# Patient Record
Sex: Female | Born: 2000 | Race: White | Hispanic: No | Marital: Single | Smoking: Never smoker
Health system: Southern US, Community
[De-identification: ages and names within clinical notes are randomized; demographics above are authoritative.]

## PROBLEM LIST (undated history)

## (undated) DIAGNOSIS — F909 Attention-deficit hyperactivity disorder, unspecified type: Secondary | ICD-10-CM

## (undated) DIAGNOSIS — F419 Anxiety disorder, unspecified: Secondary | ICD-10-CM

## (undated) DIAGNOSIS — G90A Postural orthostatic tachycardia syndrome (POTS): Secondary | ICD-10-CM

## (undated) DIAGNOSIS — K219 Gastro-esophageal reflux disease without esophagitis: Secondary | ICD-10-CM

## (undated) DIAGNOSIS — A048 Other specified bacterial intestinal infections: Secondary | ICD-10-CM

## (undated) DIAGNOSIS — Q796 Ehlers-Danlos syndrome, unspecified: Secondary | ICD-10-CM

## (undated) DIAGNOSIS — R011 Cardiac murmur, unspecified: Secondary | ICD-10-CM

## (undated) DIAGNOSIS — D649 Anemia, unspecified: Secondary | ICD-10-CM

## (undated) DIAGNOSIS — E039 Hypothyroidism, unspecified: Secondary | ICD-10-CM

## (undated) DIAGNOSIS — H539 Unspecified visual disturbance: Secondary | ICD-10-CM

## (undated) DIAGNOSIS — I2541 Coronary artery aneurysm: Secondary | ICD-10-CM

## (undated) DIAGNOSIS — J45909 Unspecified asthma, uncomplicated: Secondary | ICD-10-CM

## (undated) DIAGNOSIS — F32A Depression, unspecified: Secondary | ICD-10-CM

## (undated) DIAGNOSIS — E221 Hyperprolactinemia: Secondary | ICD-10-CM

## (undated) DIAGNOSIS — T7840XA Allergy, unspecified, initial encounter: Secondary | ICD-10-CM

## (undated) DIAGNOSIS — I253 Aneurysm of heart: Secondary | ICD-10-CM

## (undated) DIAGNOSIS — L509 Urticaria, unspecified: Secondary | ICD-10-CM

## (undated) DIAGNOSIS — R000 Tachycardia, unspecified: Secondary | ICD-10-CM

## (undated) DIAGNOSIS — I951 Orthostatic hypotension: Secondary | ICD-10-CM

## (undated) DIAGNOSIS — F329 Major depressive disorder, single episode, unspecified: Secondary | ICD-10-CM

## (undated) DIAGNOSIS — I498 Other specified cardiac arrhythmias: Secondary | ICD-10-CM

## (undated) HISTORY — DX: Urticaria, unspecified: L50.9

## (undated) HISTORY — PX: MOUTH SURGERY: SHX715

## (undated) HISTORY — PX: HYMENECTOMY: SHX987

---
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
## 2018-01-04 ENCOUNTER — Ambulatory Visit: Payer: 59 | Attending: Orthopedic Surgery | Admitting: Physical Therapy

## 2018-01-04 ENCOUNTER — Encounter: Payer: Self-pay | Admitting: Physical Therapy

## 2018-01-04 DIAGNOSIS — M62830 Muscle spasm of back: Secondary | ICD-10-CM | POA: Diagnosis not present

## 2018-01-04 DIAGNOSIS — M546 Pain in thoracic spine: Secondary | ICD-10-CM | POA: Diagnosis not present

## 2018-01-04 DIAGNOSIS — M542 Cervicalgia: Secondary | ICD-10-CM

## 2018-01-04 NOTE — Therapy (Signed)
[place], [place], [address] Phone: [phone]   Fax:  [phone]  Physical Therapy Treatment  Patient Details  Name: Christine [name] MRN: [id number] Date of Birth: February 01, 2001 Referring Provider: Lynann Bologna   Encounter Date: 01/04/2018  PT End of Session - 01/04/18 0841    Visit Number  3    Date for PT Re-Evaluation  02/07/18    PT Start Time  0800    PT Stop Time  0856    PT Time Calculation (min)  56 min    Activity Tolerance  Patient tolerated treatment well    Behavior During Therapy  [facility] for tasks assessed/performed       Past Medical History:  Diagnosis Date  . ADHD   . Allergy   . Anemia    reports history of slight anemia  . Anxiety   . Asthma   . Coronary artery fistula    by echo per note 12/23/16 at [facility]  . Depression   . Ehlers-Danlos syndrome    diagnosed x61monthago  . GERD (gastroesophageal reflux disease)   . H. pylori infection   . Heart murmur   . [place]   . Hypothyroidism   . POTS (postural orthostatic tachycardia syndrome)    mother reports that this was mainly when she was younger  . Vision abnormalities     Past Surgical History:  Procedure Laterality Date  . MOUTH SURGERY      There were no vitals filed for this visit.  Subjective Assessment - 01/04/18 0802    Subjective  "My shoulders hurt kinda"    Currently in Pain?  Yes    Pain Score  3     Pain Location  Shoulder and bottom of neck    Pain Orientation  Right                       [facility] Adult PT Treatment/Exercise - 01/04/18 0001      Neck Exercises: Machines for Strengthening   UBE (Upper Arm Bike)  L1 396f/3rev      Neck Exercises: Standing   Other Standing Exercises  shoulder ER yellow tband 2x10     Other Standing Exercises  shoulder flex 1lb 2x10      Neck Exercises: Seated   Shoulder Shrugs  20 reps 2sec hold     Other  Seated Exercise  ab sets with Pball 2x10 2sec hold     Other Seated Exercise  OHP 2lb 3x5       Modalities   Modalities  Electrical Stimulation;Moist Heat      Electrical Stimulation   Electrical Stimulation Location  upper thoracic area    Electrical Stimulation Action  IFC    Electrical Stimulation Parameters  supine    Electrical Stimulation Goals  Pain      Manual Therapy   Soft tissue mobilization  poisterior cervical para spinales    Manual Traction  cervical spine 3x10''               PT Short Term Goals - 12/08/17 1645      PT SHORT TERM GOAL #1   Title  independent with initial HEP    Time  2    Period  Weeks    Status  New        PT Long Term Goals - 01/04/18 0844      PT LONG  TERM GOAL #1   Title  decrease pain 50%    Status  Partially Met      PT LONG TERM GOAL #2   Title  understand proper posture and body mechanics    Status  Partially Met      PT LONG TERM GOAL #3   Title  demonstrate good sitting posture for 5 minutes    Status  On-going      PT LONG TERM GOAL #4   Title  increase cervical ROM to WNL's    Status  On-going            Plan - 01/04/18 0842    Clinical Impression Statement  Pt is very deconditioned and demos core weakness. Increase in lumbar lordosis with standing shoulder flexion, cues given to stand erect but unable to do. No increase in pain throughout only increase fatigue with light resistance. Pliable muscles noted  during MT.     Rehab Potential  Good    PT Frequency  2x / week    PT Duration  8 weeks    PT Treatment/Interventions  ADLs/Self Care Home Management;Electrical Stimulation;Moist Heat;Therapeutic exercise;Therapeutic activities;Patient/family education;Manual techniques    PT Next Visit Plan  slowly start stabilization exercises       Patient will benefit from skilled therapeutic intervention in order to improve the following deficits and impairments:  Decreased range of motion, Increased muscle  spasms, Pain, Impaired flexibility, Decreased strength, Postural dysfunction  Visit Diagnosis: Cervicalgia  Pain in thoracic spine  Muscle spasm of back     Problem List Patient Active Problem List   Diagnosis Date Noted  . Tension headache 06/05/2014  . Migraine without aura and without status migrainosus, not intractable 06/05/2014  . Generalized anxiety disorder 06/05/2014  . MDD (major depressive disorder), single episode, severe ([place]) 07/04/2013  . GAD (generalized anxiety disorder) 07/04/2013    Scot Jun, PTA 01/04/2018, 8:45 AM  [place], [place], [address] Phone: [phone]   Fax:  [phone]  Name: Romayne Ticas MRN: [id number] Date of Birth: [birth year]

## 2018-01-05 ENCOUNTER — Ambulatory Visit: Payer: 59 | Admitting: Physical Therapy

## 2018-01-05 DIAGNOSIS — K900 Celiac disease: Secondary | ICD-10-CM | POA: Diagnosis not present

## 2018-01-05 DIAGNOSIS — Z713 Dietary counseling and surveillance: Secondary | ICD-10-CM | POA: Diagnosis not present

## 2018-01-05 DIAGNOSIS — R768 Other specified abnormal immunological findings in serum: Secondary | ICD-10-CM | POA: Diagnosis not present

## 2018-01-11 ENCOUNTER — Ambulatory Visit: Payer: 59 | Admitting: Physical Therapy

## 2018-01-11 ENCOUNTER — Encounter: Payer: Self-pay | Admitting: Physical Therapy

## 2018-01-11 DIAGNOSIS — M542 Cervicalgia: Secondary | ICD-10-CM | POA: Diagnosis not present

## 2018-01-11 DIAGNOSIS — M546 Pain in thoracic spine: Secondary | ICD-10-CM

## 2018-01-11 NOTE — Therapy (Signed)
[place], [place], [address] Phone: [phone]   Fax:  [phone]  Physical Therapy Treatment  Patient Details  Name: Christine [name] MRN: [id number] Date of Birth: [birth year] Referring Provider: Lynann Bologna   Encounter Date: 01/11/2018  PT End of Session - 01/11/18 1645    Visit Number  4    Date for PT Re-Evaluation  02/07/18    PT Start Time  1600    PT Stop Time  1655    PT Time Calculation (min)  55 min    Activity Tolerance  Patient tolerated treatment well    Behavior During Therapy  [facility] for tasks assessed/performed       Past Medical History:  Diagnosis Date  . ADHD   . Allergy   . Anemia    reports history of slight anemia  . Anxiety   . Asthma   . Coronary artery fistula    by echo per note 12/23/16 at [facility]  . Depression   . Ehlers-Danlos syndrome    diagnosed x10monthago  . GERD (gastroesophageal reflux disease)   . H. pylori infection   . Heart murmur   . [place]   . Hypothyroidism   . POTS (postural orthostatic tachycardia syndrome)    mother reports that this was mainly when she was younger  . Vision abnormalities     Past Surgical History:  Procedure Laterality Date  . MOUTH SURGERY      There were no vitals filed for this visit.  Subjective Assessment - 01/11/18 1603    Subjective  "My shoulders are achy"    Currently in Pain?  Yes    Pain Score  4     Pain Location  Shoulder    Pain Orientation  Right                       [facility] Adult PT Treatment/Exercise - 01/11/18 0001      Neck Exercises: Machines for Strengthening   UBE (Upper Arm Bike)  L1 394f/3rev    Other Machines for Strengthening  Rows & lats 10lb 2x10       Neck Exercises: Standing   Other Standing Exercises  straight arm pull downs 5lb 2x10    Other Standing Exercises  Rows 10lb 2x10       Neck Exercises: Seated   Shoulder  Shrugs  20 reps 2 sec, 3lb      Modalities   Modalities  Electrical Stimulation;Moist Heat      Moist Heat Therapy   Number Minutes Moist Heat  15 Minutes    Moist Heat Location  Cervical      Electrical Stimulation   Electrical Stimulation Location  upper thoracic area    Electrical Stimulation Action  IFC    Electrical Stimulation Parameters  supine    Electrical Stimulation Goals  Pain               PT Short Term Goals - 12/08/17 1645      PT SHORT TERM GOAL #1   Title  independent with initial HEP    Time  2    Period  Weeks    Status  New        PT Long Term Goals - 01/04/18 0844      PT LONG TERM GOAL #1   Title  decrease pain 50%    Status  Partially Met  PT LONG TERM GOAL #2   Title  understand proper posture and body mechanics    Status  Partially Met      PT LONG TERM GOAL #3   Title  demonstrate good sitting posture for 5 minutes    Status  On-going      PT LONG TERM GOAL #4   Title  increase cervical ROM to WNL's    Status  On-going            Plan - 01/11/18 1646    Clinical Impression Statement  patient enters clinic reporting increase fatigue. She is very weak and deconditioned. Forward rounded shoulders. Constant postural verbal and tactile cues required throughout session. She was able to perform all of today's exercises with very little resistance. Demos core weakness with straight arm pull downs.     Rehab Potential  Good    PT Frequency  2x / week    PT Treatment/Interventions  ADLs/Self Care Home Management;Electrical Stimulation;Moist Heat;Therapeutic exercise;Therapeutic activities;Patient/family education;Manual techniques    PT Next Visit Plan   stabilization exercises       Patient will benefit from skilled therapeutic intervention in order to improve the following deficits and impairments:  Decreased range of motion, Increased muscle spasms, Pain, Impaired flexibility, Decreased strength, Postural  dysfunction  Visit Diagnosis: Cervicalgia  Pain in thoracic spine     Problem List Patient Active Problem List   Diagnosis Date Noted  . Tension headache 06/05/2014  . Migraine without aura and without status migrainosus, not intractable 06/05/2014  . Generalized anxiety disorder 06/05/2014  . MDD (major depressive disorder), single episode, severe ([place]) 07/04/2013  . GAD (generalized anxiety disorder) 07/04/2013    Scot Jun, PTA 01/11/2018, 4:49 PM  [place], [place], [address] Phone: [phone]   Fax:  [phone]  Name: Christine [name] MRN: [id number] Date of Birth: [birth year]

## 2018-01-12 DIAGNOSIS — J45909 Unspecified asthma, uncomplicated: Secondary | ICD-10-CM | POA: Diagnosis not present

## 2018-01-18 ENCOUNTER — Ambulatory Visit (INDEPENDENT_AMBULATORY_CARE_PROVIDER_SITE_OTHER): Payer: 59 | Admitting: Allergy and Immunology

## 2018-01-18 ENCOUNTER — Ambulatory Visit: Payer: 59 | Admitting: Physical Therapy

## 2018-01-18 ENCOUNTER — Encounter: Payer: Self-pay | Admitting: Allergy and Immunology

## 2018-01-18 VITALS — BP 90/60 | HR 100 | Temp 98.5°F | Resp 20 | Ht 65.1 in | Wt 106.2 lb

## 2018-01-18 DIAGNOSIS — J4541 Moderate persistent asthma with (acute) exacerbation: Secondary | ICD-10-CM

## 2018-01-18 DIAGNOSIS — T7800XD Anaphylactic reaction due to unspecified food, subsequent encounter: Secondary | ICD-10-CM

## 2018-01-18 DIAGNOSIS — J4550 Severe persistent asthma, uncomplicated: Secondary | ICD-10-CM | POA: Insufficient documentation

## 2018-01-18 DIAGNOSIS — T7800XA Anaphylactic reaction due to unspecified food, initial encounter: Secondary | ICD-10-CM | POA: Insufficient documentation

## 2018-01-18 DIAGNOSIS — H1013 Acute atopic conjunctivitis, bilateral: Secondary | ICD-10-CM | POA: Diagnosis not present

## 2018-01-18 DIAGNOSIS — H1010 Acute atopic conjunctivitis, unspecified eye: Secondary | ICD-10-CM | POA: Insufficient documentation

## 2018-01-18 DIAGNOSIS — J3089 Other allergic rhinitis: Secondary | ICD-10-CM | POA: Diagnosis not present

## 2018-01-18 MED ORDER — AZELASTINE HCL 0.15 % NA SOLN: 1.0000 | Freq: Two times a day (BID) | NASAL | 5 refills | Status: DC

## 2018-01-18 MED ORDER — EPINEPHRINE 0.3 MG/0.3ML IJ SOAJ: 0.3000 mg | Freq: Once | INTRAMUSCULAR | 2 refills | Status: AC

## 2018-01-18 MED ORDER — PREDNISOLONE SODIUM PHOSPHATE 25 MG/5ML PO SOLN: ORAL | 0 refills | Status: DC

## 2018-01-18 MED ORDER — BUDESONIDE-FORMOTEROL FUMARATE 160-4.5 MCG/ACT IN AERO: 2.0000 | INHALATION_SPRAY | Freq: Two times a day (BID) | RESPIRATORY_TRACT | 5 refills | Status: DC

## 2018-01-18 MED ORDER — OLOPATADINE HCL 0.2 % OP SOLN: 1.0000 [drp] | OPHTHALMIC | 5 refills | Status: DC

## 2018-01-18 MED ORDER — CARBINOXAMINE MALEATE 4 MG PO TABS: 4.0000 mg | ORAL_TABLET | Freq: Four times a day (QID) | ORAL | 0 refills | Status: DC

## 2018-01-18 NOTE — Patient Instructions (Addendum)
Perennial and seasonal allergic rhinitis  Aeroallergen avoidance measures have been discussed and provided in written form.  A prescription has been provided for carbinoxamine 4 mg every 6-8 hours if needed.  A prescription has been provided for azelastine nasal spray, 1-2 sprays per nostril 2 times daily as needed. Proper nasal spray technique has been discussed and demonstrated.   Nasal saline spray (i.e., Simply Saline) or nasal saline lavage (i.e., NeilMed) is recommended as needed and prior to medicated nasal sprays.  If allergen avoidance measures and medications fail to adequately relieve symptoms, aeroallergen immunotherapy will be considered.  Allergic conjunctivitis  Treatment plan as outlined above for allergic rhinitis.  A prescription has been provided for Pataday, one drop per eye daily as needed.  I have also recommended eye lubricant drops (i.e., Natural Tears) as needed.  Moderate persistent asthma A prescription has been provided for prednisolone 15 mg/5 mL; 5 mL 3 times a day 3 days, then 5 mL twice a day on day 4, then 5 mL on day 5, then stop.  A prescription has been provided for Symbicort (budesonide/formoterol) 160/4.5 g,  2 inhalations twice a day. To maximize pulmonary deposition, a spacer has been provided along with instructions for its proper administration with an HFA inhaler.  Continue albuterol HFA, 1 to 2 inhalations every 6 hours if needed.  The patient has been asked to contact me if her symptoms persist or progress. Otherwise, she may return for follow up in 3 months.  Food allergy The patient's history suggests food allergy and positive skin test results today confirm this diagnosis.  Meticulous avoidance of tree nuts and coconut as discussed.  A prescription has been provided for epinephrine auto-injector 2 pack along with instructions for proper administration.  A food allergy action plan has been provided and discussed.  Medic Alert  identification is recommended.   Return in about 8 weeks (around 03/15/2018), or if symptoms worsen or fail to improve.  Reducing Pollen Exposure  The American Academy of Allergy, Asthma and Immunology suggests the following steps to reduce your exposure to pollen during allergy seasons.    1. Do not hang sheets or clothing out to dry; pollen may collect on these items. 2. Do not mow lawns or spend time around freshly cut grass; mowing stirs up pollen. 3. Keep windows closed at night.  Keep car windows closed while driving. 4. Minimize morning activities outdoors, a time when pollen counts are usually at their highest. 5. Stay indoors as much as possible when pollen counts or humidity is high and on windy days when pollen tends to remain in the air longer. 6. Use air conditioning when possible.  Many air conditioners have filters that trap the pollen spores. 7. Use a HEPA room air filter to remove pollen form the indoor air you breathe.   Control of House Dust Mite Allergen  House dust mites play a major role in allergic asthma and rhinitis.  They occur in environments with high humidity wherever human skin, the food for dust mites is found. High levels have been detected in dust obtained from mattresses, pillows, carpets, upholstered furniture, bed covers, clothes and soft toys.  The principal allergen of the house dust mite is found in its feces.  A gram of dust may contain 1,000 mites and 250,000 fecal particles.  Mite antigen is easily measured in the air during house cleaning activities.    1. Encase mattresses, including the box spring, and pillow, in an air tight cover.  Seal the zipper end of the encased mattresses with wide adhesive tape. 2. Wash the bedding in water of 130 degrees Farenheit weekly.  Avoid cotton comforters/quilts and flannel bedding: the most ideal bed covering is the dacron comforter. 3. Remove all upholstered furniture from the bedroom. 4. Remove carpets, carpet  padding, rugs, and non-washable window drapes from the bedroom.  Wash drapes weekly or use plastic window coverings. 5. Remove all non-washable stuffed toys from the bedroom.  Wash stuffed toys weekly. 6. Have the room cleaned frequently with a vacuum cleaner and a damp dust-mop.  The patient should not be in a room which is being cleaned and should wait 1 hour after cleaning before going into the room. 7. Close and seal all heating outlets in the bedroom.  Otherwise, the room will become filled with dust-laden air.  An electric heater can be used to heat the room. Reduce indoor humidity to less than 50%.  Do not use a humidifier.  Control of Dog or Cat Allergen  Avoidance is the best way to manage a dog or cat allergy. If you have a dog or cat and are allergic to dog or cats, consider removing the dog or cat from the home. If you have a dog or cat but don't want to find it a new home, or if your family wants a pet even though someone in the household is allergic, here are some strategies that may help keep symptoms at bay:  1. Keep the pet out of your bedroom and restrict it to only a few rooms. Be advised that keeping the dog or cat in only one room will not limit the allergens to that room. 2. Don't pet, hug or kiss the dog or cat; if you do, wash your hands with soap and water. 3. High-efficiency particulate air (HEPA) cleaners run continuously in a bedroom or living room can reduce allergen levels over time. 4. Place electrostatic material sheet in the air inlet vent in the bedroom. 5. Regular use of a high-efficiency vacuum cleaner or a central vacuum can reduce allergen levels. 6. Giving your dog or cat a bath at least once a week can reduce airborne allergen.  Control of Mold Allergen  Mold and fungi can grow on a variety of surfaces provided certain temperature and moisture conditions exist.  Outdoor molds grow on plants, decaying vegetation and soil.  The major outdoor mold, Alternaria  and Cladosporium, are found in very high numbers during hot and dry conditions.  Generally, a late Summer - Fall peak is seen for common outdoor fungal spores.  Rain will temporarily lower outdoor mold spore count, but counts rise rapidly when the rainy period ends.  The most important indoor molds are Aspergillus and Penicillium.  Dark, humid and poorly ventilated basements are ideal sites for mold growth.  The next most common sites of mold growth are the bathroom and the kitchen.  Outdoor [employer] 1. Use air conditioning and keep windows closed 2. Avoid exposure to decaying vegetation. 3. Avoid leaf raking. 4. Avoid grain handling. 5. Consider wearing a face mask if working in moldy areas.  Indoor Mold Control 1. Maintain humidity below 50%. 2. Clean washable surfaces with 5% bleach solution. 3. Remove sources e.g. Contaminated carpets.  Control of Cockroach Allergen  Cockroach allergen has been identified as an important cause of acute attacks of asthma, especially in urban settings.  There are fifty-five species of cockroach that exist in the [place], however only three, the  American, [place] and [place] species produce allergen that can affect patients with Asthma.  Allergens can be obtained from fecal particles, egg casings and secretions from cockroaches.    1. Remove food sources. 2. Reduce access to water. 3. Seal access and entry points. 4. Spray runways with 0.5-1% Diazinon or Chlorpyrifos 5. Blow boric acid power under stoves and refrigerator. 6. Place bait stations (hydramethylnon) at feeding sites.

## 2018-01-18 NOTE — Assessment & Plan Note (Signed)
The patient's history suggests food allergy and positive skin test results today confirm this diagnosis.  Meticulous avoidance of tree nuts and coconut as discussed.  A prescription has been provided for epinephrine auto-injector 2 pack along with instructions for proper administration.  A food allergy action plan has been provided and discussed.  Medic Alert identification is recommended.

## 2018-01-18 NOTE — Progress Notes (Signed)
New Patient Note  RE: Christine [name] MRN: [id number] DOB: Apr 15, 2001 Date of Office Visit: 01/18/2018  Referring provider: Hall Busing, MD Primary care provider: Hall Busing, MD  Chief Complaint: Asthma; Allergic Rhinitis ; and Allergic Reaction   History of present illness: Christine [name] is a 17 y.o. female seen today in consultation requested by Gearldine Bienenstock, MD.  She is accompanied today by her mother who assists with the history.  Hester has had symptoms consistent with asthma since she was approximately 17 years of age, including coughing, labored breathing, and wheezing.  Her asthma has progressed over the past year.  Over the past several weeks she has been requiring albuterol rescue 3 or 4 times per night despite compliance with Qvar 40 g, 2 inhalations twice daily.  Besides increased frequency of asthma symptoms at nighttime, other potential triggers include exposure to cats, dogs, and pollen.  She required evaluation and treatment in the emergency department on 2 occasions this past fall for asthma exacerbations. Lorieann experiences nasal congestion, rhinorrhea, sneezing, postnasal drainage "constantly" with throat clearing, ocular pruritus, and sinus pressure.  These symptoms occur year-round but are more frequent and severe in the springtime and in the fall.  She takes loratadine and fluticasone nasal spray in an attempt to control the symptoms with approximately 50% improvement.  When the patient was in 6 grade she consumed tree nuts and rapidly developed generalized pruritus, urticaria on the upper and lower extremities, and oropharyngeal pruritus..  She has avoided tree nuts since that time but does not have an epinephrine autoinjector..  She is able to consume peanuts on a regular basis without adverse symptoms.  Assessment and plan: Perennial and seasonal allergic rhinitis  Aeroallergen avoidance measures have been discussed and provided in written  form.  A prescription has been provided for carbinoxamine 4 mg every 6-8 hours if needed.  A prescription has been provided for azelastine nasal spray, 1-2 sprays per nostril 2 times daily as needed. Proper nasal spray technique has been discussed and demonstrated.   Nasal saline spray (i.e., Simply Saline) or nasal saline lavage (i.e., NeilMed) is recommended as needed and prior to medicated nasal sprays.  If allergen avoidance measures and medications fail to adequately relieve symptoms, aeroallergen immunotherapy will be considered.  Allergic conjunctivitis  Treatment plan as outlined above for allergic rhinitis.  A prescription has been provided for Pataday, one drop per eye daily as needed.  I have also recommended eye lubricant drops (i.e., Natural Tears) as needed.  Moderate persistent asthma A prescription has been provided for prednisolone 15 mg/5 mL; 5 mL 3 times a day 3 days, then 5 mL twice a day on day 4, then 5 mL on day 5, then stop.  A prescription has been provided for Symbicort (budesonide/formoterol) 160/4.5 g,  2 inhalations twice a day. To maximize pulmonary deposition, a spacer has been provided along with instructions for its proper administration with an HFA inhaler.  Continue albuterol HFA, 1 to 2 inhalations every 6 hours if needed.  The patient has been asked to contact me if her symptoms persist or progress. Otherwise, she may return for follow up in 3 months.  Food allergy The patient's history suggests food allergy and positive skin test results today confirm this diagnosis.  Meticulous avoidance of tree nuts and coconut as discussed.  A prescription has been provided for epinephrine auto-injector 2 pack along with instructions for proper administration.  A food allergy action plan has been  provided and discussed.  Medic Alert identification is recommended.   Meds ordered this encounter  Medications  . Carbinoxamine Maleate 4 MG TABS    Sig:  Take 1 tablet (4 mg total) by mouth every 6 (six) hours.    Dispense:  30 each    Refill:  0  . Azelastine HCl 0.15 % SOLN    Sig: Place 1-2 sprays into both nostrils 2 (two) times daily.    Dispense:  30 mL    Refill:  5  . Olopatadine HCl (PATADAY) 0.2 % SOLN    Sig: Place 1 drop into both eyes 1 day or 1 dose.    Dispense:  1 Bottle    Refill:  5  . budesonide-formoterol (SYMBICORT) 160-4.5 MCG/ACT inhaler    Sig: Inhale 2 puffs into the lungs 2 (two) times daily.    Dispense:  1 Inhaler    Refill:  5  . EPINEPHrine (AUVI-Q) 0.3 mg/0.3 mL IJ SOAJ injection    Sig: Inject 0.3 mLs (0.3 mg total) into the muscle once for 1 dose. As directed for life-threatening allergic reactions    Dispense:  4 Device    Refill:  2  . prednisoLONE Sodium Phosphate 25 MG/5ML SOLN    Sig: 15 mg/5 ml; 5 ml 3 times a day for 3 days, 5 ml twice a day on day 4, then 5 ml on day 5, Then stop.    Dispense:  15 mL    Refill:  0    Diagnostics: Spirometry: Prematurity reveals an FVC of 3.21 L (83% predicted) and an FEV1 of 2.10 L (61% predicted) with significant (460 mL, 22%) postbronchodilator improvement. Please see scanned spirometry results for details. Environmental skin testing: Positive to weed pollen, mold, cat hair, cockroach antigen, and dust mite antigen. Food allergen skin testing: Positive to cashew, pistachio, and coconut.    Physical examination: Blood pressure 90/60, pulse 100, temperature 98.5 F (36.9 C), temperature source Oral, resp. rate 20, height 5' 5.1" (1.654 m), weight 106 lb 3.2 oz (48.2 kg).  General: Alert, interactive, in no acute distress. HEENT: TMs pearly gray, turbinates moderately edematous with crusty discharge, post-pharynx moderately erythematous. Neck: Supple without lymphadenopathy. Lungs: Mildly decreased breath sounds bilaterally without wheezing, rhonchi or rales. CV: Normal S1, S2 without murmurs. Abdomen: Nondistended, nontender. Skin: Warm and dry, without  lesions or rashes. Extremities:  No clubbing, cyanosis or edema. Neuro:   Grossly intact.  Review of systems:  Review of systems negative except as noted in HPI / PMHx or noted below: Review of Systems  Constitutional: Negative.   HENT: Negative.   Eyes: Negative.   Respiratory: Negative.   Cardiovascular: Negative.   Gastrointestinal: Negative.   Genitourinary: Negative.   Musculoskeletal: Negative.   Skin: Negative.   Neurological: Negative.   Endo/Heme/Allergies: Negative.   Psychiatric/Behavioral: Negative.     Past medical history:  Past Medical History:  Diagnosis Date  . ADHD   . Allergy   . Anemia    reports history of slight anemia  . Anxiety   . Asthma   . Coronary artery fistula    by echo per note 12/23/16 at [facility]  . Depression   . Ehlers-Danlos syndrome    diagnosed x59month ago  . GERD (gastroesophageal reflux disease)   . H. pylori infection   . Heart murmur   . Hyperprolactinemia ([place])   . Hypothyroidism   . POTS (postural orthostatic tachycardia syndrome)    mother reports that this  was mainly when she was younger  . Urticaria   . Vision abnormalities     Past surgical history:  Past Surgical History:  Procedure Laterality Date  . MOUTH SURGERY      Family history: Family History  Adopted: Yes  Problem Relation Age of Onset  . Bipolar disorder Maternal Uncle   . Allergic rhinitis Neg Hx   . Asthma Neg Hx   . Eczema Neg Hx   . Urticaria Neg Hx     Social history: Social History   Socioeconomic History  . Marital status: Single    Spouse name: Not on file  . Number of children: Not on file  . Years of education: Not on file  . Highest education level: Not on file  Occupational History  . Not on file  Social Needs  . Financial resource strain: Not on file  . Food insecurity:    Worry: Not on file    Inability: Not on file  . Transportation needs:    Medical: Not on file    Non-medical: Not on file  Tobacco  Use  . Smoking status: Never Smoker  . Smokeless tobacco: Never Used  Substance and Sexual Activity  . Alcohol use: No    Alcohol/week: 0.0 oz  . Drug use: No  . Sexual activity: Never    Birth control/protection: Abstinence    Comment: insurance questions declined  Lifestyle  . Physical activity:    Days per week: Not on file    Minutes per session: Not on file  . Stress: Not on file  Relationships  . Social connections:    Talks on phone: Not on file    Gets together: Not on file    Attends religious service: Not on file    Active member of club or organization: Not on file    Attends meetings of clubs or organizations: Not on file    Relationship status: Not on file  . Intimate partner violence:    Fear of current or ex partner: Not on file    Emotionally abused: Not on file    Physically abused: Not on file    Forced sexual activity: Not on file  Other Topics Concern  . Not on file  Social History Narrative  . Not on file   Environmental History: The patient lives in a townhouse built in 1994 with carpeting throughout and central air/heat.  There are 2 cats in the home which have access to her bedroom.  She is a non-smoker.  There is no known mold/water damage in the home.  Allergies as of 01/18/2018      Reactions   Lactose Intolerance (gi)    UNSPECIFIED SEVERITY/REACTION   Other Other (See Comments)   PET DANDER SEASONAL ALLERGIES ASTHMA "TREE NUTS" >> RASH   Pollen Extract    UNSPECIFIED REACTION       Medication List        Accurate as of 01/18/18  4:51 PM. Always use your most recent med list.          albuterol 108 (90 Base) MCG/ACT inhaler Commonly known as:  PROVENTIL HFA;VENTOLIN HFA Inhale 2 puffs into the lungs every 6 (six) hours as needed for wheezing or shortness of breath.   ALPRAZolam 0.25 MG tablet Commonly known as:  XANAX Take 0.125 mg by mouth 3 (three) times daily as needed (for anxiety/panic attacks.).   Azelastine HCl 0.15 %  Soln Place 1-2 sprays into both nostrils 2 (two)  times daily.   beclomethasone 40 MCG/ACT inhaler Commonly known as:  QVAR Inhale 1 puff into the lungs 2 (two) times daily.   budesonide-formoterol 160-4.5 MCG/ACT inhaler Commonly known as:  SYMBICORT Inhale 2 puffs into the lungs 2 (two) times daily.   calcium carbonate 500 MG chewable tablet Commonly known as:  TUMS - dosed in mg elemental calcium Chew 1-2 tablets by mouth 3 (three) times daily as needed for indigestion or heartburn.   Carbinoxamine Maleate 4 MG Tabs Take 1 tablet (4 mg total) by mouth every 6 (six) hours.   drospirenone-ethinyl estradiol 3-0.02 MG tablet Commonly known as:  YAZ,GIANVI,LORYNA Take 1 tablet by mouth daily.   EPINEPHrine 0.3 mg/0.3 mL Soaj injection Commonly known as:  AUVI-Q Inject 0.3 mLs (0.3 mg total) into the muscle once for 1 dose. As directed for life-threatening allergic reactions   fluticasone 50 MCG/ACT nasal spray Commonly known as:  FLONASE Place 1 spray into both nostrils daily.   ibuprofen 200 MG tablet Commonly known as:  ADVIL,MOTRIN Take 600 mg by mouth every 8 (eight) hours as needed (for pain.).   levothyroxine 50 MCG tablet Commonly known as:  SYNTHROID, LEVOTHROID Take 50 mcg by mouth daily before breakfast.   loratadine 10 MG tablet Commonly known as:  CLARITIN Take 10 mg by mouth daily.   nortriptyline 10 MG capsule Commonly known as:  PAMELOR Take 10 mg by mouth at bedtime.   Olopatadine HCl 0.2 % Soln Commonly known as:  PATADAY Place 1 drop into both eyes 1 day or 1 dose.   prednisoLONE Sodium Phosphate 25 MG/5ML Soln 15 mg/5 ml; 5 ml 3 times a day for 3 days, 5 ml twice a day on day 4, then 5 ml on day 5, Then stop. Start taking on:  01/19/2018   QUILLIVANT XR 25 MG/5ML Susr Generic drug:  Methylphenidate HCl ER Take 25 mg by mouth daily as needed (for concentration).       Known medication allergies: Allergies  Allergen Reactions  . Lactose  Intolerance (Gi)     UNSPECIFIED SEVERITY/REACTION  . Other Other (See Comments)    PET DANDER SEASONAL ALLERGIES ASTHMA "TREE NUTS" >> RASH  . Pollen Extract     UNSPECIFIED REACTION     I appreciate the opportunity to take part in Tyrihanna's care. Please do not hesitate to contact me with questions.  Sincerely,   R. Edgar Frisk, MD

## 2018-01-18 NOTE — Assessment & Plan Note (Signed)
   Aeroallergen avoidance measures have been discussed and provided in written form.  A prescription has been provided for carbinoxamine 4 mg every 6-8 hours if needed.  A prescription has been provided for azelastine nasal spray, 1-2 sprays per nostril 2 times daily as needed. Proper nasal spray technique has been discussed and demonstrated.   Nasal saline spray (i.e., Simply Saline) or nasal saline lavage (i.e., NeilMed) is recommended as needed and prior to medicated nasal sprays.  If allergen avoidance measures and medications fail to adequately relieve symptoms, aeroallergen immunotherapy will be considered.

## 2018-01-18 NOTE — Assessment & Plan Note (Signed)
   Treatment plan as outlined above for allergic rhinitis.  A prescription has been provided for Pataday, one drop per eye daily as needed.  I have also recommended eye lubricant drops (i.e., Natural Tears) as needed. 

## 2018-01-18 NOTE — Assessment & Plan Note (Addendum)
A prescription has been provided for prednisolone 15 mg/5 mL; 5 mL 3 times a day 3 days, then 5 mL twice a day on day 4, then 5 mL on day 5, then stop.  A prescription has been provided for Symbicort (budesonide/formoterol) 160/4.5 g, 2 inhalations twice a day. To maximize pulmonary deposition, a spacer has been provided along with instructions for its proper administration with an HFA inhaler.  Continue albuterol HFA, 1 to 2 inhalations every 6 hours if needed.  The patient has been asked to contact me if her symptoms persist or progress. Otherwise, she may return for follow up in 3 months.

## 2018-01-21 ENCOUNTER — Telehealth: Payer: Self-pay | Admitting: *Deleted

## 2018-01-21 NOTE — Telephone Encounter (Signed)
The mother called and stated that her Asthma has not improved since her visit with [place] this week. Per instruction from Dr. Nelva Bush she suggests that she continue use of her Albuterol Inhaler 2 puffs every 4 hours and if it is not improving at night then to call us back this weekend to speak to the provider on call and visit an Urgent Care to receive treatment.

## 2018-01-24 ENCOUNTER — Telehealth: Payer: Self-pay | Admitting: Allergy and Immunology

## 2018-01-24 MED ORDER — PREDNISOLONE 15 MG/5ML PO SYRP: ORAL_SOLUTION | ORAL | 0 refills | Status: DC

## 2018-01-24 NOTE — Telephone Encounter (Signed)
Dr. Verlin Fester can you please advise and thank you

## 2018-01-24 NOTE — Telephone Encounter (Signed)
Patient was seen last week - still not feeling well Used inhaler every 4 hours as told Has finished prednisone What can she do now??  Also patients nasal spray burns her nose  Can she continue her flonase??

## 2018-01-24 NOTE — Telephone Encounter (Signed)
Called patients mother with information.  Called in prednisolone to pharmacy.

## 2018-01-24 NOTE — Telephone Encounter (Signed)
Is she taking the Symbicort 160-4.5 g, 2 inhalations via spacer device twice daily?  If so, and her asthma symptoms are persisting, we will extend the prednisolone taper as follows: prednisolone 15 mg/5 mL; 5 mL twice a day 3 days, then 5 mL on day 4, then 2.5 mL on day 5, then stop. If her asthma symptoms persist or progress despite treatment plan as outlined above, have her schedule to be seen in 1 of our offices.  Thank you.

## 2018-02-01 ENCOUNTER — Ambulatory Visit (INDEPENDENT_AMBULATORY_CARE_PROVIDER_SITE_OTHER): Payer: 59 | Admitting: Allergy and Immunology

## 2018-02-01 ENCOUNTER — Other Ambulatory Visit: Payer: 59

## 2018-02-01 ENCOUNTER — Encounter: Payer: Self-pay | Admitting: Allergy and Immunology

## 2018-02-01 VITALS — BP 80/60 | HR 125 | Temp 97.7°F | Resp 18 | Ht 65.0 in | Wt 109.4 lb

## 2018-02-01 DIAGNOSIS — I959 Hypotension, unspecified: Secondary | ICD-10-CM | POA: Insufficient documentation

## 2018-02-01 DIAGNOSIS — T7800XD Anaphylactic reaction due to unspecified food, subsequent encounter: Secondary | ICD-10-CM

## 2018-02-01 DIAGNOSIS — R000 Tachycardia, unspecified: Secondary | ICD-10-CM | POA: Diagnosis not present

## 2018-02-01 DIAGNOSIS — J4551 Severe persistent asthma with (acute) exacerbation: Secondary | ICD-10-CM | POA: Diagnosis not present

## 2018-02-01 DIAGNOSIS — J3089 Other allergic rhinitis: Secondary | ICD-10-CM | POA: Diagnosis not present

## 2018-02-01 DIAGNOSIS — R0609 Other forms of dyspnea: Secondary | ICD-10-CM

## 2018-02-01 DIAGNOSIS — I9589 Other hypotension: Secondary | ICD-10-CM

## 2018-02-01 MED ORDER — TIOTROPIUM BROMIDE MONOHYDRATE 1.25 MCG/ACT IN AERS: 2.0000 | INHALATION_SPRAY | Freq: Every day | RESPIRATORY_TRACT | 3 refills | Status: DC

## 2018-02-01 MED ORDER — MONTELUKAST SODIUM 10 MG PO TABS: 10.0000 mg | ORAL_TABLET | Freq: Every day | ORAL | 3 refills | Status: DC

## 2018-02-01 MED ORDER — MOMETASONE FURO-FORMOTEROL FUM 200-5 MCG/ACT IN AERO: 2.0000 | INHALATION_SPRAY | Freq: Two times a day (BID) | RESPIRATORY_TRACT | 3 refills | Status: DC

## 2018-02-01 NOTE — Assessment & Plan Note (Signed)
   Christine [name] has been asked to see her primary care physician as soon as possible that this problem can be followed and further evaluated.

## 2018-02-01 NOTE — Patient Instructions (Addendum)
Severe persistent asthma  An order has been provided for a chest x-ray, PA and lateral.  Patient will be called when results of returned.  A lab order form has been provided for CBC with differential and total IgE level to assess candidacy for biologic agents.  A sample and prescription have been provided for Dulera 200-5 g, 2 inhalations via spacer device twice daily.  Discontinue Symbicort.  A prescription has been provided for Spiriva Respimat 1.25 g, 2 inhalations daily.  A prescription has been provided for montelukast 10 mg daily at bedtime.  If symptoms persist or progress despite treatment plan as outlined above she will be referred to pulmonology for further evaluation and treatment.  Hypotension and tachycardia  Christine [name] has been asked to see her primary care physician as soon as possible that this problem can be followed and further evaluated.  Perennial and seasonal allergic rhinitis  Continue appropriate allergen avoidance measures, azelastine nasal spray if needed, nasal saline irrigation as needed.  Food allergy  Continue meticulous avoidance of tree nuts and coconut and have access to epinephrine autoinjector 2 pack in case of accidental ingestion.  Food allergy action plan is in place.   Return in about 4 months (around 06/04/2018), or if symptoms worsen or fail to improve.

## 2018-02-01 NOTE — Assessment & Plan Note (Signed)
   Continue appropriate allergen avoidance measures, azelastine nasal spray if needed, nasal saline irrigation as needed.

## 2018-02-01 NOTE — Progress Notes (Signed)
Follow-up Note  RE: Christine [name] MRN: [id number] DOB: 10/03/2000 Date of Office Visit: 02/01/2018  Primary care provider: Hall Busing, MD Referring provider: Hall Busing, MD  History of present illness: Christine [name] is a 17 y.o. female with a complicated medical history including persistent asthma, allergic rhinitis, and food allergy presenting today for a sick visit.  She was previously seen in this clinic for her initial evaluation on January 18, 2018.  She is accompanied today by her mother who assists with the history.  She reports that after having taken the prednisone and starting the Symbicort 160-4.5 g, 2 inhalations via spacer twice twice daily, that her asthma symptoms have improved mildly.  She had been experiencing nocturnal awakenings due to lower respiratory symptoms 4 or more times per night, currently she is experiencing nocturnal awakenings due to asthma symptoms 2 times per night on average.  She is still experiencing shortness of breath several times per day despite compliance with the Symbicort.  She denies fevers, chills, and discolored mucus production.  She states that recently she has been fatigued throughout the days.  She has no specific nasal or sinus symptom complaints today.  Assessment and plan: Severe persistent asthma  An order has been provided for a chest x-ray, PA and lateral.  Patient will be called when results of returned.  A lab order form has been provided for CBC with differential and total IgE level to assess candidacy for biologic agents.  A sample and prescription have been provided for Dulera 200-5 g, 2 inhalations via spacer device twice daily.  Discontinue Symbicort.  A prescription has been provided for Spiriva Respimat 1.25 g, 2 inhalations daily.  A prescription has been provided for montelukast 10 mg daily at bedtime.  If symptoms persist or progress despite treatment plan as outlined above she will be  referred to pulmonology for further evaluation and treatment.  Hypotension and tachycardia  Ronesha has been asked to see her primary care physician as soon as possible that this problem can be followed and further evaluated.  Perennial and seasonal allergic rhinitis  Continue appropriate allergen avoidance measures, azelastine nasal spray if needed, nasal saline irrigation as needed.  Food allergy  Continue meticulous avoidance of tree nuts and coconut and have access to epinephrine autoinjector 2 pack in case of accidental ingestion.  Food allergy action plan is in place.   Meds ordered this encounter  Medications  . mometasone-formoterol (DULERA) 200-5 MCG/ACT AERO    Sig: Inhale 2 puffs into the lungs 2 (two) times daily.    Dispense:  1 Inhaler    Refill:  3  . Tiotropium Bromide Monohydrate (SPIRIVA RESPIMAT) 1.25 MCG/ACT AERS    Sig: Inhale 2 puffs into the lungs daily.    Dispense:  4 g    Refill:  3  . montelukast (SINGULAIR) 10 MG tablet    Sig: Take 1 tablet (10 mg total) by mouth at bedtime.    Dispense:  30 tablet    Refill:  3    Diagnostics: Spirometry reveals an FVC of 3.71 L and an FEV1 of 2.78 L (81% predicted) with 190 mL postbronchodilator improvement.  Please see scanned spirometry results for details.    Physical examination: Blood pressure (!) 80/60, pulse (!) 125, temperature 97.7 F (36.5 C), temperature source Oral, resp. rate 18, height 5\' 5"  (1.651 m), weight 109 lb 6.4 oz (49.6 kg), SpO2 98 %.  General: Alert, interactive, thin, pale, in no  acute distress. HEENT: TMs pearly gray, turbinates mildly edematous without discharge, post-pharynx unremarkable. Neck: Supple without lymphadenopathy. Lungs: Clear to auscultation without wheezing, rhonchi or rales. CV: Normal S1, S2 without murmurs. Skin: Warm and dry, without lesions or rashes.  The following portions of the patient's history were reviewed and updated as appropriate: allergies, current  medications, past family history, past medical history, past social history, past surgical history and problem list.  Allergies as of 02/01/2018      Reactions   Lactose Intolerance (gi)    UNSPECIFIED SEVERITY/REACTION   Other Other (See Comments)   PET DANDER SEASONAL ALLERGIES ASTHMA "TREE NUTS" >> RASH   Pollen Extract    UNSPECIFIED REACTION       Medication List        Accurate as of 02/01/18 11:34 AM. Always use your most recent med list.          albuterol 108 (90 Base) MCG/ACT inhaler Commonly known as:  PROVENTIL HFA;VENTOLIN HFA Inhale 2 puffs into the lungs every 6 (six) hours as needed for wheezing or shortness of breath.   ALPRAZolam 0.25 MG tablet Commonly known as:  XANAX Take 0.125 mg by mouth 3 (three) times daily as needed (for anxiety/panic attacks.).   beclomethasone 40 MCG/ACT inhaler Commonly known as:  QVAR Inhale 1 puff into the lungs 2 (two) times daily.   budesonide-formoterol 160-4.5 MCG/ACT inhaler Commonly known as:  SYMBICORT Inhale 2 puffs into the lungs 2 (two) times daily.   calcium carbonate 500 MG chewable tablet Commonly known as:  TUMS - dosed in mg elemental calcium Chew 1-2 tablets by mouth 3 (three) times daily as needed for indigestion or heartburn.   Carbinoxamine Maleate 4 MG Tabs Take 1 tablet (4 mg total) by mouth every 6 (six) hours.   drospirenone-ethinyl estradiol 3-0.02 MG tablet Commonly known as:  YAZ,GIANVI,LORYNA Take 1 tablet by mouth daily.   fluticasone 50 MCG/ACT nasal spray Commonly known as:  FLONASE Place 1 spray into both nostrils daily.   ibuprofen 200 MG tablet Commonly known as:  ADVIL,MOTRIN Take 600 mg by mouth every 8 (eight) hours as needed (for pain.).   levothyroxine 50 MCG tablet Commonly known as:  SYNTHROID, LEVOTHROID Take 50 mcg by mouth daily before breakfast.   loratadine 10 MG tablet Commonly known as:  CLARITIN Take 10 mg by mouth daily.   mometasone-formoterol 200-5 MCG/ACT  Aero Commonly known as:  DULERA Inhale 2 puffs into the lungs 2 (two) times daily.   montelukast 10 MG tablet Commonly known as:  SINGULAIR Take 1 tablet (10 mg total) by mouth at bedtime.   nortriptyline 10 MG capsule Commonly known as:  PAMELOR Take 10 mg by mouth at bedtime.   Olopatadine HCl 0.2 % Soln Commonly known as:  PATADAY Place 1 drop into both eyes 1 day or 1 dose.   QUILLIVANT XR 25 MG/5ML Susr Generic drug:  Methylphenidate HCl ER Take 25 mg by mouth daily as needed (for concentration).   Tiotropium Bromide Monohydrate 1.25 MCG/ACT Aers Commonly known as:  SPIRIVA RESPIMAT Inhale 2 puffs into the lungs daily.       Allergies  Allergen Reactions  . Lactose Intolerance (Gi)     UNSPECIFIED SEVERITY/REACTION  . Other Other (See Comments)    PET DANDER SEASONAL ALLERGIES ASTHMA "TREE NUTS" >> RASH  . Pollen Extract     UNSPECIFIED REACTION    Review of systems: Review of systems negative except as noted in HPI / PMHx  or noted below: Constitutional: Negative.  HENT: Negative.   Eyes: Negative.  Respiratory: Negative.   Cardiovascular: Negative.  Gastrointestinal: Negative.  Genitourinary: Negative.  Musculoskeletal: Negative.  Neurological: Negative.  Endo/Heme/Allergies: Negative.  Cutaneous: Negative.  Past Medical History:  Diagnosis Date  . ADHD   . Allergy   . Anemia    reports history of slight anemia  . Anxiety   . Asthma   . Coronary artery fistula    by echo per note 12/23/16 at [facility]  . Depression   . Ehlers-Danlos syndrome    diagnosed x29month ago  . GERD (gastroesophageal reflux disease)   . H. pylori infection   . Heart murmur   . Hyperprolactinemia ([place])   . Hypothyroidism   . POTS (postural orthostatic tachycardia syndrome)    mother reports that this was mainly when she was younger  . Urticaria   . Vision abnormalities     Family History  Adopted: Yes  Problem Relation Age of Onset  . Bipolar  disorder Maternal Uncle   . Allergic rhinitis Neg Hx   . Asthma Neg Hx   . Eczema Neg Hx   . Urticaria Neg Hx     Social History   Socioeconomic History  . Marital status: Single    Spouse name: Not on file  . Number of children: Not on file  . Years of education: Not on file  . Highest education level: Not on file  Occupational History  . Not on file  Social Needs  . Financial resource strain: Not on file  . Food insecurity:    Worry: Not on file    Inability: Not on file  . Transportation needs:    Medical: Not on file    Non-medical: Not on file  Tobacco Use  . Smoking status: Never Smoker  . Smokeless tobacco: Never Used  Substance and Sexual Activity  . Alcohol use: No    Alcohol/week: 0.0 oz  . Drug use: No  . Sexual activity: Never    Birth control/protection: Abstinence    Comment: insurance questions declined  Lifestyle  . Physical activity:    Days per week: Not on file    Minutes per session: Not on file  . Stress: Not on file  Relationships  . Social connections:    Talks on phone: Not on file    Gets together: Not on file    Attends religious service: Not on file    Active member of club or organization: Not on file    Attends meetings of clubs or organizations: Not on file    Relationship status: Not on file  . Intimate partner violence:    Fear of current or ex partner: Not on file    Emotionally abused: Not on file    Physically abused: Not on file    Forced sexual activity: Not on file  Other Topics Concern  . Not on file  Social History Narrative  . Not on file    I appreciate the opportunity to take part in Marit's care. Please do not hesitate to contact me with questions.  Sincerely,   R. Edgar Frisk, MD

## 2018-02-01 NOTE — Assessment & Plan Note (Signed)
   Continue meticulous avoidance of tree nuts and coconut and have access to epinephrine autoinjector 2 pack in case of accidental ingestion.  Food allergy action plan is in place. 

## 2018-02-01 NOTE — Assessment & Plan Note (Signed)
   An order has been provided for a chest x-ray, PA and lateral.  Patient will be called when results of returned.  A lab order form has been provided for CBC with differential and total IgE level to assess candidacy for biologic agents.  A sample and prescription have been provided for Dulera 200-5 g, 2 inhalations via spacer device twice daily.  Discontinue Symbicort.  A prescription has been provided for Spiriva Respimat 1.25 g, 2 inhalations daily.  A prescription has been provided for montelukast 10 mg daily at bedtime.  If symptoms persist or progress despite treatment plan as outlined above she will be referred to pulmonology for further evaluation and treatment.

## 2018-02-02 ENCOUNTER — Telehealth: Payer: Self-pay | Admitting: Allergy and Immunology

## 2018-02-02 DIAGNOSIS — B370 Candidal stomatitis: Secondary | ICD-10-CM | POA: Diagnosis not present

## 2018-02-02 DIAGNOSIS — R031 Nonspecific low blood-pressure reading: Secondary | ICD-10-CM | POA: Diagnosis not present

## 2018-02-02 MED ORDER — FLUTICASONE-SALMETEROL 230-21 MCG/ACT IN AERO: 2.0000 | INHALATION_SPRAY | Freq: Two times a day (BID) | RESPIRATORY_TRACT | 5 refills | Status: DC

## 2018-02-02 NOTE — Telephone Encounter (Signed)
Please advise 

## 2018-02-02 NOTE — Telephone Encounter (Signed)
Mom called back and I informed her of the medication change. She understood and is very Archivist.

## 2018-02-02 NOTE — Telephone Encounter (Signed)
Rx has been sent in. 

## 2018-02-02 NOTE — Telephone Encounter (Signed)
I called the pharmacy and they stated the only on they know of is Symbicort, he said they only other one he believes would be covered is Advair.

## 2018-02-02 NOTE — Telephone Encounter (Signed)
Called and left message for patient to call office in regards to this matter. 

## 2018-02-02 NOTE — Telephone Encounter (Signed)
Ok, let's try Advair 230-53mcg HFA, 2 inhalations via spacer device bid. Thanks.

## 2018-02-02 NOTE — Telephone Encounter (Signed)
Patient was seen yesterday, 02-01-18. A prescription was sent for [facility]. Mom went to pick it up and insurance will not pay for it. It was $300. She wants to know if there is an alternative. [facility].

## 2018-02-02 NOTE — Telephone Encounter (Signed)
What does her insurance cover regarding dual acting inhalers? Thanks.

## 2018-02-03 ENCOUNTER — Ambulatory Visit
Admission: RE | Admit: 2018-02-03 | Discharge: 2018-02-03 | Disposition: A | Discharge status: Home / Self Care | Payer: 59 | Source: Ambulatory Visit | Attending: Allergy and Immunology | Admitting: Allergy and Immunology

## 2018-02-03 ENCOUNTER — Encounter: Payer: Self-pay | Admitting: Radiology

## 2018-02-03 DIAGNOSIS — J4551 Severe persistent asthma with (acute) exacerbation: Secondary | ICD-10-CM

## 2018-02-03 DIAGNOSIS — M545 Low back pain: Secondary | ICD-10-CM | POA: Diagnosis not present

## 2018-02-03 DIAGNOSIS — M542 Cervicalgia: Secondary | ICD-10-CM | POA: Diagnosis not present

## 2018-02-03 DIAGNOSIS — R0609 Other forms of dyspnea: Secondary | ICD-10-CM

## 2018-02-03 DIAGNOSIS — R0602 Shortness of breath: Secondary | ICD-10-CM | POA: Diagnosis not present

## 2018-02-09 ENCOUNTER — Other Ambulatory Visit: Payer: Self-pay | Admitting: Allergy

## 2018-02-09 MED ORDER — CARBINOXAMINE MALEATE 4 MG PO TABS: 4.0000 mg | ORAL_TABLET | Freq: Four times a day (QID) | ORAL | 0 refills | Status: DC

## 2018-02-10 ENCOUNTER — Ambulatory Visit: Payer: 59 | Attending: Orthopedic Surgery | Admitting: Physical Therapy

## 2018-02-10 DIAGNOSIS — M546 Pain in thoracic spine: Secondary | ICD-10-CM | POA: Diagnosis not present

## 2018-02-10 DIAGNOSIS — M542 Cervicalgia: Secondary | ICD-10-CM | POA: Insufficient documentation

## 2018-02-10 DIAGNOSIS — M62830 Muscle spasm of back: Secondary | ICD-10-CM | POA: Insufficient documentation

## 2018-02-10 NOTE — Therapy (Signed)
[place], [place], [address] Phone: [phone]   Fax:  [phone]  Physical Therapy Treatment  Patient Details  Name: Christine [name] MRN: [id number] Date of Birth: 09-03-2000 Referring Provider: Lynann Bologna   Encounter Date: 02/10/2018  PT End of Session - 02/10/18 1512    Visit Number  5    Date for PT Re-Evaluation  02/07/18    PT Start Time  1007    PT Stop Time  1525    PT Time Calculation (min)  49 min       Past Medical History:  Diagnosis Date  . ADHD   . Allergy   . Anemia    reports history of slight anemia  . Anxiety   . Asthma   . Coronary artery fistula    by echo per note 12/23/16 at [facility]  . Depression   . Ehlers-Danlos syndrome    diagnosed x33monthago  . GERD (gastroesophageal reflux disease)   . H. pylori infection   . Heart murmur   . Hyperprolactinemia ([place]   . Hypothyroidism   . POTS (postural orthostatic tachycardia syndrome)    mother reports that this was mainly when she was younger  . Urticaria   . Vision abnormalities     Past Surgical History:  Procedure Laterality Date  . MOUTH SURGERY      There were no vitals filed for this visit.  Subjective Assessment - 02/10/18 1439    Subjective  Pt reports feeling tired and having pain in her shoulders.     Currently in Pain?  Yes    Pain Score  5     Pain Location  Back    Pain Onset  More than a month ago                       [facility]      Neck Exercises: Machines for Strengthening   UBE (Upper Arm Bike)  L1 374f/3rev    Other Machines for Strengthening  Rows & lats 10lb 2x10       Neck Exercises: Theraband   Other Theraband Exercises  Band pull aparts red theraband 2x10      Neck Exercises: Standing   Other Standing Exercises  straight arm pull downs 5lb 2x10      Neck Exercises: Seated   Shoulder Shrugs  20  reps 4 lb. DB 2sec hold    Other Seated Exercise  OHP 2lb 2x 10      Modalities   Modalities  Electrical Stimulation;Moist Heat      Moist Heat Therapy   Number Minutes Moist Heat  15 Minutes    Moist Heat Location  Cervical      Electrical Stimulation   Electrical Stimulation Location  upper thoracic area    Electrical Stimulation Action  IFC    Electrical Stimulation Parameters  supine    Electrical Stimulation Goals  Pain      Manual Therapy   Soft tissue mobilization  poisterior cervical para spinales and upper traps    Manual Traction  cervical spine 3x10''               PT Short Term Goals - 12/08/17 1645      PT SHORT TERM GOAL #1   Title  independent with initial HEP    Time  2    Period  Weeks    Status  New        PT Long Term Goals - 01/04/18 0844      PT LONG TERM GOAL #1   Title  decrease pain 50%    Status  Partially Met      PT LONG TERM GOAL #2   Title  understand proper posture and body mechanics    Status  Partially Met      PT LONG TERM GOAL #3   Title  demonstrate good sitting posture for 5 minutes    Status  On-going      PT LONG TERM GOAL #4   Title  increase cervical ROM to WNL's    Status  On-going            Plan - 02/10/18 1513    Clinical Impression Statement  Pt reported being very fatigued at onset of visit, but tolerated treatment well. Pt displays weakness across cervical and upper back postural muscles while performing exercises. Constant verbal and tactile cues required to complete exercises with proper alignment. Pt stated she spend a lot of time playing video games and on her phone with her head and shoulders slumped.     Rehab Potential  Good    PT Frequency  2x / week    PT Duration  8 weeks    PT Treatment/Interventions  ADLs/Self Care Home Management;Electrical Stimulation;Moist Heat;Therapeutic exercise;Therapeutic activities;Patient/family education;Manual techniques    PT Next Visit Plan   stabilization  exercises, posture       Patient will benefit from skilled therapeutic intervention in order to improve the following deficits and impairments:  Decreased range of motion, Increased muscle spasms, Pain, Impaired flexibility, Decreased strength, Postural dysfunction  Visit Diagnosis: Cervicalgia  Pain in thoracic spine  Muscle spasm of back     Problem List Patient Active Problem List   Diagnosis Date Noted  . Hypotension and tachycardia 02/01/2018  . Tachycardia 02/01/2018  . Perennial and seasonal allergic rhinitis 01/18/2018  . Allergic conjunctivitis 01/18/2018  . Severe persistent asthma 01/18/2018  . Food allergy 01/18/2018  . Tension headache 06/05/2014  . Migraine without aura and without status migrainosus, not intractable 06/05/2014  . Generalized anxiety disorder 06/05/2014  . MDD (major depressive disorder), single episode, severe ([place]) 07/04/2013  . GAD (generalized anxiety disorder) 07/04/2013    Maryelizabeth Kaufmann, SPTA 02/10/2018, 3:18 PM  [place], [place], [address] Phone: [phone]   Fax:  [phone]  Name: Christine [name] MRN: [id number] Date of Birth: 2001-03-19

## 2018-02-15 ENCOUNTER — Telehealth: Payer: Self-pay | Admitting: Allergy and Immunology

## 2018-02-15 NOTE — Telephone Encounter (Signed)
Please advise 

## 2018-02-15 NOTE — Telephone Encounter (Signed)
Please refer to the pulmonologist (physician NOT NP) at [facility] . Thanks.

## 2018-02-15 NOTE — Telephone Encounter (Signed)
Patient is still not feeling well  Patient still having to use emergency inhaler Mom states that Bobbitt wanted to refer out to a pulmonary physician If referral is necessary mom would want patient to go to [facility] on [address] in [place] There is only one physician there and two NP's

## 2018-02-16 NOTE — Telephone Encounter (Signed)
Please advise 

## 2018-02-17 ENCOUNTER — Ambulatory Visit: Payer: 59 | Admitting: Physical Therapy

## 2018-02-21 NOTE — Telephone Encounter (Signed)
Dyspnea not responding to asthma medications.

## 2018-02-21 NOTE — Telephone Encounter (Signed)
Patient is scheduled with Dr Camillo Flaming for August 20th at 8:40. Patient is to arrive 15 mins early. [address] [place] [address]. Mom has been notified and will call to see if she can be put on a CX list.

## 2018-02-21 NOTE — Telephone Encounter (Signed)
I have reviewed your notes Dr Verlin Fester and I am unsure what the patients diagnosis is for the referral. Please Advise .  Thanks

## 2018-02-21 NOTE — Telephone Encounter (Signed)
Noted  

## 2018-02-23 ENCOUNTER — Ambulatory Visit: Payer: 59 | Admitting: Physical Therapy

## 2018-02-23 DIAGNOSIS — M542 Cervicalgia: Secondary | ICD-10-CM

## 2018-02-23 DIAGNOSIS — M62830 Muscle spasm of back: Secondary | ICD-10-CM

## 2018-02-23 DIAGNOSIS — M546 Pain in thoracic spine: Secondary | ICD-10-CM

## 2018-02-23 NOTE — Therapy (Signed)
[place], [place], [address] Phone: [phone]   Fax:  [phone]  Physical Therapy Treatment  Patient Details  Name: Christine [name] MRN: [id number] Date of Birth: [birth year] Referring Provider: Lynann Bologna   Encounter Date: 02/23/2018  PT End of Session - 02/23/18 0932    Visit Number  6    Date for PT Re-Evaluation  02/07/18    PT Start Time  1602    PT Stop Time  1657    PT Time Calculation (min)  55 min    Activity Tolerance  Patient tolerated treatment well    Behavior During Therapy  [facility] for tasks assessed/performed       Past Medical History:  Diagnosis Date  . ADHD   . Allergy   . Anemia    reports history of slight anemia  . Anxiety   . Asthma   . Coronary artery fistula    by echo per note 12/23/16 at [facility]  . Depression   . Ehlers-Danlos syndrome    diagnosed x74monthago  . GERD (gastroesophageal reflux disease)   . H. pylori infection   . Heart murmur   . [place]   . Hypothyroidism   . POTS (postural orthostatic tachycardia syndrome)    mother reports that this was mainly when she was younger  . Urticaria   . Vision abnormalities     Past Surgical History:  Procedure Laterality Date  . MOUTH SURGERY      There were no vitals filed for this visit.  Subjective Assessment - 02/23/18 1606    Subjective  Pt reports being tired and having pain in the neck. Pt stated she has been playing video games today.     Currently in Pain?  Yes    Pain Score  4     Pain Location  Neck                       [facility] Adult PT Treatment/Exercise - 02/23/18 0001      Neck Exercises: Machines for Strengthening   UBE (Upper Arm Bike)  L1 381f/3rev    Other Machines for Strengthening  Rows 15lb. 2x10, Lats 10lb. 2x10      Neck Exercises: Theraband   Other Theraband Exercises  Band pull aparts red theraband 2x10      Neck  Exercises: Standing   Wall Wash  yellow theraband 2x10     Other Standing Exercises  straight arm pull downs 5lb 2x10    Other Standing Exercises  Rows 15lb 2x10       Neck Exercises: Seated   Shoulder Shrugs  20 reps    Other Seated Exercise  OHP seated two handed/one DB       Modalities   Modalities  Electrical Stimulation;Moist Heat      Moist Heat Therapy   Number Minutes Moist Heat  15 Minutes    Moist Heat Location  Cervical      Electrical Stimulation   Electrical Stimulation Location  upper thoracic area    Electrical Stimulation Action  IFC    Electrical Stimulation Parameters  supine    Electrical Stimulation Goals  Pain               PT Short Term Goals - 12/08/17 1645      PT SHORT TERM GOAL #1   Title  independent with initial HEP  Time  2    Period  Weeks    Status  New        PT Long Term Goals - 01/04/18 0844      PT LONG TERM GOAL #1   Title  decrease pain 50%    Status  Partially Met      PT LONG TERM GOAL #2   Title  understand proper posture and body mechanics    Status  Partially Met      PT LONG TERM GOAL #3   Title  demonstrate good sitting posture for 5 minutes    Status  On-going      PT LONG TERM GOAL #4   Title  increase cervical ROM to WNL's    Status  On-going            Plan - 02/23/18 1645    Clinical Impression Statement  Pt requires constant verbal and tactile cues for posture and alignment during exercise. Pt stated she had been playing video games and laying around all day. Pt tolerated a slight increase in weight used on resisted rows. SPTA discussed trying to remain active outside of therapy with pt and potentially getting a gym membership. Pt stated she would like to start exercise but she remain contemplative. Pt stated she feels better after IFC and moist heat.     PT Frequency  2x / week    PT Duration  8 weeks    PT Treatment/Interventions  ADLs/Self Care Home Management;Electrical Stimulation;Moist  Heat;Therapeutic exercise;Therapeutic activities;Patient/family education;Manual techniques    PT Next Visit Plan   stabilization exercises, posture       Patient will benefit from skilled therapeutic intervention in order to improve the following deficits and impairments:  Decreased range of motion, Increased muscle spasms, Pain, Impaired flexibility, Decreased strength, Postural dysfunction  Visit Diagnosis: Cervicalgia  Pain in thoracic spine  Muscle spasm of back     Problem List Patient Active Problem List   Diagnosis Date Noted  . Hypotension and tachycardia 02/01/2018  . Tachycardia 02/01/2018  . Perennial and seasonal allergic rhinitis 01/18/2018  . Allergic conjunctivitis 01/18/2018  . Severe persistent asthma 01/18/2018  . Food allergy 01/18/2018  . Tension headache 06/05/2014  . Migraine without aura and without status migrainosus, not intractable 06/05/2014  . Generalized anxiety disorder 06/05/2014  . MDD (major depressive disorder), single episode, severe ([place]) 07/04/2013  . GAD (generalized anxiety disorder) 07/04/2013    Maryelizabeth Kaufmann, SPTA 02/23/2018, 4:50 PM  [place], [place], [address] Phone: [phone]   Fax:  [phone]  Name: Christine [name] MRN: [id number] Date of Birth: [birth year]

## 2018-02-28 DIAGNOSIS — J4550 Severe persistent asthma, uncomplicated: Secondary | ICD-10-CM | POA: Diagnosis not present

## 2018-03-02 ENCOUNTER — Ambulatory Visit: Payer: 59 | Attending: Orthopedic Surgery | Admitting: Physical Therapy

## 2018-03-02 ENCOUNTER — Encounter: Payer: Self-pay | Admitting: Physical Therapy

## 2018-03-02 DIAGNOSIS — M546 Pain in thoracic spine: Secondary | ICD-10-CM | POA: Diagnosis not present

## 2018-03-02 DIAGNOSIS — M62830 Muscle spasm of back: Secondary | ICD-10-CM | POA: Diagnosis not present

## 2018-03-02 DIAGNOSIS — M542 Cervicalgia: Secondary | ICD-10-CM | POA: Insufficient documentation

## 2018-03-02 NOTE — Therapy (Signed)
[place], [place], [address] Phone: [phone]   Fax:  [phone]  Physical Therapy Treatment  Patient Details  Name: Christine [name] MRN: [id number] Date of Birth: 2001/07/09 Referring Provider: Lynann Bologna   Encounter Date: 03/02/2018  PT End of Session - 03/02/18 1640    Visit Number  7    PT Start Time  1600    PT Stop Time  9390    PT Time Calculation (min)  50 min    Activity Tolerance  Patient tolerated treatment well    Behavior During Therapy  [facility] for tasks assessed/performed       Past Medical History:  Diagnosis Date  . ADHD   . Allergy   . Anemia    reports history of slight anemia  . Anxiety   . Asthma   . Coronary artery fistula    by echo per note 12/23/16 at [facility]  . Depression   . Ehlers-Danlos syndrome    diagnosed x65monthago  . GERD (gastroesophageal reflux disease)   . H. pylori infection   . Heart murmur   . [place]prolactinemia ([place]   . Hypothyroidism   . POTS (postural orthostatic tachycardia syndrome)    mother reports that this was mainly when she was younger  . Urticaria   . Vision abnormalities     Past Surgical History:  Procedure Laterality Date  . MOUTH SURGERY      There were no vitals filed for this visit.  Subjective Assessment - 03/02/18 1606    Subjective  "Im just tired"     Currently in Pain?  No/denies    Pain Score  0-No pain         [facility] PT Assessment - 03/02/18 0001      AROM   Overall AROM Comments  cervical ROM WFL, some pain repoted with L rotation and R side bend                   [facility] Adult PT Treatment/Exercise - 03/02/18 0001      Neck Exercises: Machines for Strengthening   UBE (Upper Arm Bike)  L2 351f/3rev    Other Machines for Strengthening  Rows 15lb. 2x10, Lats 10lb. 2x10      Neck Exercises: Theraband   Other Theraband Exercises  Band pull aparts red theraband 2x10      Neck Exercises: Standing   Other Standing Exercises  straight arm pull downs 5lb 2x10    Other Standing Exercises  Rows 15lb 2x10       Modalities   Modalities  Electrical Stimulation;Moist Heat      Moist Heat Therapy   Number Minutes Moist Heat  15 Minutes    Moist Heat Location  Cervical      Electrical Stimulation   Electrical Stimulation Location  upper thoracic area    Electrical Stimulation Action  IFC    Electrical Stimulation Parameters  supine    Electrical Stimulation Goals  Pain               PT Short Term Goals - 12/08/17 1645      PT SHORT TERM GOAL #1   Title  independent with initial HEP    Time  2    Period  Weeks    Status  New        PT Long Term Goals - 03/02/18 1628      PT LONG  TERM GOAL #1   Title  decrease pain 50%    Status  Achieved      PT LONG TERM GOAL #2   Title  understand proper posture and body mechanics    Status  Achieved      PT LONG TERM GOAL #3   Title  demonstrate good sitting posture for 5 minutes    Status  Partially Met      PT LONG TERM GOAL #4   Title  increase cervical ROM to WNL's    Status  Achieved            Plan - 03/02/18 1641    Clinical Impression Statement  Pt has progressed and met some LTG's. She reports some improvement overall. Constant postural cues throughout treatment for posture and alignment. Again pt reports that she does her exercises but really isn't active outside PT. Encourage pt to be more active,    PT Frequency  2x / week    PT Duration  8 weeks    PT Treatment/Interventions  ADLs/Self Care Home Management;Electrical Stimulation;Moist Heat;Therapeutic exercise;Therapeutic activities;Patient/family education;Manual techniques    PT Next Visit Plan   stabilization exercises, posture    PT Home Exercise Plan  Tband Rows and Extensions.       Patient will benefit from skilled therapeutic intervention in order to improve the following deficits and impairments:  Decreased range of  motion, Increased muscle spasms, Pain, Impaired flexibility, Decreased strength, Postural dysfunction  Visit Diagnosis: Cervicalgia  Pain in thoracic spine  Muscle spasm of back     Problem List Patient Active Problem List   Diagnosis Date Noted  . Hypotension and tachycardia 02/01/2018  . Tachycardia 02/01/2018  . Perennial and seasonal allergic rhinitis 01/18/2018  . Allergic conjunctivitis 01/18/2018  . Severe persistent asthma 01/18/2018  . Food allergy 01/18/2018  . Tension headache 06/05/2014  . Migraine without aura and without status migrainosus, not intractable 06/05/2014  . Generalized anxiety disorder 06/05/2014  . MDD (major depressive disorder), single episode, severe ([place]) 07/04/2013  . GAD (generalized anxiety disorder) 07/04/2013    Scot Jun, PTA 03/02/2018, 4:45 PM  [place], [place], [address] Phone: [phone]   Fax:  [phone]  Name: Arcadia Gorgas MRN: [id number] Date of Birth: Nov 03, 2000

## 2018-03-08 ENCOUNTER — Ambulatory Visit: Payer: 59 | Admitting: Physical Therapy

## 2018-03-08 ENCOUNTER — Encounter: Payer: Self-pay | Admitting: Physical Therapy

## 2018-03-08 DIAGNOSIS — M546 Pain in thoracic spine: Secondary | ICD-10-CM

## 2018-03-08 DIAGNOSIS — M62830 Muscle spasm of back: Secondary | ICD-10-CM

## 2018-03-08 DIAGNOSIS — M542 Cervicalgia: Secondary | ICD-10-CM

## 2018-03-08 NOTE — Therapy (Addendum)
[place], [place], [address] Phone: [phone]   Fax:  [phone]  Physical Therapy Treatment  Patient Details  Name: Christine [name] MRN: [id number] Date of Birth: 13-May-2001 Referring Provider: Lynann Bologna   Encounter Date: 03/08/2018  PT End of Session - 03/08/18 1553    Visit Number  8    Date for PT Re-Evaluation  03/10/18    PT Start Time  1513    PT Stop Time  1607    PT Time Calculation (min)  54 min    Activity Tolerance  Patient tolerated treatment well    Behavior During Therapy  [facility] for tasks assessed/performed       Past Medical History:  Diagnosis Date  . ADHD   . Allergy   . Anemia    reports history of slight anemia  . Anxiety   . Asthma   . Coronary artery fistula    by echo per note 12/23/16 at [facility]  . Depression   . Ehlers-Danlos syndrome    diagnosed x51monthago  . GERD (gastroesophageal reflux disease)   . H. pylori infection   . Heart murmur   . [place]   . Hypothyroidism   . POTS (postural orthostatic tachycardia syndrome)    mother reports that this was mainly when she was younger  . Urticaria   . Vision abnormalities     Past Surgical History:  Procedure Laterality Date  . MOUTH SURGERY      There were no vitals filed for this visit.  Subjective Assessment - 03/08/18 1513    Subjective  "Im tired"    Currently in Pain?  Yes    Pain Score  4     Pain Location  Neck                       [facility] Adult PT Treatment/Exercise - 03/08/18 0001      Exercises   Exercises  Neck      Neck Exercises: Machines for Strengthening   UBE (Upper Arm Bike)  L3 337f/3rev    Other Machines for Strengthening  Rows 15lb. 2x15, Lats 10lb. 2x15      Neck Exercises: Theraband   Shoulder Extension  20 reps;Red    Rows  Red;20 reps      Neck Exercises: Seated   Other Seated Exercise  OHP 2lb 2x 10      Neck Exercises: Supine   Neck Retraction  10 reps;3 secs      Modalities   Modalities  Electrical Stimulation;Moist Heat      Moist Heat Therapy   Number Minutes Moist Heat  15 Minutes    Moist Heat Location  Cervical      Electrical Stimulation   Electrical Stimulation Location  upper thoracic area    Electrical Stimulation Action  IFC    Electrical Stimulation Parameters  supine    Electrical Stimulation Goals  Pain      Manual Therapy   Soft tissue mobilization  poisterior cervical para spinales and upper traps    Manual Traction  cervical spine 5x10''               PT Short Term Goals - 12/08/17 1645      PT SHORT TERM GOAL #1   Title  independent with initial HEP    Time  2    Period  Weeks  Status  New        PT Long Term Goals - 03/08/18 1553      PT LONG TERM GOAL #1   Title  decrease pain 50%    Status  Achieved      PT LONG TERM GOAL #2   Title  understand proper posture and body mechanics    Status  Achieved      PT LONG TERM GOAL #3   Title  demonstrate good sitting posture for 5 minutes    Status  Partially Met      PT LONG TERM GOAL #4   Title  increase cervical ROM to WNL's    Status  New            Plan - 03/08/18 1554    Clinical Impression Statement  Pt enters clinic reporting neck pain only. Did well with 3 way cervical retraction with [place]. No tightness noted in cervical para spinales. Pt still has poor posture but can correct with cues. Cues needed to contract core during stand rows and extensions. She only reports doing her updated HEP once.     Rehab Potential  Good    PT Frequency  2x / week    PT Treatment/Interventions  ADLs/Self Care Home Management;Electrical Stimulation;Moist Heat;Therapeutic exercise;Therapeutic activities;Patient/family education;Manual techniques    PT Next Visit Plan   stabilization exercises, posture       Patient will benefit from skilled therapeutic intervention in order to improve the  following deficits and impairments:  Decreased range of motion, Increased muscle spasms, Pain, Impaired flexibility, Decreased strength, Postural dysfunction  Visit Diagnosis: Cervicalgia  Pain in thoracic spine  Muscle spasm of back     Problem List Patient Active Problem List   Diagnosis Date Noted  . Hypotension and tachycardia 02/01/2018  . Tachycardia 02/01/2018  . Perennial and seasonal allergic rhinitis 01/18/2018  . Allergic conjunctivitis 01/18/2018  . Severe persistent asthma 01/18/2018  . Food allergy 01/18/2018  . Tension headache 06/05/2014  . Migraine without aura and without status migrainosus, not intractable 06/05/2014  . Generalized anxiety disorder 06/05/2014  . MDD (major depressive disorder), single episode, severe ([place]) 07/04/2013  . GAD (generalized anxiety disorder) 07/04/2013   PHYSICAL THERAPY DISCHARGE SUMMARY  Visits from Start of Care: 8  Plan: Patient agrees to discharge.  Patient goals were partially met. Patient is being discharged due to not returning since the last visit.  ?????     Scot Jun, PTA 03/08/2018, 3:57 PM  [place], [place], [address] Phone: [phone]   Fax:  [phone]  Name: Leeona Mccardle MRN: [id number] Date of Birth: Oct 04, 2000

## 2018-03-09 DIAGNOSIS — R1084 Generalized abdominal pain: Secondary | ICD-10-CM | POA: Diagnosis not present

## 2018-03-09 DIAGNOSIS — K219 Gastro-esophageal reflux disease without esophagitis: Secondary | ICD-10-CM | POA: Diagnosis not present

## 2018-03-09 DIAGNOSIS — R1310 Dysphagia, unspecified: Secondary | ICD-10-CM | POA: Diagnosis not present

## 2018-03-11 DIAGNOSIS — J4550 Severe persistent asthma, uncomplicated: Secondary | ICD-10-CM | POA: Diagnosis not present

## 2018-03-11 DIAGNOSIS — E063 Autoimmune thyroiditis: Secondary | ICD-10-CM | POA: Diagnosis not present

## 2018-03-11 DIAGNOSIS — E038 Other specified hypothyroidism: Secondary | ICD-10-CM | POA: Diagnosis not present

## 2018-03-15 DIAGNOSIS — K259 Gastric ulcer, unspecified as acute or chronic, without hemorrhage or perforation: Secondary | ICD-10-CM | POA: Diagnosis not present

## 2018-03-15 DIAGNOSIS — K3189 Other diseases of stomach and duodenum: Secondary | ICD-10-CM | POA: Diagnosis not present

## 2018-03-15 DIAGNOSIS — K209 Esophagitis, unspecified: Secondary | ICD-10-CM | POA: Diagnosis not present

## 2018-03-15 DIAGNOSIS — R1013 Epigastric pain: Secondary | ICD-10-CM | POA: Diagnosis not present

## 2018-03-15 DIAGNOSIS — K2950 Unspecified chronic gastritis without bleeding: Secondary | ICD-10-CM | POA: Diagnosis not present

## 2018-03-16 ENCOUNTER — Encounter: Payer: 59 | Admitting: Physical Therapy

## 2018-03-16 DIAGNOSIS — R768 Other specified abnormal immunological findings in serum: Secondary | ICD-10-CM | POA: Insufficient documentation

## 2018-03-21 ENCOUNTER — Other Ambulatory Visit: Payer: Self-pay | Admitting: Allergy and Immunology

## 2018-03-21 NOTE — Telephone Encounter (Signed)
Mom called requesting [employer]. She said this has never been prescribed, but she said Dr. Verlin Fester is aware that she might need one. [facility]. She is also having a pulmonary function test on Friday and wanted to know if after that, she needed to make an appointment for [facility].

## 2018-03-21 NOTE — Telephone Encounter (Signed)
Yes, may put in a fill for albuterol HFA. I believe she will follow up with the pulmonologist regarding breathing issues. She can follow up with [place] for the allergic rhinitis and food allergy but only needs to see [place] once a year for those issues. Thanks.

## 2018-03-21 NOTE — Telephone Encounter (Signed)
Dr Bobbitt please advise 

## 2018-03-22 MED ORDER — ALBUTEROL SULFATE HFA 108 (90 BASE) MCG/ACT IN AERS: 2.0000 | INHALATION_SPRAY | RESPIRATORY_TRACT | 0 refills | Status: DC | PRN

## 2018-03-22 NOTE — Telephone Encounter (Signed)
Spoke to patient advised as written per Dr Verlin Fester advised we would send in 1 time mother verbalized understanding.

## 2018-03-22 NOTE — Telephone Encounter (Signed)
Left message to return call 

## 2018-03-23 ENCOUNTER — Ambulatory Visit: Payer: 59 | Admitting: Physical Therapy

## 2018-03-25 DIAGNOSIS — J449 Chronic obstructive pulmonary disease, unspecified: Secondary | ICD-10-CM | POA: Diagnosis not present

## 2018-03-31 DIAGNOSIS — J4550 Severe persistent asthma, uncomplicated: Secondary | ICD-10-CM | POA: Diagnosis not present

## 2018-03-31 DIAGNOSIS — K219 Gastro-esophageal reflux disease without esophagitis: Secondary | ICD-10-CM | POA: Diagnosis not present

## 2018-04-04 ENCOUNTER — Other Ambulatory Visit: Payer: Self-pay | Admitting: Allergy and Immunology

## 2018-04-04 MED ORDER — BUDESONIDE-FORMOTEROL FUMARATE 160-4.5 MCG/ACT IN AERO: 2.0000 | INHALATION_SPRAY | Freq: Two times a day (BID) | RESPIRATORY_TRACT | 5 refills | Status: DC

## 2018-04-04 NOTE — Telephone Encounter (Signed)
Called and left message for patient to call office in regards to Symbicort refill. Rx has been sent in and will need to notify patient.

## 2018-04-04 NOTE — Telephone Encounter (Signed)
Dr. Verlin Fester it looks like we gave her a sample of Dulera in office. Please advise if okay to fill or no?

## 2018-04-04 NOTE — Telephone Encounter (Signed)
Patient was seeing pulmonologist Had given different medications/inhalers Patient feels that the medication that was prescribed by [facility] worked better for her  (( SYMBICORT )) Patient does not have to return to the pulmonologist, so that is why they are requesting the refill from [facility] Patient uses [facility]

## 2018-04-04 NOTE — Telephone Encounter (Signed)
You may refill the Symbicort 160, if that is the one she thought was helping the most.

## 2018-04-04 NOTE — Telephone Encounter (Signed)
Pt mom called back and I notified her symbicort was sent in to the pharmacy.

## 2018-04-07 DIAGNOSIS — R1031 Right lower quadrant pain: Secondary | ICD-10-CM | POA: Diagnosis not present

## 2018-04-07 DIAGNOSIS — N939 Abnormal uterine and vaginal bleeding, unspecified: Secondary | ICD-10-CM | POA: Diagnosis not present

## 2018-04-12 DIAGNOSIS — R197 Diarrhea, unspecified: Secondary | ICD-10-CM | POA: Insufficient documentation

## 2018-04-12 DIAGNOSIS — K921 Melena: Secondary | ICD-10-CM | POA: Insufficient documentation

## 2018-04-12 DIAGNOSIS — R1084 Generalized abdominal pain: Secondary | ICD-10-CM | POA: Diagnosis not present

## 2018-04-12 DIAGNOSIS — Z72820 Sleep deprivation: Secondary | ICD-10-CM | POA: Insufficient documentation

## 2018-04-12 DIAGNOSIS — R1031 Right lower quadrant pain: Secondary | ICD-10-CM | POA: Diagnosis not present

## 2018-04-12 DIAGNOSIS — R634 Abnormal weight loss: Secondary | ICD-10-CM | POA: Insufficient documentation

## 2018-04-12 DIAGNOSIS — K208 Other esophagitis: Secondary | ICD-10-CM | POA: Diagnosis not present

## 2018-04-12 DIAGNOSIS — B49 Unspecified mycosis: Secondary | ICD-10-CM | POA: Insufficient documentation

## 2018-04-12 DIAGNOSIS — K2980 Duodenitis without bleeding: Secondary | ICD-10-CM | POA: Diagnosis not present

## 2018-04-12 DIAGNOSIS — R152 Fecal urgency: Secondary | ICD-10-CM | POA: Insufficient documentation

## 2018-04-12 DIAGNOSIS — R899 Unspecified abnormal finding in specimens from other organs, systems and tissues: Secondary | ICD-10-CM | POA: Insufficient documentation

## 2018-04-12 DIAGNOSIS — G8929 Other chronic pain: Secondary | ICD-10-CM | POA: Diagnosis not present

## 2018-04-12 DIAGNOSIS — R110 Nausea: Secondary | ICD-10-CM | POA: Diagnosis not present

## 2018-04-12 DIAGNOSIS — R112 Nausea with vomiting, unspecified: Secondary | ICD-10-CM | POA: Diagnosis not present

## 2018-04-12 DIAGNOSIS — R198 Other specified symptoms and signs involving the digestive system and abdomen: Secondary | ICD-10-CM | POA: Insufficient documentation

## 2018-04-12 DIAGNOSIS — R6881 Early satiety: Secondary | ICD-10-CM | POA: Insufficient documentation

## 2018-04-13 DIAGNOSIS — G8929 Other chronic pain: Secondary | ICD-10-CM | POA: Diagnosis not present

## 2018-04-13 DIAGNOSIS — R1084 Generalized abdominal pain: Secondary | ICD-10-CM | POA: Diagnosis not present

## 2018-04-14 DIAGNOSIS — R109 Unspecified abdominal pain: Secondary | ICD-10-CM | POA: Diagnosis not present

## 2018-04-14 DIAGNOSIS — R197 Diarrhea, unspecified: Secondary | ICD-10-CM | POA: Diagnosis not present

## 2018-04-14 DIAGNOSIS — K2980 Duodenitis without bleeding: Secondary | ICD-10-CM | POA: Diagnosis not present

## 2018-04-14 DIAGNOSIS — G8929 Other chronic pain: Secondary | ICD-10-CM | POA: Diagnosis not present

## 2018-04-19 ENCOUNTER — Ambulatory Visit: Payer: 59 | Admitting: Allergy and Immunology

## 2018-04-19 DIAGNOSIS — R197 Diarrhea, unspecified: Secondary | ICD-10-CM | POA: Diagnosis not present

## 2018-04-19 DIAGNOSIS — R1084 Generalized abdominal pain: Secondary | ICD-10-CM | POA: Diagnosis not present

## 2018-04-19 DIAGNOSIS — R110 Nausea: Secondary | ICD-10-CM | POA: Diagnosis not present

## 2018-04-20 DIAGNOSIS — G8929 Other chronic pain: Secondary | ICD-10-CM | POA: Insufficient documentation

## 2018-04-30 DIAGNOSIS — J4550 Severe persistent asthma, uncomplicated: Secondary | ICD-10-CM | POA: Diagnosis not present

## 2018-05-04 ENCOUNTER — Other Ambulatory Visit: Payer: Self-pay | Admitting: Allergy

## 2018-05-04 MED ORDER — MONTELUKAST SODIUM 10 MG PO TABS: 10.0000 mg | ORAL_TABLET | Freq: Every day | ORAL | 1 refills | Status: DC

## 2018-05-05 DIAGNOSIS — M545 Low back pain: Secondary | ICD-10-CM | POA: Diagnosis not present

## 2018-05-05 DIAGNOSIS — M542 Cervicalgia: Secondary | ICD-10-CM | POA: Diagnosis not present

## 2018-05-09 DIAGNOSIS — K5900 Constipation, unspecified: Secondary | ICD-10-CM | POA: Diagnosis not present

## 2018-05-09 DIAGNOSIS — R197 Diarrhea, unspecified: Secondary | ICD-10-CM | POA: Diagnosis not present

## 2018-05-09 DIAGNOSIS — R1084 Generalized abdominal pain: Secondary | ICD-10-CM | POA: Diagnosis not present

## 2018-05-10 ENCOUNTER — Ambulatory Visit (INDEPENDENT_AMBULATORY_CARE_PROVIDER_SITE_OTHER): Payer: 59 | Admitting: Allergy and Immunology

## 2018-05-10 ENCOUNTER — Encounter: Payer: Self-pay | Admitting: Allergy and Immunology

## 2018-05-10 VITALS — BP 98/64 | HR 90 | Resp 16

## 2018-05-10 DIAGNOSIS — J4550 Severe persistent asthma, uncomplicated: Secondary | ICD-10-CM | POA: Diagnosis not present

## 2018-05-10 DIAGNOSIS — J3089 Other allergic rhinitis: Secondary | ICD-10-CM | POA: Diagnosis not present

## 2018-05-10 DIAGNOSIS — T7800XD Anaphylactic reaction due to unspecified food, subsequent encounter: Secondary | ICD-10-CM | POA: Diagnosis not present

## 2018-05-10 DIAGNOSIS — H1013 Acute atopic conjunctivitis, bilateral: Secondary | ICD-10-CM

## 2018-05-10 MED ORDER — CARBINOXAMINE MALEATE 4 MG PO TABS: 4.0000 mg | ORAL_TABLET | Freq: Four times a day (QID) | ORAL | 1 refills | Status: DC

## 2018-05-10 MED ORDER — MONTELUKAST SODIUM 10 MG PO TABS: 10.0000 mg | ORAL_TABLET | Freq: Every day | ORAL | 1 refills | Status: DC

## 2018-05-10 MED ORDER — ALBUTEROL SULFATE HFA 108 (90 BASE) MCG/ACT IN AERS: 2.0000 | INHALATION_SPRAY | RESPIRATORY_TRACT | 1 refills | Status: DC | PRN

## 2018-05-10 MED ORDER — FLUTICASONE PROPIONATE 50 MCG/ACT NA SUSP: 1.0000 | Freq: Every day | NASAL | 1 refills | Status: DC

## 2018-05-10 MED ORDER — BUDESONIDE-FORMOTEROL FUMARATE 160-4.5 MCG/ACT IN AERO: 2.0000 | INHALATION_SPRAY | Freq: Two times a day (BID) | RESPIRATORY_TRACT | 1 refills | Status: DC

## 2018-05-10 NOTE — Progress Notes (Signed)
Follow-up Note  RE: Christine [name] MRN: [id number] DOB: 07/14/2001 Date of Office Visit: 05/10/2018  Primary care provider: Hall Busing, MD Referring provider: Hall Busing, MD  History of present illness: Christine [name] is a 17 y.o. female with persistent asthma, allergic rhinitis, and food allergy presenting today for follow-up.  She was last seen in this clinic on February 01, 2018.  She is accompanied today by her mother who assists with the history.  She was recently seen by her pulmonologist, Dr. Camillo Flaming. Her mother believes that they were instructed that Christine [name]'s asthma may benefit from aeroallergen immunotherapy injections.  She was unwilling to undergo intradermal testing during her initial visit because of her fear of needles, however reports that she would be willing to do so with numbing spray.  Her asthma has been stable recently with Symbicort 160-4.5 g, 2 inhalations via spacer device twice daily, and montelukast 10 mg daily.  She has been experiencing frequent nasal congestion and rhinorrhea despite montelukast and azelastine nasal spray.  The patient and her mother her interested in the possibility of starting aeroallergen immunotherapy to reduce symptoms and decrease medication requirement.   Assessment and plan: Perennial and seasonal allergic rhinitis  Continue appropriate allergen avoidance measures.  Glucose 10 mg daily, carbinoxamine 4 mg every 6-8 hours if needed, and fluticasone nasal spray as needed.  Nasal saline spray (i.e., Simply Saline) or nasal saline lavage (i.e., NeilMed) is recommended as needed and prior to medicated nasal sprays.  She will return in the near future for intradermal testing in anticipation of initiating immunotherapy injections.  Severe persistent asthma  Continue Symbicort 160-4.5 g, 2 elations via spacer device twice daily, montelukast 10 mg daily bedtime, and albuterol HFA, 1 to 2 inhalations every 4-6 hours if  needed.  Subjective and objective measures of pulmonary function will be followed and the treatment plan will be adjusted accordingly.  Food allergy  Continue careful avoidance of tree nuts and coconut and have access to epinephrine autoinjector 2 pack in case of accidental ingestion.  Food allergy action plan is in place.   Meds ordered this encounter  Medications  . albuterol (PROVENTIL HFA;VENTOLIN HFA) 108 (90 Base) MCG/ACT inhaler    Sig: Inhale 2 puffs into the lungs every 4 (four) hours as needed for wheezing or shortness of breath.    Dispense:  3 Inhaler    Refill:  1  . budesonide-formoterol (SYMBICORT) 160-4.5 MCG/ACT inhaler    Sig: Inhale 2 puffs into the lungs 2 (two) times daily.    Dispense:  3 Inhaler    Refill:  1  . Carbinoxamine Maleate 4 MG TABS    Sig: Take 1 tablet (4 mg total) by mouth every 6 (six) hours.    Dispense:  90 each    Refill:  1  . fluticasone (FLONASE) 50 MCG/ACT nasal spray    Sig: Place 1 spray into both nostrils daily.    Dispense:  48 g    Refill:  1  . montelukast (SINGULAIR) 10 MG tablet    Sig: Take 1 tablet (10 mg total) by mouth at bedtime.    Dispense:  90 tablet    Refill:  1    Diagnostics: Spirometry reveals an FVC of 2.70 L and an FEV1 of 1.94 L (54% predicted) with an FEV1 ratio of 78%. Please see scanned spirometry for details.    Physical examination: Blood pressure 98/64, pulse 90, resp. rate 16, SpO2 97 %.  General:  Alert, interactive, in no acute distress. HEENT: TMs pearly gray, turbinates moderately edematous with clear discharge, post-pharynx moderately erythematous. Neck: Supple without lymphadenopathy. Lungs: Mildly decreased breath sounds bilaterally without wheezing, rhonchi or rales. CV: Normal S1, S2 without murmurs. Skin: Warm and dry, without lesions or rashes.  The following portions of the patient's history were reviewed and updated as appropriate: allergies, current medications, past family  history, past medical history, past social history, past surgical history and problem list.  Allergies as of 05/10/2018      Reactions   Lactose Intolerance (gi)    UNSPECIFIED SEVERITY/REACTION   Other Other (See Comments)   PET DANDER SEASONAL ALLERGIES ASTHMA "TREE NUTS" >> RASH   Pollen Extract    UNSPECIFIED REACTION       Medication List        Accurate as of 05/10/18 11:59 PM. Always use your most recent med list.          albuterol 108 (90 Base) MCG/ACT inhaler Commonly known as:  PROVENTIL HFA;VENTOLIN HFA Inhale 2 puffs into the lungs every 4 (four) hours as needed for wheezing or shortness of breath.   ALPRAZolam 0.25 MG tablet Commonly known as:  XANAX Take 0.125 mg by mouth 3 (three) times daily as needed (for anxiety/panic attacks.).   budesonide-formoterol 160-4.5 MCG/ACT inhaler Commonly known as:  SYMBICORT Inhale 2 puffs into the lungs 2 (two) times daily.   calcium carbonate 500 MG chewable tablet Commonly known as:  TUMS - dosed in mg elemental calcium Chew 1-2 tablets by mouth 3 (three) times daily as needed for indigestion or heartburn.   Carbinoxamine Maleate 4 MG Tabs Take 1 tablet (4 mg total) by mouth every 6 (six) hours.   drospirenone-ethinyl estradiol 3-0.02 MG tablet Commonly known as:  YAZ,GIANVI,LORYNA Take 1 tablet by mouth daily.   fluticasone 50 MCG/ACT nasal spray Commonly known as:  FLONASE Place 1 spray into both nostrils daily.   ibuprofen 200 MG tablet Commonly known as:  ADVIL,MOTRIN Take 600 mg by mouth every 8 (eight) hours as needed (for pain.).   levothyroxine 50 MCG tablet Commonly known as:  SYNTHROID, LEVOTHROID Take 50 mcg by mouth daily before breakfast.   montelukast 10 MG tablet Commonly known as:  SINGULAIR Take 1 tablet (10 mg total) by mouth at bedtime.   montelukast 10 MG tablet Commonly known as:  SINGULAIR Take 1 tablet (10 mg total) by mouth at bedtime.   QUILLIVANT XR 25 MG/5ML Susr Generic  drug:  Methylphenidate HCl ER Take 25 mg by mouth daily as needed (for concentration).       Allergies  Allergen Reactions  . Lactose Intolerance (Gi)     UNSPECIFIED SEVERITY/REACTION  . Other Other (See Comments)    PET DANDER SEASONAL ALLERGIES ASTHMA "TREE NUTS" >> RASH  . Pollen Extract     UNSPECIFIED REACTION    Review of systems: Review of systems negative except as noted in HPI / PMHx or noted below: Constitutional: Negative.  HENT: Negative.   Eyes: Negative.  Respiratory: Negative.   Cardiovascular: Negative.  Gastrointestinal: Negative.  Genitourinary: Negative.  Musculoskeletal: Negative.  Neurological: Negative.  Endo/Heme/Allergies: Negative.  Cutaneous: Negative.  Past Medical History:  Diagnosis Date  . ADHD   . Allergy   . Anemia    reports history of slight anemia  . Anxiety   . Asthma   . Coronary artery fistula    by echo per note 12/23/16 at [facility]  . Depression   .  Ehlers-Danlos syndrome    diagnosed x4month ago  . GERD (gastroesophageal reflux disease)   . H. pylori infection   . Heart murmur   . Hyperprolactinemia ([place])   . Hypothyroidism   . POTS (postural orthostatic tachycardia syndrome)    mother reports that this was mainly when she was younger  . Urticaria   . Vision abnormalities     Family History  Adopted: Yes  Problem Relation Age of Onset  . Bipolar disorder Maternal Uncle   . Allergic rhinitis Neg Hx   . Asthma Neg Hx   . Eczema Neg Hx   . Urticaria Neg Hx     Social History   Socioeconomic History  . Marital status: Single    Spouse name: Not on file  . Number of children: Not on file  . Years of education: Not on file  . Highest education level: Not on file  Occupational History  . Not on file  Social Needs  . Financial resource strain: Not on file  . Food insecurity:    Worry: Not on file    Inability: Not on file  . Transportation needs:    Medical: Not on file    Non-medical:  Not on file  Tobacco Use  . Smoking status: Never Smoker  . Smokeless tobacco: Never Used  Substance and Sexual Activity  . Alcohol use: No    Alcohol/week: 0.0 standard drinks  . Drug use: No  . Sexual activity: Never    Birth control/protection: Abstinence    Comment: insurance questions declined  Lifestyle  . Physical activity:    Days per week: Not on file    Minutes per session: Not on file  . Stress: Not on file  Relationships  . Social connections:    Talks on phone: Not on file    Gets together: Not on file    Attends religious service: Not on file    Active member of club or organization: Not on file    Attends meetings of clubs or organizations: Not on file    Relationship status: Not on file  . Intimate partner violence:    Fear of current or ex partner: Not on file    Emotionally abused: Not on file    Physically abused: Not on file    Forced sexual activity: Not on file  Other Topics Concern  . Not on file  Social History Narrative  . Not on file    I appreciate the opportunity to take part in Zohra's care. Please do not hesitate to contact me with questions.  Sincerely,   R. Edgar Frisk, MD

## 2018-05-12 ENCOUNTER — Encounter: Payer: Self-pay | Admitting: Allergy and Immunology

## 2018-05-12 NOTE — Assessment & Plan Note (Signed)
   Continue appropriate allergen avoidance measures.  Glucose 10 mg daily, carbinoxamine 4 mg every 6-8 hours if needed, and fluticasone nasal spray as needed.  Nasal saline spray (i.e., Simply Saline) or nasal saline lavage (i.e., NeilMed) is recommended as needed and prior to medicated nasal sprays.  She will return in the near future for intradermal testing in anticipation of initiating immunotherapy injections.

## 2018-05-12 NOTE — Assessment & Plan Note (Signed)
   Continue Symbicort 160-4.5 g, 2 elations via spacer device twice daily, montelukast 10 mg daily bedtime, and albuterol HFA, 1 to 2 inhalations every 4-6 hours if needed.  Subjective and objective measures of pulmonary function will be followed and the treatment plan will be adjusted accordingly.

## 2018-05-12 NOTE — Assessment & Plan Note (Signed)
   Continue careful avoidance of tree nuts and coconut and have access to epinephrine autoinjector 2 pack in case of accidental ingestion.  Food allergy action plan is in place.

## 2018-05-12 NOTE — Patient Instructions (Signed)
Perennial and seasonal allergic rhinitis  Continue appropriate allergen avoidance measures.  Glucose 10 mg daily, carbinoxamine 4 mg every 6-8 hours if needed, and fluticasone nasal spray as needed.  Nasal saline spray (i.e., Simply Saline) or nasal saline lavage (i.e., NeilMed) is recommended as needed and prior to medicated nasal sprays.  She will return in the near future for intradermal testing in anticipation of initiating immunotherapy injections.  Severe persistent asthma  Continue Symbicort 160-4.5 g, 2 elations via spacer device twice daily, montelukast 10 mg daily bedtime, and albuterol HFA, 1 to 2 inhalations every 4-6 hours if needed.  Subjective and objective measures of pulmonary function will be followed and the treatment plan will be adjusted accordingly.  Food allergy  Continue careful avoidance of tree nuts and coconut and have access to epinephrine autoinjector 2 pack in case of accidental ingestion.  Food allergy action plan is in place.   Return for intradermal testing.

## 2018-05-16 ENCOUNTER — Telehealth: Payer: Self-pay | Admitting: Allergy and Immunology

## 2018-05-16 NOTE — Telephone Encounter (Signed)
Christine [name] has an appointment tomorrow, 05-16-18, for an allergy test. They requested a numbing spray and mom wants to know if we were able to get it. If not, she does not see a need to come to the appointment.

## 2018-05-16 NOTE — Telephone Encounter (Signed)
Christine Malta T. Received a call from mother advised spray was ordered and we were waiting on it to come in.Christine [name] will call patient before lunch time to advise if spray arrives in our office mother verbalized understanding

## 2018-05-16 NOTE — Telephone Encounter (Signed)
Left message to return call in regards to this. Ashleigh, CMA states that numbing spray will be provided for appt

## 2018-05-17 ENCOUNTER — Encounter: Payer: Self-pay | Admitting: Allergy and Immunology

## 2018-05-17 ENCOUNTER — Ambulatory Visit (INDEPENDENT_AMBULATORY_CARE_PROVIDER_SITE_OTHER): Payer: 59 | Admitting: Allergy and Immunology

## 2018-05-17 VITALS — BP 100/62 | HR 100 | Temp 99.5°F | Resp 16

## 2018-05-17 DIAGNOSIS — J4550 Severe persistent asthma, uncomplicated: Secondary | ICD-10-CM

## 2018-05-17 DIAGNOSIS — J3089 Other allergic rhinitis: Secondary | ICD-10-CM | POA: Diagnosis not present

## 2018-05-17 DIAGNOSIS — H1013 Acute atopic conjunctivitis, bilateral: Secondary | ICD-10-CM | POA: Diagnosis not present

## 2018-05-17 DIAGNOSIS — T7800XD Anaphylactic reaction due to unspecified food, subsequent encounter: Secondary | ICD-10-CM | POA: Diagnosis not present

## 2018-05-17 NOTE — Assessment & Plan Note (Signed)
   Aeroallergen avoidance measures have been discussed and provided in written form.  Continue montelukast 10 mg daily, carbinoxamine 4 mg every 6-8 hours as needed, fluticasone nasal spray as needed, and nasal saline irrigation as needed.  The risks and benefits of aeroallergen immunotherapy have been discussed. The patient is motivated to initiate immunotherapy to reduce symptoms and decrease medication requirement. Informed consent has been signed and allergen vaccine orders have been submitted. Medications will be decreased or discontinued as symptom relief from immunotherapy becomes evident.

## 2018-05-17 NOTE — Assessment & Plan Note (Signed)
   Continue careful avoidance of tree nuts and coconut and have access to epinephrine autoinjector 2 pack in case of accidental ingestion.  Food allergy action plan is in place.

## 2018-05-17 NOTE — Progress Notes (Addendum)
Follow-up Note  RE: Christine [name] MRN: [id number] DOB: Nov 26, 2000 Date of Office Visit: 05/17/2018  Primary care provider: Hall Busing, MD Referring provider: Hall Busing, MD  History of present illness: Christine [name] is a 17 y.o. female with persistent asthma and allergic rhinitis presenting today for intradermal skin testing in anticipation of initiating immunotherapy injections.  He has been off of antihistamines for the past 3 days in anticipation of skin testing per   Assessment and plan: Perennial and seasonal allergic rhinitis  Aeroallergen avoidance measures have been discussed and provided in written form.  Continue montelukast 10 mg daily, carbinoxamine 4 mg every 6-8 hours as needed, fluticasone nasal spray as needed, and nasal saline irrigation as needed.  The risks and benefits of aeroallergen immunotherapy have been discussed. The patient is motivated to initiate immunotherapy to reduce symptoms and decrease medication requirement. Informed consent has been signed and allergen vaccine orders have been submitted. Medications will be decreased or discontinued as symptom relief from immunotherapy becomes evident.  Severe persistent asthma  Continue Symbicort 160-4.5 g, 2 inhalations via spacer device twice daily, montelukast 10 mg daily bedtime, and albuterol HFA, 1 to 2 inhalations every 4-6 hours if needed.  Subjective and objective measures of pulmonary function will be followed and the treatment plan will be adjusted accordingly.  Food allergy  Continue careful avoidance of tree nuts and coconut and have access to epinephrine autoinjector 2 pack in case of accidental ingestion.  Food allergy action plan is in place.   Diagnostics: Intradermal testing: Positive to dog epithelia and molds. Spirometry: FVC was 3.60 L and FEV1 was 2.58 L (75% predicted) with an FEV1 ratio of 83%.  Please see scanned spirometry results for details.     Physical examination: Blood pressure 100/62, pulse 100, temperature 99.5 F (37.5 C), temperature source Oral, resp. rate 16, SpO2 98 %.  General: Alert, interactive, in no acute distress. HEENT: TMs pearly gray, turbinates moderately edematous with clear discharge, post-pharynx moderately erythematous. Neck: Supple without lymphadenopathy. Lungs: Clear to auscultation without wheezing, rhonchi or rales. CV: Normal S1, S2 without murmurs. Skin: Warm and dry, without lesions or rashes.  The following portions of the patient's history were reviewed and updated as appropriate: allergies, current medications, past family history, past medical history, past social history, past surgical history and problem list.  Allergies as of 05/17/2018      Reactions   Lactose Nausea And Vomiting   Lactose intolerance   Lactose Intolerance (gi)    UNSPECIFIED SEVERITY/REACTION   Other Other (See Comments)   PET DANDER SEASONAL ALLERGIES ASTHMA "TREE NUTS" >> RASH   Pollen Extract    UNSPECIFIED REACTION    Coconut Oil Rash      Medication List        Accurate as of 05/17/18  5:59 PM. Always use your most recent med list.          albuterol 108 (90 Base) MCG/ACT inhaler Commonly known as:  PROVENTIL HFA;VENTOLIN HFA Inhale 2 puffs into the lungs every 4 (four) hours as needed for wheezing or shortness of breath.   ALPRAZolam 0.25 MG tablet Commonly known as:  XANAX Take 0.125 mg by mouth 3 (three) times daily as needed (for anxiety/panic attacks.).   budesonide-formoterol 160-4.5 MCG/ACT inhaler Commonly known as:  SYMBICORT Inhale 2 puffs into the lungs 2 (two) times daily.   calcium carbonate 500 MG chewable tablet Commonly known as:  TUMS - dosed in mg elemental  calcium Chew 1-2 tablets by mouth 3 (three) times daily as needed for indigestion or heartburn.   Carbinoxamine Maleate 4 MG Tabs Take 1 tablet (4 mg total) by mouth every 6 (six) hours.   [facility] BISMUTH 262 MG  chewable tablet Generic drug:  bismuth subsalicylate Chew by mouth.   drospirenone-ethinyl estradiol 3-0.02 MG tablet Commonly known as:  YAZ,GIANVI,LORYNA Take 1 tablet by mouth daily.   fluticasone 50 MCG/ACT nasal spray Commonly known as:  FLONASE Place 1 spray into both nostrils daily.   hydrOXYzine 25 MG tablet Commonly known as:  ATARAX/VISTARIL Take by mouth.   hyoscyamine 0.125 MG tablet Commonly known as:  LEVSIN, ANASPAZ Take by mouth.   ibuprofen 200 MG tablet Commonly known as:  ADVIL,MOTRIN Take 600 mg by mouth every 8 (eight) hours as needed (for pain.).   levothyroxine 50 MCG tablet Commonly known as:  SYNTHROID, LEVOTHROID Take 50 mcg by mouth daily before breakfast.   montelukast 10 MG tablet Commonly known as:  SINGULAIR Take 1 tablet (10 mg total) by mouth at bedtime.   ondansetron 4 MG tablet Commonly known as:  ZOFRAN Take by mouth.   pantoprazole 40 MG tablet Commonly known as:  PROTONIX Take by mouth.   QUILLIVANT XR 25 MG/5ML Susr Generic drug:  Methylphenidate HCl ER Take 25 mg by mouth daily as needed (for concentration).       Allergies  Allergen Reactions  . Lactose Nausea And Vomiting    Lactose intolerance  . Lactose Intolerance (Gi)     UNSPECIFIED SEVERITY/REACTION  . Other Other (See Comments)    PET DANDER SEASONAL ALLERGIES ASTHMA "TREE NUTS" >> RASH  . Pollen Extract     UNSPECIFIED REACTION   . Coconut Oil Rash    I appreciate the opportunity to take part in Janalyn's care. Please do not hesitate to contact me with questions.  Sincerely,   R. Edgar Frisk, MD

## 2018-05-17 NOTE — Assessment & Plan Note (Signed)
   Continue Symbicort 160-4.5 g, 2 inhalations via spacer device twice daily, montelukast 10 mg daily bedtime, and albuterol HFA, 1 to 2 inhalations every 4-6 hours if needed.  Subjective and objective measures of pulmonary function will be followed and the treatment plan will be adjusted accordingly.

## 2018-05-17 NOTE — Patient Instructions (Addendum)
Perennial and seasonal allergic rhinitis  Aeroallergen avoidance measures have been discussed and provided in written form.  Continue montelukast 10 mg daily, carbinoxamine 4 mg every 6-8 hours as needed, fluticasone nasal spray as needed, and nasal saline irrigation as needed.  The risks and benefits of aeroallergen immunotherapy have been discussed. The patient is motivated to initiate immunotherapy to reduce symptoms and decrease medication requirement. Informed consent has been signed and allergen vaccine orders have been submitted. Medications will be decreased or discontinued as symptom relief from immunotherapy becomes evident.  Severe persistent asthma  Continue Symbicort 160-4.5 g, 2 inhalations via spacer device twice daily, montelukast 10 mg daily bedtime, and albuterol HFA, 1 to 2 inhalations every 4-6 hours if needed.  Subjective and objective measures of pulmonary function will be followed and the treatment plan will be adjusted accordingly.  Food allergy  Continue careful avoidance of tree nuts and coconut and have access to epinephrine autoinjector 2 pack in case of accidental ingestion.  Food allergy action plan is in place.   Return in about 4 months (around 09/17/2018), or if symptoms worsen or fail to improve.  Control of Dust Mite Allergen  House dust mites play a major role in allergic asthma and rhinitis.  They occur in environments with high humidity wherever human skin, the food for dust mites is found. High levels have been detected in dust obtained from mattresses, pillows, carpets, upholstered furniture, bed covers, clothes and soft toys.  The principal allergen of the house dust mite is found in its feces.  A gram of dust may contain 1,000 mites and 250,000 fecal particles.  Mite antigen is easily measured in the air during house cleaning activities.    1. Encase mattresses, including the box spring, and pillow, in an air tight cover.  Seal the zipper end of  the encased mattresses with wide adhesive tape. 2. Wash the bedding in water of 130 degrees Farenheit weekly.  Avoid cotton comforters/quilts and flannel bedding: the most ideal bed covering is the dacron comforter. 3. Remove all upholstered furniture from the bedroom. 4. Remove carpets, carpet padding, rugs, and non-washable window drapes from the bedroom.  Wash drapes weekly or use plastic window coverings. 5. Remove all non-washable stuffed toys from the bedroom.  Wash stuffed toys weekly. 6. Have the room cleaned frequently with a vacuum cleaner and a damp dust-mop.  The patient should not be in a room which is being cleaned and should wait 1 hour after cleaning before going into the room. 7. Close and seal all heating outlets in the bedroom.  Otherwise, the room will become filled with dust-laden air.  An electric heater can be used to heat the room. Reduce indoor humidity to less than 50%.  Do not use a humidifier.   Reducing Pollen Exposure  The American Academy of Allergy, Asthma and Immunology suggests the following steps to reduce your exposure to pollen during allergy seasons.    1. Do not hang sheets or clothing out to dry; pollen may collect on these items. 2. Do not mow lawns or spend time around freshly cut grass; mowing stirs up pollen. 3. Keep windows closed at night.  Keep car windows closed while driving. 4. Minimize morning activities outdoors, a time when pollen counts are usually at their highest. 5. Stay indoors as much as possible when pollen counts or humidity is high and on windy days when pollen tends to remain in the air longer. 6. Use air conditioning when possible.  Many air conditioners have filters that trap the pollen spores. 7. Use a HEPA room air filter to remove pollen form the indoor air you breathe.   Control of Dog or Cat Allergen  Avoidance is the best way to manage a dog or cat allergy. If you have a dog or cat and are allergic to dog or cats,  consider removing the dog or cat from the home. If you have a dog or cat but don't want to find it a new home, or if your family wants a pet even though someone in the household is allergic, here are some strategies that may help keep symptoms at bay:  1. Keep the pet out of your bedroom and restrict it to only a few rooms. Be advised that keeping the dog or cat in only one room will not limit the allergens to that room. 2. Don't pet, hug or kiss the dog or cat; if you do, wash your hands with soap and water. 3. High-efficiency particulate air (HEPA) cleaners run continuously in a bedroom or living room can reduce allergen levels over time. 4. Place electrostatic material sheet in the air inlet vent in the bedroom. 5. Regular use of a high-efficiency vacuum cleaner or a central vacuum can reduce allergen levels. 6. Giving your dog or cat a bath at least once a week can reduce airborne allergen.   Control of Mold Allergen  Mold and fungi can grow on a variety of surfaces provided certain temperature and moisture conditions exist.  Outdoor molds grow on plants, decaying vegetation and soil.  The major outdoor mold, Alternaria and Cladosporium, are found in very high numbers during hot and dry conditions.  Generally, a late Summer - Fall peak is seen for common outdoor fungal spores.  Rain will temporarily lower outdoor mold spore count, but counts rise rapidly when the rainy period ends.  The most important indoor molds are Aspergillus and Penicillium.  Dark, humid and poorly ventilated basements are ideal sites for mold growth.  The next most common sites of mold growth are the bathroom and the kitchen.  Outdoor [employer] 1. Use air conditioning and keep windows closed 2. Avoid exposure to decaying vegetation. 3. Avoid leaf raking. 4. Avoid grain handling. 5. Consider wearing a face mask if working in moldy areas.  Indoor Mold Control 1. Maintain humidity below 50%. 2. Clean washable  surfaces with 5% bleach solution. 3. Remove sources e.g. Contaminated carpets.   Control of Cockroach Allergen  Cockroach allergen has been identified as an important cause of acute attacks of asthma, especially in urban settings.  There are fifty-five species of cockroach that exist in the [place], however only three, the [place], [place] species produce allergen that can affect patients with Asthma.  Allergens can be obtained from fecal particles, egg casings and secretions from cockroaches.    1. Remove food sources. 2. Reduce access to water. 3. Seal access and entry points. 4. Spray runways with 0.5-1% Diazinon or Chlorpyrifos 5. Blow boric acid power under stoves and refrigerator. 6. Place bait stations (hydramethylnon) at feeding sites.

## 2018-05-18 NOTE — Progress Notes (Signed)
VIALS EXP 05-20-19

## 2018-05-19 DIAGNOSIS — J301 Allergic rhinitis due to pollen: Secondary | ICD-10-CM | POA: Diagnosis not present

## 2018-05-20 DIAGNOSIS — J3089 Other allergic rhinitis: Secondary | ICD-10-CM

## 2018-05-31 ENCOUNTER — Ambulatory Visit: Payer: 59

## 2018-05-31 DIAGNOSIS — E559 Vitamin D deficiency, unspecified: Secondary | ICD-10-CM | POA: Diagnosis not present

## 2018-05-31 DIAGNOSIS — E063 Autoimmune thyroiditis: Secondary | ICD-10-CM | POA: Diagnosis not present

## 2018-05-31 DIAGNOSIS — J4550 Severe persistent asthma, uncomplicated: Secondary | ICD-10-CM | POA: Diagnosis not present

## 2018-06-07 ENCOUNTER — Ambulatory Visit: Payer: 59 | Admitting: Allergy and Immunology

## 2018-06-07 ENCOUNTER — Ambulatory Visit: Payer: 59

## 2018-06-14 ENCOUNTER — Ambulatory Visit: Payer: 59 | Admitting: Allergy and Immunology

## 2018-06-14 ENCOUNTER — Ambulatory Visit (INDEPENDENT_AMBULATORY_CARE_PROVIDER_SITE_OTHER): Payer: 59

## 2018-06-14 DIAGNOSIS — J309 Allergic rhinitis, unspecified: Secondary | ICD-10-CM | POA: Diagnosis not present

## 2018-06-21 ENCOUNTER — Ambulatory Visit (INDEPENDENT_AMBULATORY_CARE_PROVIDER_SITE_OTHER): Payer: 59

## 2018-06-21 DIAGNOSIS — J309 Allergic rhinitis, unspecified: Secondary | ICD-10-CM | POA: Diagnosis not present

## 2018-06-28 ENCOUNTER — Ambulatory Visit (INDEPENDENT_AMBULATORY_CARE_PROVIDER_SITE_OTHER): Payer: 59

## 2018-06-28 DIAGNOSIS — J309 Allergic rhinitis, unspecified: Secondary | ICD-10-CM | POA: Diagnosis not present

## 2018-06-30 DIAGNOSIS — J4550 Severe persistent asthma, uncomplicated: Secondary | ICD-10-CM | POA: Diagnosis not present

## 2018-07-04 DIAGNOSIS — R1084 Generalized abdominal pain: Secondary | ICD-10-CM | POA: Diagnosis not present

## 2018-07-04 DIAGNOSIS — R197 Diarrhea, unspecified: Secondary | ICD-10-CM | POA: Diagnosis not present

## 2018-07-04 DIAGNOSIS — R899 Unspecified abnormal finding in specimens from other organs, systems and tissues: Secondary | ICD-10-CM | POA: Diagnosis not present

## 2018-07-05 ENCOUNTER — Ambulatory Visit (INDEPENDENT_AMBULATORY_CARE_PROVIDER_SITE_OTHER): Payer: 59

## 2018-07-05 DIAGNOSIS — J309 Allergic rhinitis, unspecified: Secondary | ICD-10-CM | POA: Diagnosis not present

## 2018-07-12 ENCOUNTER — Ambulatory Visit (INDEPENDENT_AMBULATORY_CARE_PROVIDER_SITE_OTHER): Payer: 59

## 2018-07-12 DIAGNOSIS — J309 Allergic rhinitis, unspecified: Secondary | ICD-10-CM

## 2018-07-13 DIAGNOSIS — Q796 Ehlers-Danlos syndrome, unspecified: Secondary | ICD-10-CM | POA: Insufficient documentation

## 2018-07-14 DIAGNOSIS — N760 Acute vaginitis: Secondary | ICD-10-CM | POA: Diagnosis not present

## 2018-07-14 DIAGNOSIS — N390 Urinary tract infection, site not specified: Secondary | ICD-10-CM | POA: Diagnosis not present

## 2018-07-19 ENCOUNTER — Ambulatory Visit (INDEPENDENT_AMBULATORY_CARE_PROVIDER_SITE_OTHER): Payer: 59

## 2018-07-19 DIAGNOSIS — J309 Allergic rhinitis, unspecified: Secondary | ICD-10-CM

## 2018-07-26 ENCOUNTER — Ambulatory Visit (INDEPENDENT_AMBULATORY_CARE_PROVIDER_SITE_OTHER): Payer: 59

## 2018-07-26 DIAGNOSIS — J309 Allergic rhinitis, unspecified: Secondary | ICD-10-CM

## 2018-07-28 NOTE — Progress Notes (Addendum)
VIALS NOT NEEDED. 

## 2018-07-31 DIAGNOSIS — J4550 Severe persistent asthma, uncomplicated: Secondary | ICD-10-CM | POA: Diagnosis not present

## 2018-08-02 ENCOUNTER — Ambulatory Visit (INDEPENDENT_AMBULATORY_CARE_PROVIDER_SITE_OTHER): Payer: 59

## 2018-08-02 DIAGNOSIS — J309 Allergic rhinitis, unspecified: Secondary | ICD-10-CM

## 2018-08-09 ENCOUNTER — Ambulatory Visit (INDEPENDENT_AMBULATORY_CARE_PROVIDER_SITE_OTHER): Payer: 59

## 2018-08-09 DIAGNOSIS — J309 Allergic rhinitis, unspecified: Secondary | ICD-10-CM | POA: Diagnosis not present

## 2018-08-16 ENCOUNTER — Ambulatory Visit (INDEPENDENT_AMBULATORY_CARE_PROVIDER_SITE_OTHER): Payer: 59

## 2018-08-16 DIAGNOSIS — J309 Allergic rhinitis, unspecified: Secondary | ICD-10-CM

## 2018-08-18 DIAGNOSIS — N643 Galactorrhea not associated with childbirth: Secondary | ICD-10-CM | POA: Insufficient documentation

## 2018-08-18 DIAGNOSIS — F3281 Premenstrual dysphoric disorder: Secondary | ICD-10-CM | POA: Insufficient documentation

## 2018-08-18 DIAGNOSIS — Q523 Imperforate hymen: Secondary | ICD-10-CM | POA: Insufficient documentation

## 2018-08-23 ENCOUNTER — Ambulatory Visit (INDEPENDENT_AMBULATORY_CARE_PROVIDER_SITE_OTHER): Payer: 59

## 2018-08-23 DIAGNOSIS — J309 Allergic rhinitis, unspecified: Secondary | ICD-10-CM | POA: Diagnosis not present

## 2018-08-25 DIAGNOSIS — R1013 Epigastric pain: Secondary | ICD-10-CM | POA: Diagnosis not present

## 2018-08-25 DIAGNOSIS — R1310 Dysphagia, unspecified: Secondary | ICD-10-CM | POA: Diagnosis not present

## 2018-08-25 DIAGNOSIS — R1031 Right lower quadrant pain: Secondary | ICD-10-CM | POA: Diagnosis not present

## 2018-08-25 DIAGNOSIS — R1110 Vomiting, unspecified: Secondary | ICD-10-CM | POA: Diagnosis not present

## 2018-08-25 DIAGNOSIS — G8929 Other chronic pain: Secondary | ICD-10-CM | POA: Diagnosis not present

## 2018-08-25 DIAGNOSIS — K529 Noninfective gastroenteritis and colitis, unspecified: Secondary | ICD-10-CM | POA: Diagnosis not present

## 2018-08-25 DIAGNOSIS — R933 Abnormal findings on diagnostic imaging of other parts of digestive tract: Secondary | ICD-10-CM | POA: Diagnosis not present

## 2018-08-30 ENCOUNTER — Ambulatory Visit (INDEPENDENT_AMBULATORY_CARE_PROVIDER_SITE_OTHER): Payer: 59

## 2018-08-30 DIAGNOSIS — J309 Allergic rhinitis, unspecified: Secondary | ICD-10-CM

## 2018-08-31 DIAGNOSIS — J4550 Severe persistent asthma, uncomplicated: Secondary | ICD-10-CM | POA: Diagnosis not present

## 2018-09-06 ENCOUNTER — Ambulatory Visit (INDEPENDENT_AMBULATORY_CARE_PROVIDER_SITE_OTHER): Payer: 59

## 2018-09-06 DIAGNOSIS — J309 Allergic rhinitis, unspecified: Secondary | ICD-10-CM

## 2018-09-13 ENCOUNTER — Ambulatory Visit (INDEPENDENT_AMBULATORY_CARE_PROVIDER_SITE_OTHER): Payer: 59

## 2018-09-13 DIAGNOSIS — J309 Allergic rhinitis, unspecified: Secondary | ICD-10-CM

## 2018-09-14 ENCOUNTER — Telehealth: Payer: Self-pay

## 2018-09-14 NOTE — Telephone Encounter (Signed)
Pt called wanting to know what she can take for a reaction from eating M&M's. I told pt to get benadryl and make sure her Epi-pen is close.

## 2018-09-20 ENCOUNTER — Ambulatory Visit (INDEPENDENT_AMBULATORY_CARE_PROVIDER_SITE_OTHER): Payer: 59 | Admitting: Allergy and Immunology

## 2018-09-20 ENCOUNTER — Telehealth: Payer: Self-pay

## 2018-09-20 ENCOUNTER — Encounter: Payer: Self-pay | Admitting: Allergy and Immunology

## 2018-09-20 VITALS — BP 114/60 | HR 110 | Resp 18

## 2018-09-20 DIAGNOSIS — J3089 Other allergic rhinitis: Secondary | ICD-10-CM | POA: Diagnosis not present

## 2018-09-20 DIAGNOSIS — J4551 Severe persistent asthma with (acute) exacerbation: Secondary | ICD-10-CM | POA: Diagnosis not present

## 2018-09-20 DIAGNOSIS — T7800XD Anaphylactic reaction due to unspecified food, subsequent encounter: Secondary | ICD-10-CM

## 2018-09-20 DIAGNOSIS — J019 Acute sinusitis, unspecified: Secondary | ICD-10-CM | POA: Insufficient documentation

## 2018-09-20 DIAGNOSIS — J0100 Acute maxillary sinusitis, unspecified: Secondary | ICD-10-CM | POA: Diagnosis not present

## 2018-09-20 MED ORDER — FLUTICASONE PROPIONATE 93 MCG/ACT NA EXHU: 2.0000 | INHALANT_SUSPENSION | Freq: Two times a day (BID) | NASAL | 3 refills | Status: DC

## 2018-09-20 MED ORDER — CICLESONIDE 80 MCG/ACT IN AERS: 2.0000 | INHALATION_SPRAY | Freq: Two times a day (BID) | RESPIRATORY_TRACT | 0 refills | Status: DC

## 2018-09-20 MED ORDER — PREDNISOLONE 15 MG/5ML PO SOLN: ORAL | 0 refills | Status: DC

## 2018-09-20 NOTE — Assessment & Plan Note (Signed)
The patient's history suggests a recent cross-contamination of tree nut protein.  Continue meticulous avoidance of tree nuts and coconut and have access to epinephrine autoinjector 2 pack in case of accidental ingestion.  Food allergy action plan is in place.

## 2018-09-20 NOTE — Telephone Encounter (Signed)
Arnuity 100 mg, 1 inhalation daily.

## 2018-09-20 NOTE — Assessment & Plan Note (Addendum)
A prescription has been provided for prednisolone 15 mg/5 mL; 5 mL three times a day 3 days, then 5 mL twice a day x 2 days, then 5 mL x 1 day, then stop.  Continue Symbicort 160-4.5 g, 2 inhalations via spacer device twice daily, montelukast 10 mg daily bedtime, and albuterol HFA, 1 to 2 inhalations every 4-6 hours if needed.  For now, and during respiratory tract infections or asthma flares, add Alvesco 80 mcg, 2 inhalations via spacer device 2 times per day until symptoms have returned to baseline.  The patient has been asked to contact me if her symptoms persist or progress. Otherwise, she may return for follow up in 4 months.

## 2018-09-20 NOTE — Progress Notes (Signed)
Follow-up Note  RE: Christine [name] MRN: [id number] DOB: 15-Dec-2000 Date of Office Visit: 09/20/2018  Primary care provider: Hall Busing, MD Referring provider: Hall Busing, MD  History of present illness: Christine [name] is a 18 y.o. female with persistent asthma and allergic rhinitis on immunotherapy injections presenting today for a sick visit.  She was last seen in this clinic in October 2019.  She reports that over the past 4 days she has experienced frequent asthma and sinus symptoms.  She has been experiencing nasal congestion, sinus pressure, thick postnasal drainage, coughing, dyspnea, wheezing, and chest tightness.  She denies fevers, chills, and discolored mucus production.  She believes that her upper and lower respiratory symptoms were triggered by perfume/strong aroma exposure several days ago.  She is currently taking Symbicort 160-4.5 g, 2 inhalations via spacer device twice daily, montelukast 10 mg daily at bedtime, fluticasone nasal spray, and carbinoxamine as needed. She reports last week she consumed M&Ms and Cheerios and developed hives on her face as well as mild tongue swelling.  She took diphenhydramine and her symptoms resolved without further intervention.  She does her best to avoid tree nuts and coconut and has access to epinephrine autoinjectors.  Assessment and plan: Severe persistent asthma A prescription has been provided for prednisolone 15 mg/5 mL; 5 mL three times a day 3 days, then 5 mL twice a day x 2 days, then 5 mL x 1 day, then stop.  Continue Symbicort 160-4.5 g, 2 inhalations via spacer device twice daily, montelukast 10 mg daily bedtime, and albuterol HFA, 1 to 2 inhalations every 4-6 hours if needed.  For now, and during respiratory tract infections or asthma flares, add Alvesco 80 mcg, 2 inhalations via spacer device 2 times per day until symptoms have returned to baseline.  The patient has been asked to contact me if her  symptoms persist or progress. Otherwise, she may return for follow up in 4 months.  Acute sinusitis  Prednisone has been provided (as above).  A prescription has been provided for [facility], 2 actuations per nostril twice daily.  Nasal saline lavage (NeilMed) has been recommended as needed and prior to medicated nasal sprays along with instructions for proper administration.  For thick post nasal drainage, add guaifenesin 1200 mg (Mucinex)  twice daily as needed with adequate hydration as discussed.  The patient has been asked to contact our office if symptoms persist, progress, or if she becomes febrile.  Perennial and seasonal allergic rhinitis  Continue appropriate aeroallergen avoidance measures, montelukast 10 mg daily, and carbinoxamine 4 mg as needed.  Truett Perna has been prescribed (as above).  Resume aero allergen immunotherapy injections after the asthma exacerbation/sinusitis has resolved.  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  Continue meticulous avoidance of tree nuts and coconut and have access to epinephrine autoinjector 2 pack in case of accidental ingestion.  Food allergy action plan is in place.   Meds ordered this encounter  Medications  . ciclesonide (ALVESCO) 80 MCG/ACT inhaler    Sig: Inhale 2 puffs into the lungs 2 (two) times daily.    Dispense:  1 Inhaler    Refill:  0  . Fluticasone Propionate (XHANCE) 93 MCG/ACT EXHU    Sig: Place 2 sprays into the nose 2 (two) times daily.    Dispense:  16 mL    Refill:  3  . prednisoLONE (PRELONE) 15 MG/5ML SOLN    Sig: 5 mL three  times a day for three days, then 5 mL two times a day for 2 days, then 5 mL for one day.    Dispense:  450 mL    Refill:  0    Diagnostics: Spirometry: FVC was 3.05 L and FEV1 was 2.22 L (65% predicted) with significant (370 mL, 17%) postbronchodilator improvement.  Please see scanned spirometry results for details.    Physical  examination: Blood pressure 114/60, pulse (!) 110, resp. rate 18, SpO2 99 %.  General: Alert, interactive, in no acute distress. HEENT: TMs pearly gray, turbinates edematous with thick discharge, post-pharynx erythematous. Neck: Supple without lymphadenopathy. Lungs: Mildly decreased breath sounds bilaterally without wheezing, rhonchi or rales. CV: Normal S1, S2 without murmurs. Skin: Warm and dry, without lesions or rashes.  The following portions of the patient's history were reviewed and updated as appropriate: allergies, current medications, past family history, past medical history, past social history, past surgical history and problem list.  Allergies as of 09/20/2018      Reactions   Lactose Nausea And Vomiting   Lactose intolerance   Lactose Intolerance (gi)    UNSPECIFIED SEVERITY/REACTION   Other Other (See Comments)   PET DANDER SEASONAL ALLERGIES ASTHMA "TREE NUTS" >> RASH   Pollen Extract    UNSPECIFIED REACTION    Coconut Oil Rash      Medication List       Accurate as of September 20, 2018  1:06 PM. Always use your most recent med list.        albuterol 108 (90 Base) MCG/ACT inhaler Commonly known as:  PROVENTIL HFA;VENTOLIN HFA Inhale 2 puffs into the lungs every 4 (four) hours as needed for wheezing or shortness of breath.   ALPRAZolam 0.25 MG tablet Commonly known as:  XANAX Take 0.125 mg by mouth 3 (three) times daily as needed (for anxiety/panic attacks.).   budesonide-formoterol 160-4.5 MCG/ACT inhaler Commonly known as:  SYMBICORT Inhale 2 puffs into the lungs 2 (two) times daily.   calcium carbonate 500 MG chewable tablet Commonly known as:  TUMS - dosed in mg elemental calcium Chew 1-2 tablets by mouth 3 (three) times daily as needed for indigestion or heartburn.   Carbinoxamine Maleate 4 MG Tabs Take 1 tablet (4 mg total) by mouth every 6 (six) hours.   ciclesonide 80 MCG/ACT inhaler Commonly known as:  ALVESCO Inhale 2 puffs into the  lungs 2 (two) times daily.   [facility] BISMUTH 262 MG chewable tablet Generic drug:  bismuth subsalicylate Chew by mouth.   drospirenone-ethinyl estradiol 3-0.02 MG tablet Commonly known as:  YAZ,GIANVI,LORYNA Take 1 tablet by mouth daily.   Fluticasone Propionate 93 MCG/ACT Exhu Commonly known as:  XHANCE Place 2 sprays into the nose 2 (two) times daily.   hyoscyamine 0.125 MG tablet Commonly known as:  LEVSIN, ANASPAZ Take by mouth.   ibuprofen 200 MG tablet Commonly known as:  ADVIL,MOTRIN Take 600 mg by mouth every 8 (eight) hours as needed (for pain.).   levothyroxine 50 MCG tablet Commonly known as:  SYNTHROID, LEVOTHROID Take 50 mcg by mouth daily before breakfast.   montelukast 10 MG tablet Commonly known as:  SINGULAIR Take 1 tablet (10 mg total) by mouth at bedtime.   ondansetron 4 MG tablet Commonly known as:  ZOFRAN Take by mouth.   prednisoLONE 15 MG/5ML Soln Commonly known as:  PRELONE 5 mL three times a day for three days, then 5 mL two times a day for 2 days, then 5 mL for one  day.   QUILLIVANT XR 25 MG/5ML Susr Generic drug:  Methylphenidate HCl ER Take 25 mg by mouth daily as needed (for concentration).   UNABLE TO FIND CBD oil for pain and CBD with melatonin for sleep       Allergies  Allergen Reactions  . Lactose Nausea And Vomiting    Lactose intolerance  . Lactose Intolerance (Gi)     UNSPECIFIED SEVERITY/REACTION  . Other Other (See Comments)    PET DANDER SEASONAL ALLERGIES ASTHMA "TREE NUTS" >> RASH  . Pollen Extract     UNSPECIFIED REACTION   . Coconut Oil Rash   Review of systems: Review of systems negative except as noted in HPI / PMHx or noted below: Constitutional: Negative.  HENT: Negative.   Eyes: Negative.  Respiratory: Negative.   Cardiovascular: Negative.  Gastrointestinal: Negative.  Genitourinary: Negative.  Musculoskeletal: Negative.  Neurological: Negative.  Endo/Heme/Allergies: Negative.  Cutaneous:  Negative.  Past Medical History:  Diagnosis Date  . ADHD   . Allergy   . Anemia    reports history of slight anemia  . Anxiety   . Asthma   . 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 Depression   . Ehlers-Danlos syndrome    diagnosed x8month ago  . GERD (gastroesophageal reflux disease)   . H. pylori infection   . Heart murmur   . Hyperprolactinemia ([place])   . Hypothyroidism   . POTS (postural orthostatic tachycardia syndrome)    mother reports that this was mainly when she was younger  . Urticaria   . Vision abnormalities     Family History  Adopted: Yes  Problem Relation Age of Onset  . Bipolar disorder Maternal Uncle   . Allergic rhinitis Neg Hx   . Asthma Neg Hx   . Eczema Neg Hx   . Urticaria Neg Hx     Social History   Socioeconomic History  . Marital status: Single    Spouse name: Not on file  . Number of children: Not on file  . Years of education: Not on file  . Highest education level: Not on file  Occupational History  . Not on file  Social Needs  . Financial resource strain: Not on file  . Food insecurity:    Worry: Not on file    Inability: Not on file  . Transportation needs:    Medical: Not on file    Non-medical: Not on file  Tobacco Use  . Smoking status: Never Smoker  . Smokeless tobacco: Never Used  Substance and Sexual Activity  . Alcohol use: No    Alcohol/week: 0.0 standard drinks  . Drug use: No  . Sexual activity: Never    Birth control/protection: Abstinence    Comment: insurance questions declined  Lifestyle  . Physical activity:    Days per week: Not on file    Minutes per session: Not on file  . Stress: Not on file  Relationships  . Social connections:    Talks on phone: Not on file    Gets together: Not on file    Attends religious service: Not on file    Active member of club or organization: Not on file    Attends meetings of clubs or organizations: Not on file     Relationship status: Not on file  . Intimate partner violence:    Fear of current or ex partner: Not on file    Emotionally abused: Not  on file    Physically abused: Not on file    Forced sexual activity: Not on file  Other Topics Concern  . Not on file  Social History Narrative  . Not on file    I appreciate the opportunity to take part in Christine [name]'s care. Please do not hesitate to contact me with questions.  Sincerely,   R. Edgar Frisk, MD

## 2018-09-20 NOTE — Telephone Encounter (Signed)
Mom called stating that the patient's Alvesco 80 mcg was over $300 and was wanting to know if there was another inhaler that may be cheaper. She stated she has tried the [employer] and has had a bad side effect from it and didn't want to go that route. Please advise.

## 2018-09-20 NOTE — Patient Instructions (Addendum)
Severe persistent asthma A prescription has been provided for prednisolone 15 mg/5 mL; 5 mL three times a day 3 days, then 5 mL twice a day x 2 days, then 5 mL x 1 day, then stop.  Continue Symbicort 160-4.5 g, 2 inhalations via spacer device twice daily, montelukast 10 mg daily bedtime, and albuterol HFA, 1 to 2 inhalations every 4-6 hours if needed.  For now, and during respiratory tract infections or asthma flares, add Alvesco 80 mcg, 2 inhalations via spacer device 2 times per day until symptoms have returned to baseline.  The patient has been asked to contact me if her symptoms persist or progress. Otherwise, she may return for follow up in 4 months.  Acute sinusitis  Prednisone has been provided (as above).  A prescription has been provided for [facility], 2 actuations per nostril twice daily.  Nasal saline lavage (NeilMed) has been recommended as needed and prior to medicated nasal sprays along with instructions for proper administration.  For thick post nasal drainage, add guaifenesin 1200 mg (Mucinex)  twice daily as needed with adequate hydration as discussed.  The patient has been asked to contact our office if symptoms persist, progress, or if she becomes febrile.  Perennial and seasonal allergic rhinitis  Continue appropriate aeroallergen avoidance measures, montelukast 10 mg daily, and carbinoxamine 4 mg as needed.  Truett Perna has been prescribed (as above).  Resume aero allergen immunotherapy injections after the asthma exacerbation/sinusitis has resolved.  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  Continue meticulous avoidance of tree nuts and coconut and have access to epinephrine autoinjector 2 pack in case of accidental ingestion.  Food allergy action plan is in place.   Return in about 4 months (around 01/19/2019), or if symptoms worsen or fail to improve.

## 2018-09-20 NOTE — Assessment & Plan Note (Signed)
   Continue appropriate aeroallergen avoidance measures, montelukast 10 mg daily, and carbinoxamine 4 mg as needed.  Christine [name] has been prescribed (as above).  Resume aero allergen immunotherapy injections after the asthma exacerbation/sinusitis has resolved.

## 2018-09-20 NOTE — Assessment & Plan Note (Signed)
   Prednisone has been provided (as above).  A prescription has been provided for [facility], 2 actuations per nostril twice daily.  Nasal saline lavage (NeilMed) has been recommended as needed and prior to medicated nasal sprays along with instructions for proper administration.  For thick post nasal drainage, add guaifenesin 1200 mg (Mucinex)  twice daily as needed with adequate hydration as discussed.  The patient has been asked to contact our office if symptoms persist, progress, or if she becomes febrile.

## 2018-09-21 NOTE — Telephone Encounter (Signed)
Called and left a message for patient or mom to call office in regards to this matter.The Arnuity inhaler has a contraindications due to patient having a lactose intolerance. Per Dr. Verlin Fester if insurance would pay for Pulmicort neb solution we could send that in but the only other thing would be the Flovent. See what side effects the patient had on Flovent.

## 2018-09-23 DIAGNOSIS — D2272 Melanocytic nevi of left lower limb, including hip: Secondary | ICD-10-CM | POA: Diagnosis not present

## 2018-09-23 DIAGNOSIS — D2261 Melanocytic nevi of right upper limb, including shoulder: Secondary | ICD-10-CM | POA: Diagnosis not present

## 2018-09-23 DIAGNOSIS — D2262 Melanocytic nevi of left upper limb, including shoulder: Secondary | ICD-10-CM | POA: Diagnosis not present

## 2018-09-26 NOTE — Telephone Encounter (Signed)
Called and left a message for patient to call office in regards to this matter. If I don't hear from her today I will discuss with her and mom tomorrow in [place].

## 2018-09-28 NOTE — Telephone Encounter (Signed)
Called and left a message for patient to call office in regards to this matter. 

## 2018-09-29 DIAGNOSIS — J4550 Severe persistent asthma, uncomplicated: Secondary | ICD-10-CM | POA: Diagnosis not present

## 2018-10-01 IMAGING — CR DG CHEST 2V
2 series · 2 of 2 positions shown · non-contrast
Comparison: 05/17/2016

CLINICAL DATA: Shortness of breath.  Nausea.

EXAM:
CHEST  2 VIEW

[w chest pa]
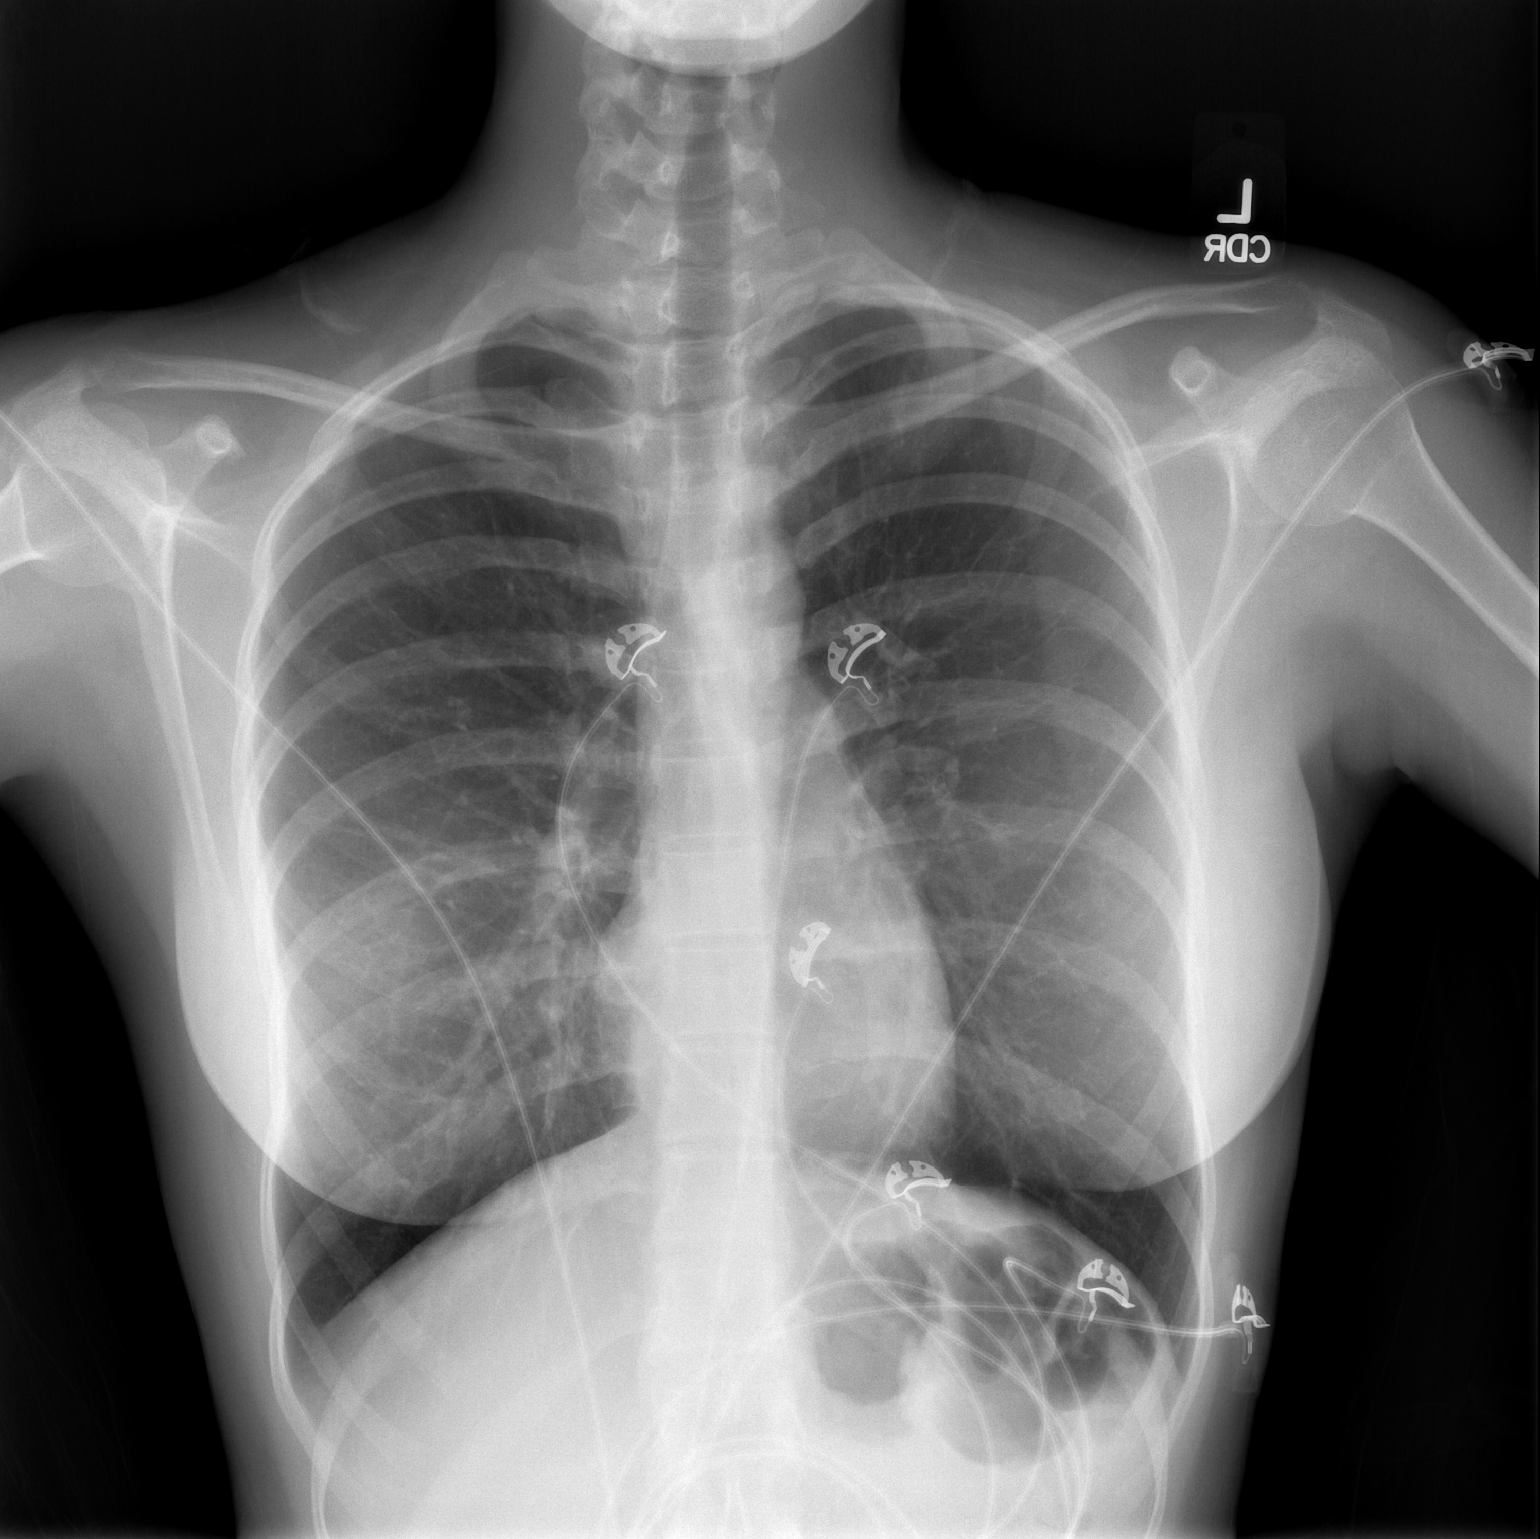

[w chest lat]
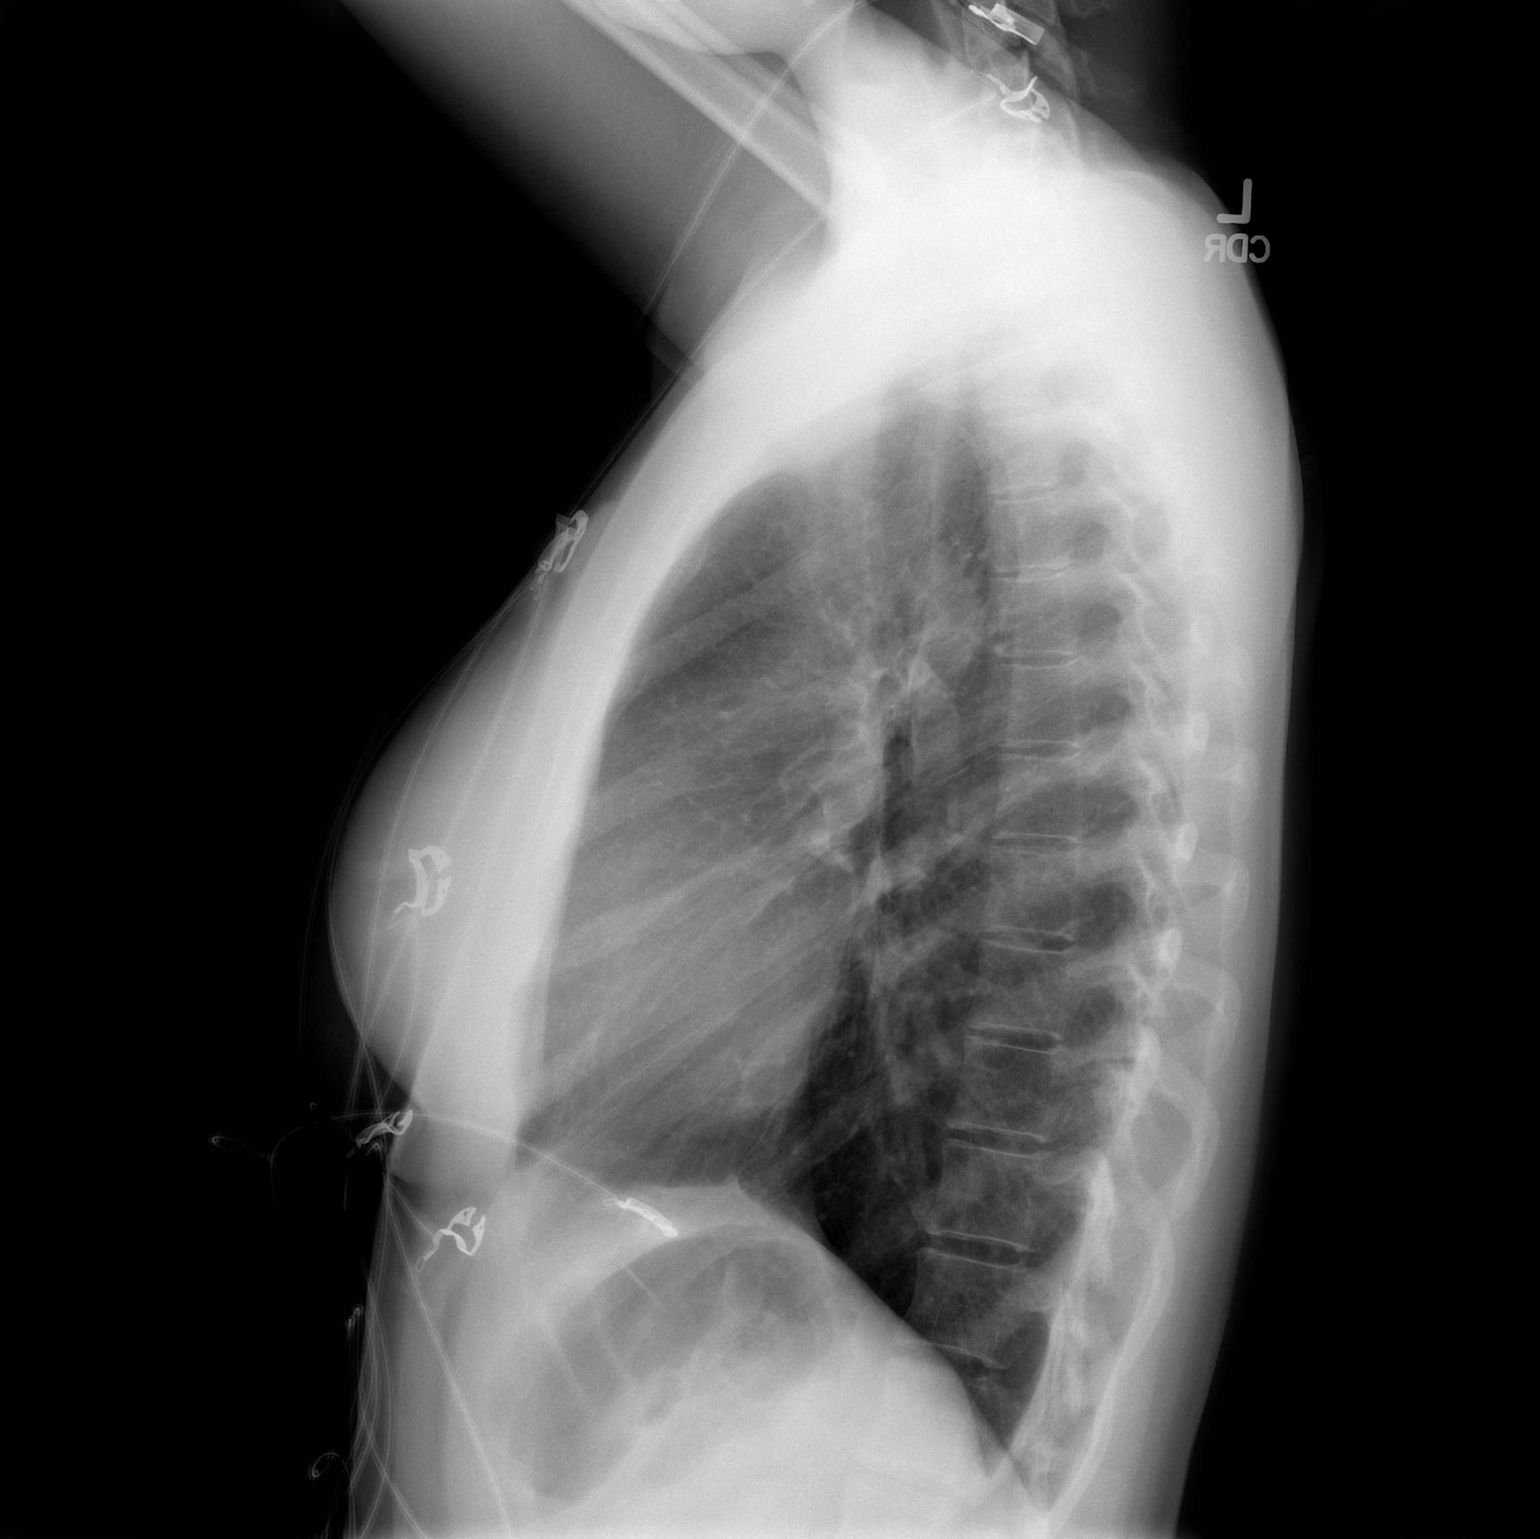

[2 of 2 positions shown; findings below may reference images not displayed]

FINDINGS: The cardiomediastinal contours are normal. The lungs are clear.
Pulmonary vasculature is normal. No consolidation, pleural effusion,
or pneumothorax. No acute osseous abnormalities are seen.
IMPRESSION: No acute abnormality.

## 2018-10-04 ENCOUNTER — Ambulatory Visit (INDEPENDENT_AMBULATORY_CARE_PROVIDER_SITE_OTHER): Payer: 59

## 2018-10-04 ENCOUNTER — Other Ambulatory Visit: Payer: Self-pay | Admitting: Allergy and Immunology

## 2018-10-04 DIAGNOSIS — J309 Allergic rhinitis, unspecified: Secondary | ICD-10-CM

## 2018-10-04 NOTE — Telephone Encounter (Signed)
Spoke with patient and mom and informed them of this matter. Mom stated that she has her nebulizer if she starts having problems they will just use that. She also stated that if her problems seem to get too bad that she will pick up the [place].

## 2018-10-05 DIAGNOSIS — J45909 Unspecified asthma, uncomplicated: Secondary | ICD-10-CM | POA: Diagnosis not present

## 2018-10-05 DIAGNOSIS — J309 Allergic rhinitis, unspecified: Secondary | ICD-10-CM | POA: Diagnosis not present

## 2018-10-05 DIAGNOSIS — H6121 Impacted cerumen, right ear: Secondary | ICD-10-CM | POA: Diagnosis not present

## 2018-10-12 ENCOUNTER — Other Ambulatory Visit: Payer: Self-pay | Admitting: Allergy and Immunology

## 2018-10-21 ENCOUNTER — Other Ambulatory Visit: Payer: Self-pay

## 2018-10-21 MED ORDER — MONTELUKAST SODIUM 10 MG PO TABS: 10.0000 mg | ORAL_TABLET | Freq: Every day | ORAL | 1 refills | Status: DC

## 2018-10-30 DIAGNOSIS — J4550 Severe persistent asthma, uncomplicated: Secondary | ICD-10-CM | POA: Diagnosis not present

## 2018-11-29 DIAGNOSIS — J4550 Severe persistent asthma, uncomplicated: Secondary | ICD-10-CM | POA: Diagnosis not present

## 2019-01-24 ENCOUNTER — Ambulatory Visit: Payer: 59 | Admitting: Allergy and Immunology

## 2019-02-20 ENCOUNTER — Other Ambulatory Visit: Payer: Self-pay | Admitting: Allergy and Immunology

## 2019-04-21 DIAGNOSIS — E039 Hypothyroidism, unspecified: Secondary | ICD-10-CM | POA: Insufficient documentation

## 2019-05-18 ENCOUNTER — Other Ambulatory Visit: Payer: Self-pay | Admitting: Allergy and Immunology

## 2019-06-06 ENCOUNTER — Other Ambulatory Visit: Payer: Self-pay

## 2019-06-12 ENCOUNTER — Other Ambulatory Visit: Payer: Self-pay | Admitting: Allergy and Immunology

## 2019-07-28 ENCOUNTER — Other Ambulatory Visit: Payer: Self-pay | Admitting: Allergy and Immunology

## 2019-07-31 ENCOUNTER — Other Ambulatory Visit: Payer: Self-pay | Admitting: Allergy and Immunology

## 2019-09-05 ENCOUNTER — Telehealth: Payer: Self-pay

## 2019-09-05 ENCOUNTER — Other Ambulatory Visit: Payer: Self-pay | Admitting: Allergy and Immunology

## 2019-09-05 NOTE — Telephone Encounter (Signed)
Unable to leave message on patients cell phone her vmbox not set up. I wanted to let her know we sent in a courtesy refill on her montelukast 10 mg. Pt needs to schedule an appointment and to be seen by her physician before we can send in any more additional refills.

## 2019-09-11 ENCOUNTER — Other Ambulatory Visit: Payer: Self-pay | Admitting: Allergy and Immunology

## 2019-09-12 ENCOUNTER — Telehealth: Payer: Self-pay

## 2019-09-12 NOTE — Telephone Encounter (Signed)
Patients mom Mickel Baas) called wanting a refill for Montelukast 90 day supply, I told her she needed a follow up televisit   because her last visit was 09/13/2018. I asked her to schedule an appointment but she stated she didn't have time to schedule one and that she will call back tomorrow to do so, and I let her know when she schedules an appointment we will go ahead and put in a courtesy refill in.

## 2019-09-13 MED ORDER — MONTELUKAST SODIUM 10 MG PO TABS: 10.0000 mg | ORAL_TABLET | Freq: Every day | ORAL | 0 refills | Status: DC

## 2019-09-13 NOTE — Telephone Encounter (Signed)
Call to patient mother, mom is requesting a refill of the montelukast to be sent in .  Refill sent to the pharmacy.

## 2019-09-13 NOTE — Telephone Encounter (Signed)
Patients mother called in to schedule her daughters yearly followup for April 5th.  Her daughter Christine [name] is in need of refills prior to this visit and would like a courtesy refill to carry her over to her office visit date.  Please advise.

## 2019-10-30 ENCOUNTER — Other Ambulatory Visit: Payer: Self-pay

## 2019-10-30 ENCOUNTER — Encounter: Payer: Self-pay | Admitting: Allergy and Immunology

## 2019-10-30 ENCOUNTER — Ambulatory Visit (INDEPENDENT_AMBULATORY_CARE_PROVIDER_SITE_OTHER): Payer: 59 | Admitting: Allergy and Immunology

## 2019-10-30 DIAGNOSIS — J3089 Other allergic rhinitis: Secondary | ICD-10-CM

## 2019-10-30 DIAGNOSIS — T7800XA Anaphylactic reaction due to unspecified food, initial encounter: Secondary | ICD-10-CM

## 2019-10-30 DIAGNOSIS — J4551 Severe persistent asthma with (acute) exacerbation: Secondary | ICD-10-CM

## 2019-10-30 DIAGNOSIS — H1013 Acute atopic conjunctivitis, bilateral: Secondary | ICD-10-CM

## 2019-10-30 MED ORDER — PREDNISOLONE 15 MG/5ML PO SOLN: ORAL | 0 refills | Status: DC

## 2019-10-30 MED ORDER — MONTELUKAST SODIUM 10 MG PO TABS: 10.0000 mg | ORAL_TABLET | Freq: Every day | ORAL | 1 refills | Status: AC

## 2019-10-30 MED ORDER — ALBUTEROL SULFATE (2.5 MG/3ML) 0.083% IN NEBU: 2.5000 mg | INHALATION_SOLUTION | RESPIRATORY_TRACT | 1 refills | Status: DC | PRN

## 2019-10-30 MED ORDER — ALBUTEROL SULFATE HFA 108 (90 BASE) MCG/ACT IN AERS: 2.0000 | INHALATION_SPRAY | RESPIRATORY_TRACT | 1 refills | Status: DC | PRN

## 2019-10-30 MED ORDER — BREZTRI AEROSPHERE 160-9-4.8 MCG/ACT IN AERO: 2.0000 | INHALATION_SPRAY | Freq: Two times a day (BID) | RESPIRATORY_TRACT | 5 refills | Status: DC

## 2019-10-30 MED ORDER — CARBINOXAMINE MALEATE 4 MG PO TABS: 1.0000 | ORAL_TABLET | Freq: Four times a day (QID) | ORAL | 5 refills | Status: DC

## 2019-10-30 NOTE — Assessment & Plan Note (Signed)
Poorly controlled.  A prescription has been provided for prednisolone 15 mg/5 mL; 5 mL 3 times per day 3 days, then 5 mL 2 times per day on day 4, then 5 mL on day 5, then stop.  A prescription has been provided for BrezTri 160 g, 2 inhalations via spacer device twice daily.  Continue montelukast 10 mg daily at bedtime and albuterol every 4-6 hours if needed.  A refill prescription has been provided for albuterol 0.083% via nebulizer every 4-6 hours if needed.  The patient has been asked to contact me if her symptoms persist or progress. Otherwise, she may return for follow up in 6 weeks.

## 2019-10-30 NOTE — Assessment & Plan Note (Signed)
   Continue appropriate aeroallergen avoidance measures, montelukast 10 mg daily, Xhance nasal spray as needed, and carbinoxamine 4 mg as needed.  Consider restarting aeroallergen immunotherapy injections.

## 2019-10-30 NOTE — Progress Notes (Deleted)
Follow-up Note  RE: Christine [name] MRN: [id number] DOB: 2001-05-16 Date of Office Visit: 10/30/2019  Primary care provider: Hall Busing, MD Referring provider: Hall Busing, MD  History of present illness: Christine [name] is a 19 y.o. female ***   ashtma had been well contreolled while on ait and then 4-6 months startded heading in the wrong direction. Asthma "pretty bad with out the allergy shot"  Asthma sx every day, alb every couple/few days  Worse with exertion and pollen exposoure  Noct sx 4 times per week, and lda  Out of alb neb,   symbi 2/2/ with spacer and monte   flovent caused diarhea -   --------------  breztri 160 2/2 spacer  Prednisone pack   Assessment and plan: Severe persistent asthma Poorly controlled.  A prescription has been provided for prednisolone 15 mg/5 mL; 5 mL 3 times per day 3 days, then 5 mL 2 times per day on day 4, then 5 mL on day 5, then stop.  A prescription has been provided for BrezTri 160 g, 2 inhalations via spacer device twice daily.  Continue montelukast 10 mg daily at bedtime and albuterol every 4-6 hours if needed.  A refill prescription has been provided for albuterol 0.083% via nebulizer every 4-6 hours if needed.  The patient has been asked to contact me if her symptoms persist or progress. Otherwise, she may return for follow up in 6 weeks.  Perennial and seasonal allergic rhinitis  Continue appropriate aeroallergen avoidance measures, montelukast 10 mg daily, Xhance nasal spray as needed, and carbinoxamine 4 mg as needed.  Consider restarting aeroallergen immunotherapy injections.  Food allergy  Continue meticulous avoidance of tree nuts and coconut and have access to epinephrine autoinjector 2 pack in case of accidental ingestion.  Food allergy action plan is in place.   Meds ordered this encounter  Medications  . Carbinoxamine Maleate 4 MG TABS    Sig: Take 1 tablet (4 mg  total) by mouth every 6 (six) hours. Use as needed.    Dispense:  180 tablet    Refill:  5  . montelukast (SINGULAIR) 10 MG tablet    Sig: Take 1 tablet (10 mg total) by mouth at bedtime.    Dispense:  90 tablet    Refill:  1  . albuterol (VENTOLIN HFA) 108 (90 Base) MCG/ACT inhaler    Sig: Inhale 2 puffs into the lungs every 4 (four) hours as needed for wheezing or shortness of breath.    Dispense:  18 g    Refill:  1  . Budeson-Glycopyrrol-Formoterol (BREZTRI AEROSPHERE) 160-9-4.8 MCG/ACT AERO    Sig: Inhale 2 puffs into the lungs in the morning and at bedtime.    Dispense:  10.7 g    Refill:  5  . prednisoLONE (PRELONE) 15 MG/5ML SOLN    Sig: A prescription has been provided for prednisolone 15 mg/5 mL; 5 mL 3 times per day 3 days, then 5 mL 2 times per day on day 4, then 5 mL on day 5, then stop.    Dispense:  75 mL    Refill:  0  . albuterol (PROVENTIL) (2.5 MG/3ML) 0.083% nebulizer solution    Sig: Take 3 mLs (2.5 mg total) by nebulization every 4 (four) hours as needed for wheezing or shortness of breath.    Dispense:  75 mL    Refill:  1    Diagnostics: ***    Physical examination: There were no vitals  taken for this visit.  General: Alert, interactive, in no acute distress. HEENT: TMs pearly gray, turbinates {Blank single:19197::"non-edematous","edematous","edematous and pale","markedly edematous","markedly edematous and pale","moderately edematous","mildly edematous","minimally edematous"} {Blank single:19197::"with crusty discharge","with thick discharge","with clear discharge","without discharge"}, post-pharynx {Blank single:19197::"non erythematous","erythematous","markedly erythematous","moderately erythematous","mildly erythematous","unremarkable"}. Neck: Supple without lymphadenopathy. Lungs: {Blank single:19197::"Upper airway sounds transmitted","Bronchial sounds auscultated bilaterally","Decreased breath sounds with expiratory wheezing bilaterally","Mildly  decreased breath sounds with expiratory wheezing bilaterally","Decreased breath sounds bilaterally without wheezing, rhonchi or rales","Mildly decreased breath sounds bilaterally without wheezing, rhonchi or rales","Clear to auscultation without wheezing, rhonchi or rales"}. CV: Normal S1, S2 without murmurs. Skin: {Blank single:19197::"Dry, erythematous, excoriated patches on the ***","Dry, hyperpigmented, thickened patches on the ***","Dry, mildly hyperpigmented, mildly thickened patches on the ***","Scattered erythematous urticarial type lesions primarily located *** , nonvesicular","Warm and dry, without lesions or rashes"}.  {Common ambulatory SmartLinks:19316::" "}  Current Outpatient Medications  Medication Sig Dispense Refill  . albuterol (VENTOLIN HFA) 108 (90 Base) MCG/ACT inhaler Inhale 2 puffs into the lungs every 4 (four) hours as needed for wheezing or shortness of breath. 18 g 1  . ALPRAZolam (XANAX) 0.25 MG tablet Take 0.125 mg by mouth 3 (three) times daily as needed (for anxiety/panic attacks.).     Marland Kitchen calcium carbonate (TUMS - DOSED IN MG ELEMENTAL CALCIUM) 500 MG chewable tablet Chew 1-2 tablets by mouth 3 (three) times daily as needed for indigestion or heartburn.    . Carbinoxamine Maleate 4 MG TABS Take 1 tablet (4 mg total) by mouth every 6 (six) hours. Use as needed. 180 tablet 5  . drospirenone-ethinyl estradiol (YAZ,GIANVI,LORYNA) 3-0.02 MG tablet Take 1 tablet by mouth daily.    . fluticasone (FLONASE) 50 MCG/ACT nasal spray USE 1 SPRAY IN EACH NOSTRIL DAILY 32 g 0  . hydrOXYzine (ATARAX/VISTARIL) 25 MG tablet Take by mouth.    . hyoscyamine (LEVSIN, ANASPAZ) 0.125 MG tablet Take by mouth.    . Hyoscyamine Sulfate SL 0.125 MG SUBL PLACE 1 TABLET UNDER TONGUE EVERY 6 HOURS AS NEEDED FOR CRAMPING    . ibuprofen (ADVIL,MOTRIN) 200 MG tablet Take 600 mg by mouth every 8 (eight) hours as needed (for pain.).    Marland Kitchen levothyroxine (SYNTHROID) 75 MCG tablet Take 75 mcg by mouth  daily.    . montelukast (SINGULAIR) 10 MG tablet Take 1 tablet (10 mg total) by mouth at bedtime. 90 tablet 1  . ondansetron (ZOFRAN) 4 MG tablet Take by mouth.    . pantoprazole (PROTONIX) 40 MG tablet TAKE 1 TABLET(40 MG) BY MOUTH TWICE DAILY BEFORE MEALS    . SYMBICORT 160-4.5 MCG/ACT inhaler USE 2 INHALATIONS BY MOUTH  TWICE DAILY 10.2 g 0  . UNABLE TO FIND CBD oil for pain and CBD with melatonin for sleep    . albuterol (PROVENTIL) (2.5 MG/3ML) 0.083% nebulizer solution Take 3 mLs (2.5 mg total) by nebulization every 4 (four) hours as needed for wheezing or shortness of breath. 75 mL 1  . bismuth subsalicylate ([employer] BISMUTH) 262 MG chewable tablet Chew by mouth.    . Budeson-Glycopyrrol-Formoterol (BREZTRI AEROSPHERE) 160-9-4.8 MCG/ACT AERO Inhale 2 puffs into the lungs in the morning and at bedtime. 10.7 g 5  . ciclesonide (ALVESCO) 80 MCG/ACT inhaler Inhale 2 puffs into the lungs 2 (two) times daily. (Patient not taking: Reported on 10/30/2019) 1 Inhaler 0  . Fluticasone Propionate (XHANCE) 93 MCG/ACT EXHU Place 2 sprays into the nose 2 (two) times daily. (Patient not taking: Reported on 10/30/2019) 16 mL 3  . levothyroxine (SYNTHROID, LEVOTHROID) 50 MCG tablet  Take 50 mcg by mouth daily before breakfast.    . Methylphenidate HCl ER (QUILLIVANT XR) 25 MG/5ML SUSR Take 25 mg by mouth daily as needed (for concentration).     . prednisoLONE (PRELONE) 15 MG/5ML SOLN 5 mL three times a day for three days, then 5 mL two times a day for 2 days, then 5 mL for one day. (Patient not taking: Reported on 10/30/2019) 450 mL 0  . prednisoLONE (PRELONE) 15 MG/5ML SOLN A prescription has been provided for prednisolone 15 mg/5 mL; 5 mL 3 times per day 3 days, then 5 mL 2 times per day on day 4, then 5 mL on day 5, then stop. 75 mL 0   No current facility-administered medications for this visit.    Allergies  Allergen Reactions  . Lactose Nausea And Vomiting    Lactose intolerance  . Lactose Intolerance (Gi)       UNSPECIFIED SEVERITY/REACTION  . Other Other (See Comments)    PET DANDER SEASONAL ALLERGIES ASTHMA "TREE NUTS" >> RASH  . Pollen Extract     UNSPECIFIED REACTION   . Coconut Oil Rash    I appreciate the opportunity to take part in Nohea's care. Please do not hesitate to contact me with questions.  Sincerely,   R. Edgar Frisk, MD

## 2019-10-30 NOTE — Progress Notes (Signed)
Follow-up Telemedicine Note  RE: Christine [name] MRN: [id number] DOB: 2000-09-01 Date of Telemedicine Visit: 10/30/2019  Primary care provider: Hall Busing, MD Referring provider: Hall Busing, MD  Telemedicine Follow Up Visit via Telephone: I connected with Harlon Ditty for a follow up on 10/30/19 by telephone and verified that I am speaking with the correct person using two identifiers.   The limitations, risks, security and privacy concerns of performing an evaluation and management service by telemedicine, the availability of in person appointments, and that there may be a patient responsible charge related to this service were discussed. The patient expressed understanding and agreed to proceed.  Patient is at home.  Provider is at the office.  Visit start time: 4:22 PM Visit end time: 6:28 PM Insurance consent/check in by: Mel Almond Medical consent and medical assistant/nurse: Lisabeth Pick  History of present illness: Christine [name] is a 19 y.o. female with persistent asthma, allergic rhinitis, and food allergies presenting today for sick visit.  She was last seen in this clinic in February 2020.  She reports that her asthma has been well controlled while on immunotherapy injections, which she discontinued in March 2020.  However, approximately 4 to 6 months after discontinuing the immunotherapy injections her asthma symptoms became more frequent and severe despite compliance with Symbicort 160, 2 inhalations via spacer device twice daily, and montelukast 10 mg daily.  Recently, she has been experiencing asthma symptoms every day, her normal daily activities have been limited by her asthma, and she is awakened from sleep due to lower respiratory symptoms 4 times per week on average.  Her asthma specifically triggered by physical exertion and pollen exposure, however she has still been experiencing symptoms in the absence of exertion and pollen exposure.  Her nasal allergy  symptoms have been controlled with carbinoxamine and montelukast.  She is interested in restarting immunotherapy injections, however is hesitant to do so due to concern of SARS-CoV-2 exposure.  Assessment and plan: Severe persistent asthma Poorly controlled.  A prescription has been provided for prednisolone 15 mg/5 mL; 5 mL 3 times per day 3 days, then 5 mL 2 times per day on day 4, then 5 mL on day 5, then stop.  A prescription has been provided for BrezTri 160 g, 2 inhalations via spacer device twice daily.  Continue montelukast 10 mg daily at bedtime and albuterol every 4-6 hours if needed.  A refill prescription has been provided for albuterol 0.083% via nebulizer every 4-6 hours if needed.  The patient has been asked to contact me if her symptoms persist or progress. Otherwise, she may return for follow up in 6 weeks.  Perennial and seasonal allergic rhinitis  Continue appropriate aeroallergen avoidance measures, montelukast 10 mg daily, Xhance nasal spray as needed, and carbinoxamine 4 mg as needed.  Consider restarting aeroallergen immunotherapy injections.  Food allergy  Continue meticulous avoidance of tree nuts and coconut and have access to epinephrine autoinjector 2 pack in case of accidental ingestion.  Food allergy action plan is in place.   Meds ordered this encounter  Medications  . Carbinoxamine Maleate 4 MG TABS    Sig: Take 1 tablet (4 mg total) by mouth every 6 (six) hours. Use as needed.    Dispense:  180 tablet    Refill:  5  . montelukast (SINGULAIR) 10 MG tablet    Sig: Take 1 tablet (10 mg total) by mouth at bedtime.    Dispense:  90 tablet  Refill:  1  . albuterol (VENTOLIN HFA) 108 (90 Base) MCG/ACT inhaler    Sig: Inhale 2 puffs into the lungs every 4 (four) hours as needed for wheezing or shortness of breath.    Dispense:  18 g    Refill:  1  . Budeson-Glycopyrrol-Formoterol (BREZTRI AEROSPHERE) 160-9-4.8 MCG/ACT AERO    Sig: Inhale 2  puffs into the lungs in the morning and at bedtime.    Dispense:  10.7 g    Refill:  5  . DISCONTD: prednisoLONE (PRELONE) 15 MG/5ML SOLN    Sig: A prescription has been provided for prednisolone 15 mg/5 mL; 5 mL 3 times per day 3 days, then 5 mL 2 times per day on day 4, then 5 mL on day 5, then stop.    Dispense:  75 mL    Refill:  0  . albuterol (PROVENTIL) (2.5 MG/3ML) 0.083% nebulizer solution    Sig: Take 3 mLs (2.5 mg total) by nebulization every 4 (four) hours as needed for wheezing or shortness of breath.    Dispense:  75 mL    Refill:  1  . DISCONTD: prednisoLONE (PRELONE) 15 MG/5ML SOLN    Sig: 5 mL 3 times per day 3 days, then 5 mL 2 times per day on day 4, then 5 mL on day 5, then stop.    Dispense:  75 mL    Refill:  0  . prednisoLONE (PRELONE) 15 MG/5ML SOLN    Sig: 5 mL 3 times per day 3 days, then 5 mL 2 times per day on day 4, then 5 mL on day 5, then stop.    Dispense:  75 mL    Refill:  0    Diagnostics: None.   Physical examination: Physical Exam Not obtained as encounter was done via telephone.   The following portions of the patient's history were reviewed and updated as appropriate: allergies, current medications, past family history, past medical history, past social history, past surgical history and problem list.  Allergies as of 10/30/2019      Reactions   Lactose Nausea And Vomiting   Lactose intolerance   Lactose Intolerance (gi)    UNSPECIFIED SEVERITY/REACTION   Other Other (See Comments)   PET DANDER SEASONAL ALLERGIES ASTHMA "TREE NUTS" >> RASH   Pollen Extract    UNSPECIFIED REACTION    Coconut Oil Rash      Medication List       Accurate as of October 30, 2019 11:05 PM. If you have any questions, ask your nurse or doctor.        albuterol 108 (90 Base) MCG/ACT inhaler Commonly known as: VENTOLIN HFA Inhale 2 puffs into the lungs every 4 (four) hours as needed for wheezing or shortness of breath. What changed: Another medication  with the same name was added. Make sure you understand how and when to take each. Changed by: Edmonia Lynch, MD   albuterol (2.5 MG/3ML) 0.083% nebulizer solution Commonly known as: PROVENTIL Take 3 mLs (2.5 mg total) by nebulization every 4 (four) hours as needed for wheezing or shortness of breath. What changed: You were already taking a medication with the same name, and this prescription was added. Make sure you understand how and when to take each. Changed by: Edmonia Lynch, MD   ALPRAZolam 0.25 MG tablet Commonly known as: XANAX Take 0.125 mg by mouth 3 (three) times daily as needed (for anxiety/panic attacks.).   Breztri Aerosphere 160-9-4.8 MCG/ACT Aero Generic  drug: Budeson-Glycopyrrol-Formoterol Inhale 2 puffs into the lungs in the morning and at bedtime. Started by: Edmonia Lynch, MD   calcium carbonate 500 MG chewable tablet Commonly known as: TUMS - dosed in mg elemental calcium Chew 1-2 tablets by mouth 3 (three) times daily as needed for indigestion or heartburn.   Carbinoxamine Maleate 4 MG Tabs Take 1 tablet (4 mg total) by mouth every 6 (six) hours. Use as needed. What changed: additional instructions Changed by: Edmonia Lynch, MD   ciclesonide 80 MCG/ACT inhaler Commonly known as: [place] Inhale 2 puffs into the lungs 2 (two) times daily.   [employer] Bismuth 262 MG chewable tablet Generic drug: bismuth subsalicylate Chew by mouth.   drospirenone-ethinyl estradiol 3-0.02 MG tablet Commonly known as: YAZ Take 1 tablet by mouth daily.   Fluticasone Propionate 93 MCG/ACT Exhu Commonly known as: Xhance Place 2 sprays into the nose 2 (two) times daily.   fluticasone 50 MCG/ACT nasal spray Commonly known as: FLONASE USE 1 SPRAY IN EACH NOSTRIL DAILY   hydrOXYzine 25 MG tablet Commonly known as: ATARAX/VISTARIL Take by mouth.   hyoscyamine 0.125 MG tablet Commonly known as: LEVSIN Take by mouth.   Hyoscyamine Sulfate SL 0.125 MG Subl PLACE 1  TABLET UNDER TONGUE EVERY 6 HOURS AS NEEDED FOR CRAMPING   ibuprofen 200 MG tablet Commonly known as: ADVIL Take 600 mg by mouth every 8 (eight) hours as needed (for pain.).   levothyroxine 50 MCG tablet Commonly known as: SYNTHROID Take 50 mcg by mouth daily before breakfast.   levothyroxine 75 MCG tablet Commonly known as: SYNTHROID Take 75 mcg by mouth daily.   montelukast 10 MG tablet Commonly known as: SINGULAIR Take 1 tablet (10 mg total) by mouth at bedtime.   ondansetron 4 MG tablet Commonly known as: ZOFRAN Take by mouth.   pantoprazole 40 MG tablet Commonly known as: PROTONIX TAKE 1 TABLET(40 MG) BY MOUTH TWICE DAILY BEFORE MEALS   prednisoLONE 15 MG/5ML Soln Commonly known as: PRELONE 5 mL 3 times per day 3 days, then 5 mL 2 times per day on day 4, then 5 mL on day 5, then stop. What changed: additional instructions Changed by: Edmonia Lynch, MD   Quillivant XR 25 MG/5ML Susr Generic drug: Methylphenidate HCl ER Take 25 mg by mouth daily as needed (for concentration).   Symbicort 160-4.5 MCG/ACT inhaler Generic drug: budesonide-formoterol USE 2 INHALATIONS BY MOUTH  TWICE DAILY   UNABLE TO FIND CBD oil for pain and CBD with melatonin for sleep       Allergies  Allergen Reactions  . Lactose Nausea And Vomiting    Lactose intolerance  . Lactose Intolerance (Gi)     UNSPECIFIED SEVERITY/REACTION  . Other Other (See Comments)    PET DANDER SEASONAL ALLERGIES ASTHMA "TREE NUTS" >> RASH  . Pollen Extract     UNSPECIFIED REACTION   . Coconut Oil Rash   Review of systems: Review of systems negative except as noted in HPI / PMHx.  Past Medical History:  Diagnosis Date  . ADHD   . Allergy   . Anemia    reports history of slight anemia  . Anxiety   . Asthma   . Coronary artery fistula    by echo per note 12/23/16 at [facility]  . Depression   . Ehlers-Danlos syndrome    diagnosed x84month ago  . GERD (gastroesophageal reflux  disease)   . H. pylori infection   . Heart murmur   .  Hyperprolactinemia ([place])   . Hypothyroidism   . POTS (postural orthostatic tachycardia syndrome)    mother reports that this was mainly when she was younger  . Urticaria   . Vision abnormalities     Family History  Adopted: Yes  Problem Relation Age of Onset  . Bipolar disorder Maternal Uncle   . Allergic rhinitis Neg Hx   . Asthma Neg Hx   . Eczema Neg Hx   . Urticaria Neg Hx     Social History   Socioeconomic History  . Marital status: Single    Spouse name: Not on file  . Number of children: Not on file  . Years of education: Not on file  . Highest education level: Not on file  Occupational History  . Not on file  Tobacco Use  . Smoking status: Never Smoker  . Smokeless tobacco: Never Used  Substance and Sexual Activity  . Alcohol use: No    Alcohol/week: 0.0 standard drinks  . Drug use: No  . Sexual activity: Never    Birth control/protection: Abstinence    Comment: insurance questions declined  Other Topics Concern  . Not on file  Social History Narrative  . Not on file   Social Determinants of Health   Financial Resource Strain:   . Difficulty of Paying Living Expenses:   Food Insecurity:   . Worried About Charity fundraiser in the Last Year:   . Arboriculturist in the Last Year:   Transportation Needs:   . Film/video editor (Medical):   Marland Kitchen Lack of Transportation (Non-Medical):   Physical Activity:   . Days of Exercise per Week:   . Minutes of Exercise per Session:   Stress:   . Feeling of Stress :   Social Connections:   . Frequency of Communication with Friends and Family:   . Frequency of Social Gatherings with Friends and Family:   . Attends Religious Services:   . Active Member of Clubs or Organizations:   . Attends Archivist Meetings:   Marland Kitchen Marital Status:   Intimate Partner Violence:   . Fear of Current or Ex-Partner:   . Emotionally Abused:   Marland Kitchen Physically Abused:     . Sexually Abused:     Previous notes and tests were reviewed.  I discussed the assessment and treatment plan with the patient. The patient was provided an opportunity to ask questions and all were answered. The patient agreed with the plan and demonstrated an understanding of the instructions.   The patient was advised to call back or seek an in-person evaluation if the symptoms worsen or if the condition fails to improve as anticipated.  I provided 30 minutes of non-face-to-face time during this encounter.  I appreciate the opportunity to take part in [place]'s care. Please do not hesitate to contact me with questions.  Sincerely,   R. Edgar Frisk, MD

## 2019-10-30 NOTE — Assessment & Plan Note (Signed)
   Continue meticulous avoidance of tree nuts and coconut and have access to epinephrine autoinjector 2 pack in case of accidental ingestion.  Food allergy action plan is in place.

## 2019-10-30 NOTE — Patient Instructions (Signed)
Severe persistent asthma Poorly controlled.  A prescription has been provided for prednisolone 15 mg/5 mL; 5 mL 3 times per day 3 days, then 5 mL 2 times per day on day 4, then 5 mL on day 5, then stop.  A prescription has been provided for BrezTri 160 g, 2 inhalations via spacer device twice daily.  Continue montelukast 10 mg daily at bedtime and albuterol every 4-6 hours if needed.  A refill prescription has been provided for albuterol 0.083% via nebulizer every 4-6 hours if needed.  The patient has been asked to contact me if her symptoms persist or progress. Otherwise, she may return for follow up in 6 weeks.  Perennial and seasonal allergic rhinitis  Continue appropriate aeroallergen avoidance measures, montelukast 10 mg daily, Xhance nasal spray as needed, and carbinoxamine 4 mg as needed.  Consider restarting aeroallergen immunotherapy injections.  Food allergy  Continue meticulous avoidance of tree nuts and coconut and have access to epinephrine autoinjector 2 pack in case of accidental ingestion.  Food allergy action plan is in place.   Return in about 4 months (around 02/29/2020), or if symptoms worsen or fail to improve.

## 2019-11-07 ENCOUNTER — Telehealth: Payer: Self-pay

## 2019-11-07 MED ORDER — MONTELUKAST SODIUM 10 MG PO TABS: 10.0000 mg | ORAL_TABLET | Freq: Every day | ORAL | 1 refills | Status: DC

## 2019-11-07 NOTE — Telephone Encounter (Signed)
Prescription has been sent to requested pharmacy. Called and advised to patient's mother. Patient's mother verbalized understanding.

## 2019-11-07 NOTE — Telephone Encounter (Signed)
Patients mom called requesting a change of pharmacy for the patients medication.  Mom would like the Singulair changed to [facility] for 90 days.  All the other prescriptions are fine.  Thanks

## 2019-12-12 ENCOUNTER — Other Ambulatory Visit: Payer: Self-pay | Admitting: Allergy and Immunology

## 2020-04-07 ENCOUNTER — Other Ambulatory Visit: Payer: Self-pay | Admitting: Allergy and Immunology

## 2020-05-03 ENCOUNTER — Other Ambulatory Visit: Payer: Self-pay | Admitting: Allergy and Immunology

## 2020-07-10 ENCOUNTER — Other Ambulatory Visit: Payer: Self-pay | Admitting: Allergy and Immunology

## 2020-07-11 ENCOUNTER — Telehealth: Payer: Self-pay | Admitting: Allergy and Immunology

## 2020-07-11 NOTE — Telephone Encounter (Signed)
Patient mom called and would like to buy a nebulizer, but does not want to get it the [facility]. Would like to know which one were the best. [phone].

## 2020-07-12 NOTE — Telephone Encounter (Signed)
Called and spoke with the patient's mother and advised that we provide a lot of nebulizers from [employer], and that they do have a website. Patient's mother verbalized understanding and she is going to look them up.

## 2020-09-12 ENCOUNTER — Other Ambulatory Visit: Payer: Self-pay | Admitting: Allergy and Immunology

## 2020-09-13 ENCOUNTER — Telehealth: Payer: Self-pay | Admitting: Allergy

## 2020-09-13 NOTE — Telephone Encounter (Signed)
Patient's mother called and states that Symbicort worked better than [facility] [employer] and would like it called into [facility] in [place]. Informed mother that patient was due back for an office visit in 02/2020. Mother states that patient is not ready to come into the office due to the pandemic. Informed mother that we may have to at least do a telephone visit.  Please advise.

## 2020-09-13 NOTE — Telephone Encounter (Signed)
Called and spoke to mom and scheduled her daughter with Dr.Kim in the [place] office for this coming Thursday. Mom expressed that  she would confirm with patient and that she would drive her daughter to the appointment.

## 2020-09-14 ENCOUNTER — Other Ambulatory Visit: Payer: Self-pay | Admitting: Allergy and Immunology

## 2020-09-19 ENCOUNTER — Other Ambulatory Visit: Payer: Self-pay

## 2020-09-19 ENCOUNTER — Ambulatory Visit: Payer: 59 | Admitting: Allergy

## 2020-09-23 ENCOUNTER — Telehealth: Payer: Self-pay

## 2020-09-23 NOTE — Telephone Encounter (Signed)
Spoke with mother (ok per DPR) letting her know that patient is due for an OV and Singulair refill has been denied x 2. She does have an appointment in April. Per mother if they need the medication before then they will call to see if we have anything sooner.

## 2020-10-09 DIAGNOSIS — D649 Anemia, unspecified: Secondary | ICD-10-CM | POA: Insufficient documentation

## 2020-10-15 DIAGNOSIS — H6121 Impacted cerumen, right ear: Secondary | ICD-10-CM | POA: Insufficient documentation

## 2020-11-19 ENCOUNTER — Other Ambulatory Visit: Payer: Self-pay

## 2020-11-19 ENCOUNTER — Ambulatory Visit (INDEPENDENT_AMBULATORY_CARE_PROVIDER_SITE_OTHER): Payer: 59 | Admitting: Allergy

## 2020-11-19 ENCOUNTER — Encounter: Payer: Self-pay | Admitting: Allergy

## 2020-11-19 VITALS — BP 102/68 | HR 98 | Temp 97.7°F | Resp 12 | Ht 64.5 in | Wt 117.0 lb

## 2020-11-19 DIAGNOSIS — J4550 Severe persistent asthma, uncomplicated: Secondary | ICD-10-CM | POA: Diagnosis not present

## 2020-11-19 DIAGNOSIS — T7800XA Anaphylactic reaction due to unspecified food, initial encounter: Secondary | ICD-10-CM

## 2020-11-19 DIAGNOSIS — L2089 Other atopic dermatitis: Secondary | ICD-10-CM | POA: Insufficient documentation

## 2020-11-19 DIAGNOSIS — J3089 Other allergic rhinitis: Secondary | ICD-10-CM

## 2020-11-19 HISTORY — DX: Other atopic dermatitis: L20.89

## 2020-11-19 MED ORDER — MONTELUKAST SODIUM 10 MG PO TABS: ORAL_TABLET | ORAL | 2 refills | Status: DC

## 2020-11-19 MED ORDER — ALBUTEROL SULFATE HFA 108 (90 BASE) MCG/ACT IN AERS: INHALATION_SPRAY | RESPIRATORY_TRACT | 1 refills | Status: DC

## 2020-11-19 MED ORDER — CARBINOXAMINE MALEATE 4 MG PO TABS: 1.0000 | ORAL_TABLET | Freq: Four times a day (QID) | ORAL | 2 refills | Status: AC | PRN

## 2020-11-19 MED ORDER — BUDESONIDE-FORMOTEROL FUMARATE 160-4.5 MCG/ACT IN AERO: INHALATION_SPRAY | RESPIRATORY_TRACT | 2 refills | Status: AC

## 2020-11-19 MED ORDER — AZELASTINE HCL 0.1 % NA SOLN: 1.0000 | Freq: Two times a day (BID) | NASAL | 3 refills | Status: DC | PRN

## 2020-11-19 MED ORDER — FLUTICASONE PROPIONATE 50 MCG/ACT NA SUSP: 1.0000 | Freq: Two times a day (BID) | NASAL | 2 refills | Status: AC | PRN

## 2020-11-19 NOTE — Assessment & Plan Note (Signed)
Dry skin on hands.  Continue proper skin care.

## 2020-11-19 NOTE — Assessment & Plan Note (Addendum)
Past history - used be on AIT (W/DM/C/D & M/CR) but stopped due to pandemic. Interim history - still having some rhinitis symptoms.  Continue appropriate aeroallergen avoidance measures  Continue montelukast 10 mg daily  May use Flonase (fluticasone) nasal spray 1 spray per nostril twice a day as needed for nasal congestion.   May use azelastine nasal spray 1-2 sprays per nostril twice a day as needed for runny nose/drainage.  Nasal saline spray (i.e., Simply Saline) or nasal saline lavage (i.e., NeilMed) is recommended as needed and prior to medicated nasal sprays.  Continue carbinoxamine 4mg  2-3 times per day.

## 2020-11-19 NOTE — Assessment & Plan Note (Signed)
Not well controlled asthma. Daily symptoms but not using albuterol. Breztri did not help. Likes Symbicort. No prior biologics. Last prednisone in April 2021.   Today's spirometry showed: severe obstructive disease with 35% improvement in FEV1 post bronchodilator treatment. Clinically feeling improved.  . Daily controller medication(s): START Spiriva 1.11mcg 2 puffs once a day - sample given. Demonstrated proper use. . Continue Symbicort 156mcg 2 puffs twice a day with spacer and rinse mouth afterwards. . Continue Singulair (montelukast) 10mg  daily at night. o Spacer given and demonstrated proper use with inhaler. Patient understood technique and all questions/concerned were addressed.  . May use albuterol rescue inhaler 2 puffs every 4 to 6 hours as needed for shortness of breath, chest tightness, coughing, and wheezing. May use albuterol rescue inhaler 2 puffs 5 to 15 minutes prior to strenuous physical activities. Monitor frequency of use.   Repeat spirometry at next visit.  If no improvement, consider adding on biologics next.

## 2020-11-19 NOTE — Assessment & Plan Note (Signed)
   Continue meticulous avoidance of tree nuts and coconut.  For mild symptoms you can take over the counter antihistamines such as Benadryl and monitor symptoms closely. If symptoms worsen or if you have severe symptoms including breathing issues, throat closure, significant swelling, whole body hives, severe diarrhea and vomiting, lightheadedness then inject epinephrine and seek immediate medical care afterwards.  Food allergy action plan is in place.

## 2020-11-19 NOTE — Progress Notes (Signed)
Follow Up Note  RE: Christine [name] MRN: [id number] DOB: 2000-11-27 Date of Office Visit: 11/19/2020  Referring provider: Hall Busing, MD Primary care provider: Hayden Rasmussen, MD  Chief Complaint: Asthma  History of Present Illness: I had the pleasure of seeing Christine [name] for a follow up visit at the [facility] on 11/19/2020. She is a 20 y.o. female, who is being followed for asthma, allergic rhinitis and food allergy. Her previous allergy office visit was on 10/30/2019 with Dr. Verlin Fester via telemedicine. Today is a regular follow up visit. She is accompanied today by her mother who provided/contributed to the history. Failed to follow up as recommended.   Severe persistent asthma Still having issues with coughing, shortness of breath, wheezing almost on a daily basis and takes about 20 minutes to resolve.  Currently on Symbicort 167mcg 2 puffs twice a day with spacer and rinsing mouth after each use.  Tried Breztri which did not help.  Using albuterol once every 2 weeks.  No recent ER/urgent care visits or prednisone use since the last visit.  No prior asthma biologics.   Perennial and seasonal allergic rhinitis Nasal congestion, PND since pollen is out. Currently taking carbinoxamine 2-3 times per day and singulair with some benefit. Takes Flonase 1 spray per nostril once a day with no nosebleeds.   Patient stopped allergy injections (W/DM/C/D & M/CR)  Patient taking hydroxyzine 37.5mg  BID for her abdominal issues and is seeing GI for this.   Food allergy Avoiding tree nuts and coconut for now.  No Epipen use.   Continue meticulous avoidance of tree nuts and coconut and have access to epinephrine autoinjector 2 pack in case of accidental ingestion.  Food allergy action plan is in place.   Assessment and Plan: Christine [name] is a 20 y.o. female with: Not well controlled severe persistent asthma Not well controlled asthma. Daily symptoms but  not using albuterol. Breztri did not help. Likes Symbicort. No prior biologics. Last prednisone in April 2021.   Today's spirometry showed: severe obstructive disease with 35% improvement in FEV1 post bronchodilator treatment. Clinically feeling improved.  . Daily controller medication(s): START Spiriva 1.73mcg 2 puffs once a day - sample given. Demonstrated proper use. . Continue Symbicort 156mcg 2 puffs twice a day with spacer and rinse mouth afterwards. . Continue Singulair (montelukast) 10mg  daily at night. o Spacer given and demonstrated proper use with inhaler. Patient understood technique and all questions/concerned were addressed.  . May use albuterol rescue inhaler 2 puffs every 4 to 6 hours as needed for shortness of breath, chest tightness, coughing, and wheezing. May use albuterol rescue inhaler 2 puffs 5 to 15 minutes prior to strenuous physical activities. Monitor frequency of use.   Repeat spirometry at next visit.  If no improvement, consider adding on biologics next.   Perennial and seasonal allergic rhinitis Past history - used be on AIT (W/DM/C/D & M/CR) but stopped due to pandemic. Interim history - still having some rhinitis symptoms.  Continue appropriate aeroallergen avoidance measures  Continue montelukast 10 mg daily  May use Flonase (fluticasone) nasal spray 1 spray per nostril twice a day as needed for nasal congestion.   May use azelastine nasal spray 1-2 sprays per nostril twice a day as needed for runny nose/drainage.  Nasal saline spray (i.e., Simply Saline) or nasal saline lavage (i.e., NeilMed) is recommended as needed and prior to medicated nasal sprays.  Continue carbinoxamine 4mg  2-3 times per day.   Food  allergy  Continue meticulous avoidance of tree nuts and coconut.  For mild symptoms you can take over the counter antihistamines such as Benadryl and monitor symptoms closely. If symptoms worsen or if you have severe symptoms including breathing  issues, throat closure, significant swelling, whole body hives, severe diarrhea and vomiting, lightheadedness then inject epinephrine and seek immediate medical care afterwards.  Food allergy action plan is in place.  Other atopic dermatitis Dry skin on hands.  Continue proper skin care.  Return in about 4 weeks (around 12/17/2020).  Meds ordered this encounter  Medications  . azelastine (ASTELIN) 0.1 % nasal spray    Sig: Place 1-2 sprays into both nostrils 2 (two) times daily as needed (nasal drainage). Use in each nostril as directed    Dispense:  30 mL    Refill:  3  . budesonide-formoterol (SYMBICORT) 160-4.5 MCG/ACT inhaler    Sig: USE 2 INHALATIONS BY MOUTH TWICE DAILY with spacer and rinse mouth afterwards.    Dispense:  30.6 g    Refill:  2  . montelukast (SINGULAIR) 10 MG tablet    Sig: TAKE 1 TABLET(10 MG) BY MOUTH AT BEDTIME    Dispense:  90 tablet    Refill:  2  . albuterol (VENTOLIN HFA) 108 (90 Base) MCG/ACT inhaler    Sig: INHALE 2 PUFFS INTO THE LUNGS EVERY 4 HOURS AS NEEDED FOR WHEEZING    Dispense:  18 g    Refill:  1  . fluticasone (FLONASE) 50 MCG/ACT nasal spray    Sig: Place 1 spray into both nostrils 2 (two) times daily as needed for allergies or rhinitis.    Dispense:  48 g    Refill:  2  . Carbinoxamine Maleate 4 MG TABS    Sig: Take 1 tablet (4 mg total) by mouth every 6 (six) hours as needed.    Dispense:  270 tablet    Refill:  2   Lab Orders  No laboratory test(s) ordered today    Diagnostics: Spirometry:  Tracings reviewed. Her effort: Good reproducible efforts. FVC: 3.24L FEV1: 1.94L, 57% predicted FEV1/FVC ratio: 60% Interpretation: Spirometry consistent with severe obstructive disease with 35% improvement in FEV1 post bronchodilator treatment. Clinically feeling improved.  .  Please see scanned spirometry results for details.  Medication List:  Current Outpatient Medications  Medication Sig Dispense Refill  . albuterol  (PROVENTIL) (2.5 MG/3ML) 0.083% nebulizer solution Take 3 mLs (2.5 mg total) by nebulization every 4 (four) hours as needed for wheezing or shortness of breath. 75 mL 1  . ALPRAZolam (XANAX) 0.25 MG tablet Take 0.125 mg by mouth 3 (three) times daily as needed (for anxiety/panic attacks.).     Marland Kitchen azelastine (ASTELIN) 0.1 % nasal spray Place 1-2 sprays into both nostrils 2 (two) times daily as needed (nasal drainage). Use in each nostril as directed 30 mL 3  . calcium carbonate (TUMS - DOSED IN MG ELEMENTAL CALCIUM) 500 MG chewable tablet Chew 1-2 tablets by mouth 3 (three) times daily as needed for indigestion or heartburn.    . drospirenone-ethinyl estradiol (YAZ,GIANVI,LORYNA) 3-0.02 MG tablet Take 1 tablet by mouth daily.    . hydrOXYzine (ATARAX/VISTARIL) 25 MG tablet Take by mouth.    . hyoscyamine (LEVSIN, ANASPAZ) 0.125 MG tablet Take by mouth.    . Hyoscyamine Sulfate SL 0.125 MG SUBL PLACE 1 TABLET UNDER TONGUE EVERY 6 HOURS AS NEEDED FOR CRAMPING    . ibuprofen (ADVIL,MOTRIN) 200 MG tablet Take 600 mg by mouth every 8 (  eight) hours as needed (for pain.).    Marland Kitchen levothyroxine (SYNTHROID) 75 MCG tablet Take 75 mcg by mouth daily.    . montelukast (SINGULAIR) 10 MG tablet Take 1 tablet (10 mg total) by mouth at bedtime. 90 tablet 1  . UNABLE TO FIND CBD oil for pain and CBD with melatonin for sleep    . albuterol (VENTOLIN HFA) 108 (90 Base) MCG/ACT inhaler INHALE 2 PUFFS INTO THE LUNGS EVERY 4 HOURS AS NEEDED FOR WHEEZING 18 g 1  . bismuth subsalicylate ([facility] BISMUTH) 262 MG chewable tablet Chew by mouth.    . budesonide-formoterol (SYMBICORT) 160-4.5 MCG/ACT inhaler USE 2 INHALATIONS BY MOUTH TWICE DAILY with spacer and rinse mouth afterwards. 30.6 g 2  . Carbinoxamine Maleate 4 MG TABS Take 1 tablet (4 mg total) by mouth every 6 (six) hours as needed. 270 tablet 2  . fluticasone (FLONASE) 50 MCG/ACT nasal spray Place 1 spray into both nostrils 2 (two) times daily as needed for allergies or  rhinitis. 48 g 2  . levothyroxine (SYNTHROID, LEVOTHROID) 50 MCG tablet Take 50 mcg by mouth daily before breakfast. (Patient not taking: Reported on 11/19/2020)    . montelukast (SINGULAIR) 10 MG tablet TAKE 1 TABLET(10 MG) BY MOUTH AT BEDTIME 90 tablet 2  . ondansetron (ZOFRAN) 4 MG tablet Take by mouth.    . pantoprazole (PROTONIX) 40 MG tablet TAKE 1 TABLET(40 MG) BY MOUTH TWICE DAILY BEFORE MEALS (Patient not taking: Reported on 11/19/2020)     No current facility-administered medications for this visit.   Allergies: Allergies  Allergen Reactions  . Lactose Nausea And Vomiting    Lactose intolerance  . Lactose Intolerance (Gi)     UNSPECIFIED SEVERITY/REACTION  . Other Other (See Comments)    PET DANDER SEASONAL ALLERGIES ASTHMA "TREE NUTS" >> RASH  . Pollen Extract     UNSPECIFIED REACTION   . Coconut Oil Rash   I reviewed her past medical history, social history, family history, and environmental history and no significant changes have been reported from her previous visit.  Review of Systems  Constitutional: Negative for appetite change, chills, fever and unexpected weight change.  HENT: Positive for congestion and postnasal drip. Negative for rhinorrhea.   Eyes: Negative for itching.  Respiratory: Positive for cough, chest tightness, shortness of breath and wheezing.   Gastrointestinal: Negative for abdominal pain.  Skin: Positive for rash.  Allergic/Immunologic: Positive for environmental allergies and food allergies.  Neurological: Negative for headaches.   Objective: BP 102/68   Pulse 98   Temp 97.7 F (36.5 C) (Temporal)   Resp 12   Ht 5' 4.5" (1.638 m)   Wt 117 lb (53.1 kg)   SpO2 99%   BMI 19.77 kg/m  Body mass index is 19.77 kg/m. Physical Exam Vitals and nursing note reviewed.  Constitutional:      Appearance: Normal appearance. She is well-developed.  HENT:     Head: Normocephalic and atraumatic.     Right Ear: Tympanic membrane and external ear  normal.     Left Ear: Tympanic membrane and external ear normal.     Nose: Nose normal.     Mouth/Throat:     Mouth: Mucous membranes are moist.     Pharynx: Oropharynx is clear.  Eyes:     Conjunctiva/sclera: Conjunctivae normal.  Cardiovascular:     Rate and Rhythm: Normal rate and regular rhythm.     Heart sounds: Normal heart sounds. No murmur heard.   Pulmonary:  Effort: Pulmonary effort is normal.     Breath sounds: Normal breath sounds. No wheezing, rhonchi or rales.  Musculoskeletal:     Cervical back: Neck supple.  Skin:    General: Skin is warm and dry.     Findings: Rash present.     Comments: Dry patches on the knuckles b/l  Neurological:     Mental Status: She is alert and oriented to person, place, and time.  Psychiatric:        Behavior: Behavior normal.    Previous notes and tests were reviewed. The plan was reviewed with the patient/family, and all questions/concerned were addressed.  It was my pleasure to see Shawnay today and participate in her care. Please feel free to contact me with any questions or concerns.  Sincerely,  Rexene Alberts, DO Allergy & Immunology  [facility] office: [place] office: [phone]

## 2020-11-19 NOTE — Patient Instructions (Addendum)
Severe persistent asthma . Daily controller medication(s): START Spiriva 1.31mcg 2 puffs once a day - sample given. Demonstrated proper use. . Continue Symbicort 175mcg 2 puffs twice a day with spacer and rinse mouth afterwards. . May use albuterol rescue inhaler 2 puffs every 4 to 6 hours as needed for shortness of breath, chest tightness, coughing, and wheezing. May use albuterol rescue inhaler 2 puffs 5 to 15 minutes prior to strenuous physical activities. Monitor frequency of use.  . Asthma control goals:  o Full participation in all desired activities (may need albuterol before activity) o Albuterol use two times or less a week on average (not counting use with activity) o Cough interfering with sleep two times or less a month o Oral steroids no more than once a year o No hospitalizations  Perennial and seasonal allergic rhinitis  Continue appropriate aeroallergen avoidance measures  Continue montelukast 10 mg daily  May use Flonase (fluticasone) nasal spray 1 spray per nostril twice a day as needed for nasal congestion.   May use azelastine nasal spray 1-2 sprays per nostril twice a day as needed for runny nose/drainage.  Nasal saline spray (i.e., Simply Saline) or nasal saline lavage (i.e., NeilMed) is recommended as needed and prior to medicated nasal sprays.  Continue carbinoxamine 2-3 times per day   Food allergy  Continue meticulous avoidance of tree nuts and coconut.  For mild symptoms you can take over the counter antihistamines such as Benadryl and monitor symptoms closely. If symptoms worsen or if you have severe symptoms including breathing issues, throat closure, significant swelling, whole body hives, severe diarrhea and vomiting, lightheadedness then inject epinephrine and seek immediate medical care afterwards.  Food allergy action plan is in place.  Follow up in 4 weeks in person in [place] to check on asthma status.

## 2020-12-17 ENCOUNTER — Ambulatory Visit: Payer: 59 | Admitting: Allergy

## 2021-01-11 ENCOUNTER — Other Ambulatory Visit: Payer: Self-pay | Admitting: Allergy

## 2021-01-13 NOTE — Telephone Encounter (Signed)
I called the patient to let them know we do not send in a yearly supply of albuterol inhalers. I was not able to leave a message due to the mailbox being full. I also called [facility] mail and pharmacy. She verbalized understanding that we do not send in yearly supplies if albuterol inhalers. I sent in a 30 day courtesy refill.

## 2021-06-25 ENCOUNTER — Other Ambulatory Visit: Payer: Self-pay | Admitting: Allergy

## 2021-08-06 ENCOUNTER — Other Ambulatory Visit: Payer: Self-pay | Admitting: Allergy

## 2021-09-18 ENCOUNTER — Other Ambulatory Visit: Payer: Self-pay | Admitting: Allergy

## 2021-12-04 ENCOUNTER — Other Ambulatory Visit: Payer: Self-pay | Admitting: Allergy

## 2022-04-30 ENCOUNTER — Telehealth: Payer: Self-pay

## 2022-04-30 ENCOUNTER — Other Ambulatory Visit: Payer: Self-pay

## 2022-04-30 DIAGNOSIS — K625 Hemorrhage of anus and rectum: Secondary | ICD-10-CM

## 2022-04-30 DIAGNOSIS — R109 Unspecified abdominal pain: Secondary | ICD-10-CM

## 2022-04-30 DIAGNOSIS — D509 Iron deficiency anemia, unspecified: Secondary | ICD-10-CM

## 2022-04-30 NOTE — Telephone Encounter (Signed)
Received secure email from Dr. Tarri Glenn in regards to scheduling a direct colon for this patient as requested by Dr. Benita Stabile, patient's current GI doctor. Colon is for history of abnormal CT with bowel inflammation, abdominal pain, IDA, and rectal bleeding with defecation. Unable to reach patient for scheduling, vm box full. Will try again at a later time. Patient will require a PV & will need records faxed to our office.

## 2022-04-30 NOTE — Telephone Encounter (Signed)
Patient has been scheduled for [place] 06/05/22 with pre-visit 05/18/22 also requested records for Dr. Tarri Glenn as requested.

## 2022-05-18 ENCOUNTER — Ambulatory Visit (AMBULATORY_SURGERY_CENTER): Payer: Self-pay

## 2022-05-18 VITALS — Ht 65.0 in | Wt 110.0 lb

## 2022-05-18 DIAGNOSIS — R109 Unspecified abdominal pain: Secondary | ICD-10-CM

## 2022-05-18 DIAGNOSIS — D509 Iron deficiency anemia, unspecified: Secondary | ICD-10-CM

## 2022-05-18 DIAGNOSIS — K625 Hemorrhage of anus and rectum: Secondary | ICD-10-CM

## 2022-05-18 MED ORDER — NA SULFATE-K SULFATE-MG SULF 17.5-3.13-1.6 GM/177ML PO SOLN: 1.0000 | ORAL | 0 refills | Status: DC

## 2022-05-18 NOTE — Progress Notes (Signed)
No egg or soy allergy known to patient  No issues known to pt with past sedation with any surgeries or procedures Patient denies ever being told they had issues or difficulty with intubation  No FH of Malignant Hyperthermia Pt is not on diet pills Pt is not on  home 02  Pt is not on blood thinners  Pt denies issues with constipation  No A fib or A flutter Have any cardiac testing pending--denied Pt instructed to use [employer] or GoodRx for a price reduction on prep   PV conducted over phone with pt and her mom. Suprep instructions reviewed and mailed to verified address with sample consent.  Patient's chart reviewed by Osvaldo Angst CNRA prior to previsit and patient appropriate for the [place].  Previsit completed and red dot placed by patient's name on their procedure day (on provider's schedule).

## 2022-05-28 ENCOUNTER — Encounter: Payer: Self-pay | Admitting: Gastroenterology

## 2022-06-03 ENCOUNTER — Telehealth: Payer: Self-pay | Admitting: Gastroenterology

## 2022-06-03 NOTE — Telephone Encounter (Signed)
Patient's mother called states patient has been taking advil  2-3 times a day and didn't pay attention the prep instructions. Wondering if patient could proceed with procedure on Friday. Requesting a call back. Thank you

## 2022-06-03 NOTE — Telephone Encounter (Signed)
Dr. Tarri Glenn,  Just making sure pt is ok to proceed.  I know we tell patients to stop NSAIDs 7 days prior to procedure and she has been taking Advil 2-3 times a day, not just occasionally.  Please advise.  Thanks, [employer]

## 2022-06-03 NOTE — Telephone Encounter (Signed)
Spoke with mother and told her Dr. Tarri Glenn recommendations.  Had several questions- all answered

## 2022-06-05 ENCOUNTER — Encounter: Payer: Self-pay | Admitting: Gastroenterology

## 2022-06-05 ENCOUNTER — Ambulatory Visit (AMBULATORY_SURGERY_CENTER): Payer: 59 | Admitting: Gastroenterology

## 2022-06-05 VITALS — BP 119/86 | HR 88 | Temp 98.6°F | Resp 13 | Ht 65.0 in | Wt 110.0 lb

## 2022-06-05 DIAGNOSIS — K6389 Other specified diseases of intestine: Secondary | ICD-10-CM

## 2022-06-05 DIAGNOSIS — K625 Hemorrhage of anus and rectum: Secondary | ICD-10-CM | POA: Diagnosis not present

## 2022-06-05 DIAGNOSIS — D509 Iron deficiency anemia, unspecified: Secondary | ICD-10-CM

## 2022-06-05 DIAGNOSIS — R109 Unspecified abdominal pain: Secondary | ICD-10-CM | POA: Diagnosis present

## 2022-06-05 MED ORDER — SODIUM CHLORIDE 0.9 % IV SOLN: 500.0000 mL | INTRAVENOUS | Status: DC

## 2022-06-05 MED ORDER — HYDROCORTISONE (PERIANAL) 2.5 % EX CREA: 1.0000 | TOPICAL_CREAM | Freq: Every day | CUTANEOUS | 1 refills | Status: DC | PRN

## 2022-06-05 NOTE — Progress Notes (Signed)
Referring Provider: Hayden Rasmussen, MD Primary Care Physician:  Hayden Rasmussen, MD   Indication for EGD:   Indication for Colonoscopy:  Colon cancer screening   IMPRESSION:   Need for colon cancer screening Appropriate candidate for monitored anesthesia care  PLAN: EGD Colonoscopy in the [facility] today   HPI: Christine [name] is a 21 y.o. female presents for diagnostic colonoscopy. Patient of Dr. Mathews [name] who has been evaluating the patient for an abnormal CT with bowel inflammation, abdominal pain, IDA, and rectal bleeding with defecation. He requested a direct access colonoscopy on her behalf.    No prior colonoscopy.  No known family history of colon cancer or polyps. No family history of uterine/endometrial cancer, pancreatic cancer or gastric/stomach cancer.   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 pylori infection    Heart murmur    Hyperprolactinemia (HCC)    Hypothyroidism    Other atopic dermatitis 11/19/2020   POTS (postural orthostatic tachycardia syndrome)    Urticaria    Vision abnormalities     Past Surgical History:  Procedure Laterality Date   HYMENECTOMY     MOUTH SURGERY      Current Outpatient Medications  Medication Sig Dispense Refill   albuterol (PROVENTIL) (2.5 MG/3ML) 0.083% nebulizer solution Take 3 mLs (2.5 mg total) by nebulization every 4 (four) hours as needed for wheezing or shortness of breath. 75 mL 1   albuterol (VENTOLIN HFA) 108 (90 Base) MCG/ACT inhaler USE 2 INHALATIONS BY MOUTH  INTO THE LUNGS EVERY 4  HOURS AS NEEDED FOR  WHEEZING 18 g 0   ALPRAZolam (XANAX) 0.25 MG tablet Take 0.125 mg by mouth 3 (three) times daily as needed (for  anxiety/panic attacks.).      budesonide-formoterol (SYMBICORT) 160-4.5 MCG/ACT inhaler USE 2 INHALATIONS BY MOUTH TWICE DAILY with spacer and rinse mouth afterwards. 30.6 g 2   Carbinoxamine Maleate 4 MG TABS Take 1 tablet (4 mg total) by mouth every 6 (six) hours as needed. 270 tablet 2   drospirenone-ethinyl estradiol (YAZ,GIANVI,LORYNA) 3-0.02 MG tablet Take 1 tablet by mouth daily.     DULoxetine (CYMBALTA) 30 MG capsule Take 30 mg by mouth 2 (two) times daily.     fluticasone (FLONASE) 50 MCG/ACT nasal spray Place 1 spray into both nostrils 2 (two) times daily as needed for allergies or rhinitis. 48 g 2   hydrOXYzine (ATARAX/VISTARIL) 25 MG tablet Take by mouth.     Hyoscyamine Sulfate SL 0.125 MG SUBL PLACE 1 TABLET UNDER TONGUE EVERY 6 HOURS AS NEEDED FOR CRAMPING     ibuprofen (ADVIL,MOTRIN) 200 MG tablet Take 600 mg by mouth every 8 (eight) hours as needed (for pain.).     levothyroxine (SYNTHROID) 75 MCG tablet Take 75 mcg by mouth daily.     montelukast (SINGULAIR) 10 MG tablet Take 1 tablet (10 mg total) by mouth at bedtime. 90 tablet 1   pantoprazole (PROTONIX) 40 MG tablet      UNABLE TO FIND CBD oil for pain and CBD with melatonin for sleep     ondansetron (ZOFRAN) 4 MG tablet Take  by mouth.     Current Facility-Administered Medications  Medication Dose Route Frequency Provider Last Rate Last Admin   0.9 %  sodium chloride infusion  500 mL Intravenous Continuous [place] Park, MD        Allergies as of 06/05/2022 - Review Complete 06/05/2022  Allergen Reaction Noted   Lactose Nausea And Vomiting 01/06/2018   Lactose intolerance (gi)  02/08/2017   Other Other (See Comments) 07/04/2013   Pollen extract  07/04/2013   Coconut (cocos nucifera) Rash 03/09/2018    Family History  Adopted: Yes  Problem Relation Age of Onset   Bipolar disorder Maternal Uncle    Allergic rhinitis Neg Hx    Asthma Neg Hx    Eczema Neg Hx    Urticaria Neg Hx      Physical  Exam: General:   Alert,  well-nourished, pleasant and cooperative in NAD Head:  Normocephalic and atraumatic. Eyes:  Sclera clear, no icterus.   Conjunctiva pink. Mouth:  No deformity or lesions.   Neck:  Supple; no masses or thyromegaly. Lungs:  Clear throughout to auscultation.   No wheezes. Heart:  Regular rate and rhythm; no murmurs. Abdomen:  Soft, non-tender, nondistended, normal bowel sounds, no rebound or guarding.  Msk:  Symmetrical. No boney deformities LAD: No inguinal or umbilical LAD Extremities:  No clubbing or edema. Neurologic:  Alert and  oriented x4;  grossly nonfocal Skin:  No obvious rash or bruise. Psych:  Alert and cooperative. Normal mood and affect.     Studies/Results: No results found.    [name] L. Tarri Glenn, MD, MPH 06/05/2022, 10:05 AM

## 2022-06-05 NOTE — Progress Notes (Signed)
Called to room to assist during endoscopic procedure.  Patient ID and intended procedure confirmed with present staff. Received instructions for my participation in the procedure from the performing physician.  

## 2022-06-05 NOTE — Progress Notes (Signed)
Vital signs checked by:AS  The patient states no changes in medical or surgical history since pre-visit screening on 05/18/22

## 2022-06-05 NOTE — Patient Instructions (Signed)
Thank you for letting us take care of your healthcare needs today. You may pickup a new RX for Anusol cream to your rectum as needed sparingly. Please see handout given to you on Hemorrhoids. Avoid NSAIDS, Ibuprofen Aspirin, Motrin and Aleve. Follow up with Dr. Matilde Bash as planned.     YOU HAD AN ENDOSCOPIC PROCEDURE TODAY AT [facility]:   Refer to the procedure report that was given to you for any specific questions about what was found during the examination.  If the procedure report does not answer your questions, please call your gastroenterologist to clarify.  If you requested that your care partner not be given the details of your procedure findings, then the procedure report has been included in a sealed envelope for you to review at your convenience later.  YOU SHOULD EXPECT: Some feelings of bloating in the abdomen. Passage of more gas than usual.  Walking can help get rid of the air that was put into your GI tract during the procedure and reduce the bloating. If you had a lower endoscopy (such as a colonoscopy or flexible sigmoidoscopy) you may notice spotting of blood in your stool or on the toilet paper. If you underwent a bowel prep for your procedure, you may not have a normal bowel movement for a few days.  Please Note:  You might notice some irritation and congestion in your nose or some drainage.  This is from the oxygen used during your procedure.  There is no need for concern and it should clear up in a day or so.  SYMPTOMS TO REPORT IMMEDIATELY:  Following lower endoscopy (colonoscopy or flexible sigmoidoscopy):  Excessive amounts of blood in the stool  Significant tenderness or worsening of abdominal pains  Swelling of the abdomen that is new, acute  Fever of 100F or higher   For urgent or emergent issues, a gastroenterologist can be reached at any hour by calling [phone]. Do not use MyChart messaging for urgent concerns.    DIET:  We do  recommend a small meal at first, but then you may proceed to your regular diet.  Drink plenty of fluids but you should avoid alcoholic beverages for 24 hours.  ACTIVITY:  You should plan to take it easy for the rest of today and you should NOT DRIVE or use heavy machinery until tomorrow (because of the sedation medicines used during the test).    FOLLOW UP: Our staff will call the number listed on your records the next business day following your procedure.  We will call around 7:15- 8:00 am to check on you and address any questions or concerns that you may have regarding the information given to you following your procedure. If we do not reach you, we will leave a message.     If any biopsies were taken you will be contacted by phone or by letter within the next 1-3 weeks.  Please call us at [phone] if you have not heard about the biopsies in 3 weeks.    SIGNATURES/CONFIDENTIALITY: You and/or your care partner have signed paperwork which will be entered into your electronic medical record.  These signatures attest to the fact that that the information above on your After Visit Summary has been reviewed and is understood.  Full responsibility of the confidentiality of this discharge information lies with you and/or your care-partner.

## 2022-06-05 NOTE — Progress Notes (Signed)
To pacu, VSS. Report to [email or web address] 

## 2022-06-05 NOTE — Op Note (Addendum)
[facility] Patient Name: Christine [name] Procedure Date: 06/05/2022 9:56 AM MRN: [id number] Endoscopist: Thornton Park MD, MD, [id number] Age: 21 Referring MD:  Date of Birth: 2001-03-29 Gender: Female Account #: [id number] Procedure:                Colonoscopy Indications:              Abdominal pain, Rectal bleeding, Unexplained iron                            deficiency anemia, Abnormal CT of the GI tract                           Patient of Dr. Mathews Robinsons who has been                            evaluating the patient for an abnormal CT with                            bowel inflammation, abdominal pain, IDA, and rectal                            bleeding with defecation. Medicines:                Monitored Anesthesia Care Procedure:                Pre-Anesthesia Assessment:                           - Prior to the procedure, a History and Physical                            was performed, and patient medications and                            allergies were reviewed. The patient's tolerance of                            previous anesthesia was also reviewed. The risks                            and benefits of the procedure and the sedation                            options and risks were discussed with the patient.                            All questions were answered, and informed consent                            was obtained. Prior Anticoagulants: The patient has                            taken no anticoagulant or antiplatelet agents. ASA  Grade Assessment: II - A patient with mild systemic                            disease. After reviewing the risks and benefits,                            the patient was deemed in satisfactory condition to                            undergo the procedure.                           After obtaining informed consent, the colonoscope                            was passed under direct vision.  Throughout the                            procedure, the patient's blood pressure, pulse, and                            oxygen saturations were monitored continuously. The                            Olympus PCF-H190DL (#[id number]) Colonoscope was                            introduced through the anus and advanced to the 8                            cm into the ileum. A second forward view of the                            right colon was performed. The colonoscopy was                            performed without difficulty. The patient tolerated                            the procedure well. The quality of the bowel                            preparation was good. The terminal ileum, ileocecal                            valve, appendiceal orifice, and rectum were                            photographed. Scope In: 10:15:54 AM Scope Out: 10:32:01 AM Scope Withdrawal Time: 0 hours 12 minutes 19 seconds  Total Procedure Duration: 0 hours 16 minutes 7 seconds  Findings:                 The perianal and digital rectal examinations were  normal except for a small external hemorrhoid.                           The colon (entire examined portion) appeared                            normal. Biopsies were taken with a cold forceps for                            histology. Estimated blood loss was minimal.                           The distal ileum contained a single non-bleeding                            aphtha. No stigmata of recent bleeding were seen.                            The mucosa was otherwise normal. This was biopsied                            with a cold forceps for histology. Estimated blood                            loss was minimal.                           The exam was otherwise without abnormality on                            direct and retroflexion views. Complications:            No immediate complications. Estimated Blood Loss:     Estimated blood  loss was minimal. Impression:               - Small external hemorrhoid.                           - The entire examined colon is normal. Biopsied.                           - Aphtha in the distal ileum. This may be due to                            NSAIDs. Biopsies obtained to exclude inflammatory                            bowel disease.                           - The examination was otherwise normal on direct                            and retroflexion views. Recommendation:           - Patient has a contact  number available for                            emergencies. The signs and symptoms of potential                            delayed complications were discussed with the                            patient. Return to normal activities tomorrow.                            Written discharge instructions were provided to the                            patient.                           - Resume previous diet.                           - Continue present medications.                           - Await pathology results.                           - No aspirin, ibuprofen, naproxen, or other                            non-steroidal anti-inflammatory drugs.                           - Start using Anusol HC 2.5% applied sparingly to                            your rectum twice daily.                           - Follow-up with Dr. Matilde Bash as previously planned. Thornton Park MD, MD 06/05/2022 10:43:53 AM This report has been signed electronically.

## 2022-06-08 ENCOUNTER — Telehealth: Payer: Self-pay

## 2022-06-08 NOTE — Telephone Encounter (Signed)
  Follow up Call-     06/05/2022    9:14 AM  Call back number  Post procedure Call Back phone  # [phone] to talk to mom Mickel Baas)  Permission to leave phone message Yes     Patient questions:  Do you have a fever, pain , or abdominal swelling? No. Pain Score  0 *  Have you tolerated food without any problems? Yes.    Have you been able to return to your normal activities? Yes.    Do you have any questions about your discharge instructions: Diet   No. Medications  No. Follow up visit  No.  Do you have questions or concerns about your Care? No.  Actions: * If pain score is 4 or above: No action needed, pain <4.

## 2022-06-09 ENCOUNTER — Encounter: Payer: Self-pay | Admitting: Gastroenterology

## 2022-06-09 NOTE — Progress Notes (Signed)
Please send a copy of the path and the procedure note to Dr. Mathews Robinsons.  Thanks.  KLB

## 2022-08-29 ENCOUNTER — Encounter (HOSPITAL_BASED_OUTPATIENT_CLINIC_OR_DEPARTMENT_OTHER): Payer: Self-pay | Admitting: Emergency Medicine

## 2022-08-29 ENCOUNTER — Other Ambulatory Visit: Payer: Self-pay

## 2022-08-29 ENCOUNTER — Emergency Department (HOSPITAL_BASED_OUTPATIENT_CLINIC_OR_DEPARTMENT_OTHER)
Admission: EM | Admit: 2022-08-29 | Discharge: 2022-08-29 | Disposition: A | Discharge status: Home / Self Care | Payer: 59 | Attending: Emergency Medicine | Admitting: Emergency Medicine

## 2022-08-29 DIAGNOSIS — R062 Wheezing: Secondary | ICD-10-CM | POA: Insufficient documentation

## 2022-08-29 DIAGNOSIS — R059 Cough, unspecified: Secondary | ICD-10-CM | POA: Diagnosis not present

## 2022-08-29 DIAGNOSIS — R0602 Shortness of breath: Secondary | ICD-10-CM | POA: Insufficient documentation

## 2022-08-29 DIAGNOSIS — J988 Other specified respiratory disorders: Secondary | ICD-10-CM

## 2022-08-29 MED ORDER — PREDNISONE 20 MG PO TABS: ORAL_TABLET | ORAL | 0 refills | Status: DC

## 2022-08-29 MED ORDER — PREDNISONE 20 MG PO TABS
40.0000 mg | ORAL_TABLET | Freq: Once | ORAL | Status: AC
  Administered 2022-08-29: 40 mg via ORAL
  Filled 2022-08-29: qty 2

## 2022-08-29 NOTE — ED Provider Notes (Signed)
[facility] Provider Note   CSN: [id number] Arrival date & time: 08/29/22  4315     History  Chief Complaint  Patient presents with   Shortness of Breath    Christine [name] is a 22 y.o. female.  22 yo F with a chief complaints of cough wheezing.  Going on for couple days.  She tells me that she ran out of her Singulair and has been unable to get it filled.  She then developed some cough and some wheezing.  Has been using breathing treatments at home with some improvement.  Had an episode this morning about 4 hours ago that required some nebulized treatments.  She tried to call her family doctor to get steroids prescribed but has been unable to get in touch with them.  Came here for evaluation.  No known sick contacts.   Shortness of Breath      Home Medications Prior to Admission medications   Medication Sig Start Date End Date Taking? Authorizing Provider  albuterol (PROVENTIL) (2.5 MG/3ML) 0.083% nebulizer solution Take 3 mLs (2.5 mg total) by nebulization every 4 (four) hours as needed for wheezing or shortness of breath. 10/30/19   Bobbitt, Sedalia Muta, MD  albuterol (VENTOLIN HFA) 108 (90 Base) MCG/ACT inhaler USE 2 INHALATIONS BY MOUTH  INTO THE LUNGS EVERY 4  HOURS AS NEEDED FOR  WHEEZING 01/13/21   Garnet Sierras, DO  ALPRAZolam Duanne Moron) 0.25 MG tablet Take 0.125 mg by mouth 3 (three) times daily as needed (for anxiety/panic attacks.).     [provider]  budesonide-formoterol (SYMBICORT) 160-4.5 MCG/ACT inhaler USE 2 INHALATIONS BY MOUTH TWICE DAILY with spacer and rinse mouth afterwards. 11/19/20   Garnet Sierras, DO  Carbinoxamine Maleate 4 MG TABS Take 1 tablet (4 mg total) by mouth every 6 (six) hours as needed. 11/19/20   Garnet Sierras, DO  drospirenone-ethinyl estradiol Sherrill Raring) 3-0.02 MG tablet Take 1 tablet by mouth daily.    [provider]  DULoxetine (CYMBALTA) 30 MG capsule Take 30 mg by mouth  2 (two) times daily. 04/23/22   [provider]  fluticasone (FLONASE) 50 MCG/ACT nasal spray Place 1 spray into both nostrils 2 (two) times daily as needed for allergies or rhinitis. 11/19/20   Garnet Sierras, DO  hydrocortisone (ANUSOL-HC) 2.5 % rectal cream Place 1 Application rectally daily as needed for hemorrhoids or anal itching. 06/05/22   Thornton Park, MD  hydrOXYzine (ATARAX/VISTARIL) 25 MG tablet Take by mouth. 09/20/19   [provider]  Hyoscyamine Sulfate SL 0.125 MG SUBL PLACE 1 TABLET UNDER TONGUE EVERY 6 HOURS AS NEEDED FOR CRAMPING 09/20/19   [provider]  ibuprofen (ADVIL,MOTRIN) 200 MG tablet Take 600 mg by mouth every 8 (eight) hours as needed (for pain.).    [provider]  levothyroxine (SYNTHROID) 75 MCG tablet Take 75 mcg by mouth daily. 08/21/19   [provider]  montelukast (SINGULAIR) 10 MG tablet Take 1 tablet (10 mg total) by mouth at bedtime. 10/30/19   Bobbitt, Sedalia Muta, MD  ondansetron (ZOFRAN) 4 MG tablet Take by mouth.    [provider]  pantoprazole (PROTONIX) 40 MG tablet  08/17/19   [provider]  predniSONE (DELTASONE) 20 MG tablet 2 tabs po daily x 4 days 08/29/22   Deno Etienne, DO  UNABLE TO FIND CBD oil for pain and CBD with melatonin for sleep    [provider]  Allergies    Lactose, Lactose intolerance (gi), Other, Pollen extract, and Coconut (cocos nucifera)    Review of Systems   Review of Systems  Respiratory:  Positive for shortness of breath.     Physical Exam Updated Vital Signs BP 124/78   Pulse (!) 101   Temp 97.9 F (36.6 C) (Oral)   Resp 17   SpO2 99%  Physical Exam Vitals and nursing note reviewed.  Constitutional:      General: She is not in acute distress.    Appearance: She is well-developed. She is not diaphoretic.  HENT:     Head: Normocephalic and atraumatic.     Comments: Swollen turbinates, posterior nasal drip, no noted sinus ttp, tm  normal bilaterally.   Eyes:     Pupils: Pupils are equal, round, and reactive to light.  Cardiovascular:     Rate and Rhythm: Normal rate and regular rhythm.     Heart sounds: No murmur heard.    No friction rub. No gallop.  Pulmonary:     Effort: Pulmonary effort is normal.     Breath sounds: No wheezing or rales.  Abdominal:     General: There is no distension.     Palpations: Abdomen is soft.     Tenderness: There is no abdominal tenderness.  Musculoskeletal:        General: No tenderness.     Cervical back: Normal range of motion and neck supple.  Skin:    General: Skin is warm and dry.  Neurological:     Mental Status: She is alert and oriented to person, place, and time.  Psychiatric:        Behavior: Behavior normal.     ED Results / Procedures / Treatments   Labs (all labs ordered are listed, but only abnormal results are displayed) Labs Reviewed - No data to display  EKG None  Radiology No results found.  Procedures Procedures    Medications Ordered in ED Medications  predniSONE (DELTASONE) tablet 40 mg (has no administration in time range)    ED Course/ Medical Decision Making/ A&P                             Medical Decision Making Risk Prescription drug management.   22 yo F with a chief complaints of cough congestion wheezing going on for couple days.  She is well-appearing and nontoxic.  Clear lung sounds for me.  No adventitious lung sounds.  Will start on burst of steroids.  PCP follow-up.  7:29 AM:  I have discussed the diagnosis/risks/treatment options with the patient and family.  Evaluation and diagnostic testing in the emergency department does not suggest an emergent condition requiring admission or immediate intervention beyond what has been performed at this time.  They will follow up with PCP. We also discussed returning to the ED immediately if new or worsening sx occur. We discussed the sx which are most concerning (e.g., sudden  worsening pain, fever, inability to tolerate by mouth) that necessitate immediate return. Medications administered to the patient during their visit and any new prescriptions provided to the patient are listed below.  Medications given during this visit Medications  predniSONE (DELTASONE) tablet 40 mg (has no administration in time range)     The patient appears reasonably screen and/or stabilized for discharge and I doubt any other medical condition or other [facility] requiring further screening, evaluation, or treatment in the ED at this  time prior to discharge.            Final Clinical Impression(s) / ED Diagnoses Final diagnoses:  Wheezing-associated respiratory infection (WARI)    Rx / DC Orders ED Discharge Orders          Ordered    predniSONE (DELTASONE) 20 MG tablet  Status:  Discontinued        08/29/22 0726    predniSONE (DELTASONE) 20 MG tablet        08/29/22 0729              Deno Etienne, DO 08/29/22 [phone]

## 2022-08-29 NOTE — ED Triage Notes (Signed)
Pt reports she is having asthma exacerbation for the past few days. Tried home inhaler but still feels bad.

## 2022-08-29 NOTE — Discharge Instructions (Signed)
Follow-up with your doctor in the office.  Below are the instructions for the max dosing of albuterol.  If you take this amount it will make you feel jittery.  If you need to use more than that the need to come back to the emergency department to be reevaluated.  Use your inhaler every 4 hours(6 puffs) while awake, return for sudden worsening shortness of breath, or if you need to use your inhaler more often.

## 2023-06-14 ENCOUNTER — Emergency Department (HOSPITAL_BASED_OUTPATIENT_CLINIC_OR_DEPARTMENT_OTHER): Payer: 59

## 2023-06-14 ENCOUNTER — Encounter (HOSPITAL_BASED_OUTPATIENT_CLINIC_OR_DEPARTMENT_OTHER): Payer: Self-pay | Admitting: Emergency Medicine

## 2023-06-14 ENCOUNTER — Inpatient Hospital Stay (HOSPITAL_BASED_OUTPATIENT_CLINIC_OR_DEPARTMENT_OTHER)
Admission: EM | Admit: 2023-06-14 | Discharge: 2023-06-16 | DRG: 062 | Disposition: A | Discharge status: Home / Self Care | Payer: 59 | Attending: Neurology | Admitting: Neurology

## 2023-06-14 ENCOUNTER — Other Ambulatory Visit: Payer: Self-pay

## 2023-06-14 DIAGNOSIS — F32A Depression, unspecified: Secondary | ICD-10-CM | POA: Diagnosis present

## 2023-06-14 DIAGNOSIS — Q2112 Patent foramen ovale: Secondary | ICD-10-CM

## 2023-06-14 DIAGNOSIS — Z91018 Allergy to other foods: Secondary | ICD-10-CM

## 2023-06-14 DIAGNOSIS — H53461 Homonymous bilateral field defects, right side: Principal | ICD-10-CM | POA: Diagnosis present

## 2023-06-14 DIAGNOSIS — G90A Postural orthostatic tachycardia syndrome (POTS): Secondary | ICD-10-CM | POA: Diagnosis present

## 2023-06-14 DIAGNOSIS — I63532 Cerebral infarction due to unspecified occlusion or stenosis of left posterior cerebral artery: Secondary | ICD-10-CM | POA: Diagnosis not present

## 2023-06-14 DIAGNOSIS — R29703 NIHSS score 3: Secondary | ICD-10-CM | POA: Diagnosis present

## 2023-06-14 DIAGNOSIS — E739 Lactose intolerance, unspecified: Secondary | ICD-10-CM | POA: Diagnosis present

## 2023-06-14 DIAGNOSIS — Q796 Ehlers-Danlos syndrome, unspecified: Secondary | ICD-10-CM

## 2023-06-14 DIAGNOSIS — E039 Hypothyroidism, unspecified: Secondary | ICD-10-CM | POA: Diagnosis present

## 2023-06-14 DIAGNOSIS — I10 Essential (primary) hypertension: Secondary | ICD-10-CM | POA: Diagnosis present

## 2023-06-14 DIAGNOSIS — E785 Hyperlipidemia, unspecified: Secondary | ICD-10-CM | POA: Diagnosis present

## 2023-06-14 DIAGNOSIS — I639 Cerebral infarction, unspecified: Secondary | ICD-10-CM | POA: Diagnosis present

## 2023-06-14 DIAGNOSIS — J45909 Unspecified asthma, uncomplicated: Secondary | ICD-10-CM | POA: Diagnosis present

## 2023-06-14 DIAGNOSIS — Z79899 Other long term (current) drug therapy: Secondary | ICD-10-CM

## 2023-06-14 DIAGNOSIS — F419 Anxiety disorder, unspecified: Secondary | ICD-10-CM | POA: Diagnosis present

## 2023-06-14 DIAGNOSIS — Z79890 Hormone replacement therapy (postmenopausal): Secondary | ICD-10-CM

## 2023-06-14 DIAGNOSIS — Z7952 Long term (current) use of systemic steroids: Secondary | ICD-10-CM

## 2023-06-14 DIAGNOSIS — K219 Gastro-esophageal reflux disease without esophagitis: Secondary | ICD-10-CM | POA: Diagnosis present

## 2023-06-14 DIAGNOSIS — Z7951 Long term (current) use of inhaled steroids: Secondary | ICD-10-CM

## 2023-06-14 LAB — DIFFERENTIAL
Abs Immature Granulocytes: 0.02 10*3/uL (ref 0.00–0.07)
Basophils Absolute: 0.1 10*3/uL (ref 0.0–0.1)
Basophils Relative: 1 %
Eosinophils Absolute: 0.1 10*3/uL (ref 0.0–0.5)
Eosinophils Relative: 1 %
Immature Granulocytes: 0 %
Lymphocytes Relative: 29 %
Lymphs Abs: 2.5 10*3/uL (ref 0.7–4.0)
Monocytes Absolute: 0.7 10*3/uL (ref 0.1–1.0)
Monocytes Relative: 8 %
Neutro Abs: 5 10*3/uL (ref 1.7–7.7)
Neutrophils Relative %: 61 %

## 2023-06-14 LAB — PROTIME-INR
INR: 1 (ref 0.8–1.2)
Prothrombin Time: 13.4 s (ref 11.4–15.2)

## 2023-06-14 LAB — COMPREHENSIVE METABOLIC PANEL
ALT: 10 U/L (ref 0–44)
AST: 19 U/L (ref 15–41)
Albumin: 3.4 g/dL — ABNORMAL LOW (ref 3.5–5.0)
Alkaline Phosphatase: 79 U/L (ref 38–126)
Anion gap: 7 (ref 5–15)
BUN: 15 mg/dL (ref 6–20)
CO2: 18 mmol/L — ABNORMAL LOW (ref 22–32)
Calcium: 8.8 mg/dL — ABNORMAL LOW (ref 8.9–10.3)
Chloride: 107 mmol/L (ref 98–111)
Creatinine, Ser: 0.78 mg/dL (ref 0.44–1.00)
GFR, Estimated: >60 mL/min (ref >60)
Glucose, Bld: 93 mg/dL (ref 70–99)
Potassium: 4.1 mmol/L (ref 3.5–5.1)
Sodium: 132 mmol/L — ABNORMAL LOW (ref 135–145)
Total Bilirubin: 0.4 mg/dL (ref <1.2)
Total Protein: 7.2 g/dL (ref 6.5–8.1)

## 2023-06-14 LAB — ETHANOL: Alcohol, Ethyl (B): <10 mg/dL (ref <10)

## 2023-06-14 LAB — CBC
HCT: 28.7 % — ABNORMAL LOW (ref 36.0–46.0)
Hemoglobin: 8.8 g/dL — ABNORMAL LOW (ref 12.0–15.0)
MCH: 22.6 pg — ABNORMAL LOW (ref 26.0–34.0)
MCHC: 30.7 g/dL (ref 30.0–36.0)
MCV: 73.8 fL — ABNORMAL LOW (ref 80.0–100.0)
Platelets: 398 10*3/uL (ref 150–400)
RBC: 3.89 MIL/uL (ref 3.87–5.11)
RDW: 17.4 % — ABNORMAL HIGH (ref 11.5–15.5)
WBC: 8.4 10*3/uL (ref 4.0–10.5)
nRBC: 0 % (ref 0.0–0.2)

## 2023-06-14 LAB — APTT: aPTT: 23 s — ABNORMAL LOW (ref 24–36)

## 2023-06-14 LAB — CBG MONITORING, ED: Glucose-Capillary: 85 mg/dL (ref 70–99)

## 2023-06-14 MED ORDER — TENECTEPLASE FOR STROKE: 0.2500 mg/kg | PACK | Freq: Once | INTRAVENOUS | Status: AC

## 2023-06-14 MED ORDER — IOHEXOL 350 MG/ML SOLN
75.0000 mL | Freq: Once | INTRAVENOUS | Status: AC | PRN
  Administered 2023-06-14: 75 mL via INTRAVENOUS

## 2023-06-14 MED ORDER — TENECTEPLASE FOR STROKE
PACK | INTRAVENOUS | Status: AC
  Administered 2023-06-14: 15 mg via INTRAVENOUS
  Filled 2023-06-14: qty 10

## 2023-06-14 NOTE — ED Triage Notes (Signed)
Patient reports loss of peripheral vision on the "right side."  This started at 2200.  Patient also reports on and off headache x 1 week.  Patient's left pupil larger than the right, but patient states that's been like that x 1 month after drinking expired tea.

## 2023-06-14 NOTE — ED Notes (Signed)
EDP made aware of patient.

## 2023-06-14 NOTE — ED Notes (Addendum)
Timeout for TNK. Dosage verified.

## 2023-06-14 NOTE — ED Notes (Signed)
Patient returned from CT. Tele-neurology at bedside. Assessment initiated.

## 2023-06-14 NOTE — ED Provider Notes (Signed)
[facility] Provider Note   CSN: [id number] Arrival date & time: 06/14/23  2246  An emergency department physician performed an initial assessment on this suspected stroke patient at 2310.  History {Add pertinent medical, surgical, social history, OB history to HPI:1} Chief Complaint  Patient presents with   Loss of Vision    Christine [name] is a 22 y.o. female.  Patient arrives with sudden onset loss of peripheral vision to her right side and p.m.  States he was resting in bed with his incurred.  She then noticed mild headache which really bothered her until she noticed her vision was affected.  This is never happened before.  No history of migraines.  Denies any focal weakness, numbness or tingling.  No difficulty speaking or difficulty swallowing.  No chest pain or shortness of breath.  She has never had this happen before.  Does not make a difference if she closes 1 eye the other which still has loss of vision in the right side of her periphery. Denies any history of blood clot.  Does take birth control.  No history of migraines.  Does have a history of asthma as well as POTS.  The history is provided by the patient.       Home Medications Prior to Admission medications   Medication Sig Start Date End Date Taking? Authorizing Provider  albuterol (PROVENTIL) (2.5 MG/3ML) 0.083% nebulizer solution Take 3 mLs (2.5 mg total) by nebulization every 4 (four) hours as needed for wheezing or shortness of breath. 10/30/19   Bobbitt, Heywood Iles, MD  albuterol (VENTOLIN HFA) 108 (90 Base) MCG/ACT inhaler USE 2 INHALATIONS BY MOUTH  INTO THE LUNGS EVERY 4  HOURS AS NEEDED FOR  WHEEZING 01/13/21   Ellamae Sia, DO  ALPRAZolam Prudy Feeler) 0.25 MG tablet Take 0.125 mg by mouth 3 (three) times daily as needed (for anxiety/panic attacks.).     [provider]  budesonide-formoterol (SYMBICORT) 160-4.5 MCG/ACT inhaler USE 2 INHALATIONS BY MOUTH  TWICE DAILY with spacer and rinse mouth afterwards. 11/19/20   Ellamae Sia, DO  Carbinoxamine Maleate 4 MG TABS Take 1 tablet (4 mg total) by mouth every 6 (six) hours as needed. 11/19/20   Ellamae Sia, DO  drospirenone-ethinyl estradiol Pierre Bali) 3-0.02 MG tablet Take 1 tablet by mouth daily.    [provider]  DULoxetine (CYMBALTA) 30 MG capsule Take 30 mg by mouth 2 (two) times daily. 04/23/22   [provider]  fluticasone (FLONASE) 50 MCG/ACT nasal spray Place 1 spray into both nostrils 2 (two) times daily as needed for allergies or rhinitis. 11/19/20   Ellamae Sia, DO  hydrocortisone (ANUSOL-HC) 2.5 % rectal cream Place 1 Application rectally daily as needed for hemorrhoids or anal itching. 06/05/22   Tressia Danas, MD  hydrOXYzine (ATARAX/VISTARIL) 25 MG tablet Take by mouth. 09/20/19   [provider]  Hyoscyamine Sulfate SL 0.125 MG SUBL PLACE 1 TABLET UNDER TONGUE EVERY 6 HOURS AS NEEDED FOR CRAMPING 09/20/19   [provider]  ibuprofen (ADVIL,MOTRIN) 200 MG tablet Take 600 mg by mouth every 8 (eight) hours as needed (for pain.).    [provider]  levothyroxine (SYNTHROID) 75 MCG tablet Take 75 mcg by mouth daily. 08/21/19   [provider]  montelukast (SINGULAIR) 10 MG tablet Take 1 tablet (10 mg total) by mouth at bedtime. 10/30/19   Bobbitt, Heywood Iles, MD  ondansetron (ZOFRAN) 4 MG tablet Take by mouth.  [provider]  pantoprazole (PROTONIX) 40 MG tablet  08/17/19   [provider]  predniSONE (DELTASONE) 20 MG tablet 2 tabs po daily x 4 days 08/29/22   Melene Plan, DO  UNABLE TO FIND CBD oil for pain and CBD with melatonin for sleep    [provider]      Allergies    Lactose, Lactose intolerance (gi), Other, Pollen extract, and Coconut (cocos nucifera)    Review of Systems   Review of Systems  Constitutional:  Negative for activity change, appetite change and fever.  HENT:  Negative  for congestion and rhinorrhea.   Eyes:  Positive for visual disturbance.  Respiratory:  Negative for cough, chest tightness and shortness of breath.   Cardiovascular:  Negative for chest pain.  Gastrointestinal:  Negative for abdominal pain, nausea and vomiting.  Genitourinary:  Negative for dysuria and hematuria.  Musculoskeletal:  Negative for arthralgias and myalgias.  Neurological:  Positive for headaches. Negative for dizziness, seizures and weakness.   all other systems are negative except as noted in the HPI and PMH.    Physical Exam Updated Vital Signs BP (!) 147/92 (BP Location: Left Arm)   Pulse (!) 115   Temp 98.3 F (36.8 C)   Resp 18   Ht 5\' 5"  (1.651 m)   Wt 52.2 kg   SpO2 100%   BMI 19.14 kg/m  Physical Exam Vitals and nursing note reviewed.  Constitutional:      General: She is not in acute distress.    Appearance: She is well-developed.  HENT:     Head: Normocephalic and atraumatic.     Mouth/Throat:     Pharynx: No oropharyngeal exudate.  Eyes:     Conjunctiva/sclera: Conjunctivae normal.     Pupils: Pupils are equal, round, and reactive to light.  Neck:     Comments: No meningismus. Cardiovascular:     Rate and Rhythm: Normal rate and regular rhythm.     Heart sounds: Normal heart sounds. No murmur heard. Pulmonary:     Effort: Pulmonary effort is normal. No respiratory distress.     Breath sounds: Normal breath sounds.  Abdominal:     Palpations: Abdomen is soft.     Tenderness: There is no abdominal tenderness. There is no guarding or rebound.  Musculoskeletal:        General: No tenderness. Normal range of motion.     Cervical back: Normal range of motion and neck supple.  Skin:    General: Skin is warm.  Neurological:     Mental Status: She is alert and oriented to person, place, and time.     Cranial Nerves: No cranial nerve deficit.     Motor: No abnormal muscle tone.     Coordination: Coordination normal.     Comments: R  hemianopsia CN 2-12 intact, no ataxia on finger to nose, no nystagmus, 5/5 strength throughout, no pronator drift, Romberg negative, normal gait.   Psychiatric:        Behavior: Behavior normal.     ED Results / Procedures / Treatments   Labs (all labs ordered are listed, but only abnormal results are displayed) Labs Reviewed  CBC - Abnormal; Notable for the following components:      Result Value   Hemoglobin 8.8 (*)    HCT 28.7 (*)    MCV 73.8 (*)    MCH 22.6 (*)    RDW 17.4 (*)    All other components within normal limits  DIFFERENTIAL  ETHANOL  PROTIME-INR  APTT  COMPREHENSIVE METABOLIC PANEL  RAPID URINE DRUG SCREEN, HOSP PERFORMED  URINALYSIS, ROUTINE W REFLEX MICROSCOPIC  PREGNANCY, URINE  CBG MONITORING, ED    EKG None  Radiology No results found.  Procedures Procedures  {Document cardiac monitor, telemetry assessment procedure when appropriate:1}  Medications Ordered in ED Medications - No data to display  ED Course/ Medical Decision Making/ A&P   {   Click here for ABCD2, HEART and other calculatorsREFRESH Note before signing :1}                              Medical Decision Making Amount and/or Complexity of Data Reviewed Labs: ordered. Decision-making details documented in ED Course. Radiology: ordered and independent interpretation performed. Decision-making details documented in ED Course. ECG/medicine tests: ordered and independent interpretation performed. Decision-making details documented in ED Course.   Sudden onset of R peripheral vision loss. No other neuro deficits.  No weakness in arms or legs.  No nonischemic.  Tongue is midline.  Does have right hemianopsia on exam.  No stroke was activated. {Document critical care time when appropriate:1} {Document review of labs and clinical decision tools ie heart score, Chads2Vasc2 etc:1}  {Document your independent review of radiology images, and any outside records:1} {Document your  discussion with family members, caretakers, and with consultants:1} {Document social determinants of health affecting pt's care:1} {Document your decision making why or why not admission, treatments were needed:1} Final Clinical Impression(s) / ED Diagnoses Final diagnoses:  None    Rx / DC Orders ED Discharge Orders     None

## 2023-06-15 ENCOUNTER — Inpatient Hospital Stay (HOSPITAL_COMMUNITY): Payer: 59

## 2023-06-15 DIAGNOSIS — G90A Postural orthostatic tachycardia syndrome (POTS): Secondary | ICD-10-CM | POA: Diagnosis present

## 2023-06-15 DIAGNOSIS — Z79899 Other long term (current) drug therapy: Secondary | ICD-10-CM | POA: Diagnosis not present

## 2023-06-15 DIAGNOSIS — E785 Hyperlipidemia, unspecified: Secondary | ICD-10-CM | POA: Diagnosis present

## 2023-06-15 DIAGNOSIS — K219 Gastro-esophageal reflux disease without esophagitis: Secondary | ICD-10-CM | POA: Diagnosis present

## 2023-06-15 DIAGNOSIS — H53461 Homonymous bilateral field defects, right side: Secondary | ICD-10-CM | POA: Diagnosis present

## 2023-06-15 DIAGNOSIS — I639 Cerebral infarction, unspecified: Secondary | ICD-10-CM

## 2023-06-15 DIAGNOSIS — F32A Depression, unspecified: Secondary | ICD-10-CM | POA: Diagnosis present

## 2023-06-15 DIAGNOSIS — I63532 Cerebral infarction due to unspecified occlusion or stenosis of left posterior cerebral artery: Secondary | ICD-10-CM | POA: Diagnosis present

## 2023-06-15 DIAGNOSIS — I6389 Other cerebral infarction: Secondary | ICD-10-CM

## 2023-06-15 DIAGNOSIS — Z91018 Allergy to other foods: Secondary | ICD-10-CM | POA: Diagnosis not present

## 2023-06-15 DIAGNOSIS — Z7951 Long term (current) use of inhaled steroids: Secondary | ICD-10-CM | POA: Diagnosis not present

## 2023-06-15 DIAGNOSIS — F419 Anxiety disorder, unspecified: Secondary | ICD-10-CM | POA: Diagnosis present

## 2023-06-15 DIAGNOSIS — Z79890 Hormone replacement therapy (postmenopausal): Secondary | ICD-10-CM | POA: Diagnosis not present

## 2023-06-15 DIAGNOSIS — E739 Lactose intolerance, unspecified: Secondary | ICD-10-CM | POA: Diagnosis present

## 2023-06-15 DIAGNOSIS — R29703 NIHSS score 3: Secondary | ICD-10-CM | POA: Diagnosis present

## 2023-06-15 DIAGNOSIS — E039 Hypothyroidism, unspecified: Secondary | ICD-10-CM | POA: Diagnosis present

## 2023-06-15 DIAGNOSIS — Q796 Ehlers-Danlos syndrome, unspecified: Secondary | ICD-10-CM | POA: Diagnosis not present

## 2023-06-15 DIAGNOSIS — I6300 Cerebral infarction due to thrombosis of unspecified precerebral artery: Secondary | ICD-10-CM

## 2023-06-15 DIAGNOSIS — J45909 Unspecified asthma, uncomplicated: Secondary | ICD-10-CM | POA: Diagnosis present

## 2023-06-15 DIAGNOSIS — Z7952 Long term (current) use of systemic steroids: Secondary | ICD-10-CM | POA: Diagnosis not present

## 2023-06-15 DIAGNOSIS — Q2112 Patent foramen ovale: Secondary | ICD-10-CM | POA: Diagnosis not present

## 2023-06-15 DIAGNOSIS — I10 Essential (primary) hypertension: Secondary | ICD-10-CM | POA: Diagnosis present

## 2023-06-15 LAB — ECHOCARDIOGRAM COMPLETE
AR max vel: 2.27 cm2
AV Area VTI: 2.34 cm2
AV Area mean vel: 2.23 cm2
AV Mean grad: 3 mm[Hg]
AV Peak grad: 5 mm[Hg]
Ao pk vel: 1.12 m/s
Area-P 1/2: 6.6 cm2
Height: 65 in
S' Lateral: 3 cm
Weight: 2054.4 [oz_av]

## 2023-06-15 LAB — LIPID PANEL
Cholesterol: 186 mg/dL (ref 0–200)
HDL: 76 mg/dL (ref >40)
LDL Cholesterol: 81 mg/dL (ref 0–99)
Total CHOL/HDL Ratio: 2.4 {ratio}
Triglycerides: 147 mg/dL (ref <150)
VLDL: 29 mg/dL (ref 0–40)

## 2023-06-15 LAB — RAPID URINE DRUG SCREEN, HOSP PERFORMED
Amphetamines: NOT DETECTED
Barbiturates: NOT DETECTED
Benzodiazepines: NOT DETECTED
Cocaine: NOT DETECTED
Opiates: NOT DETECTED
Tetrahydrocannabinol: NOT DETECTED

## 2023-06-15 LAB — URINALYSIS, ROUTINE W REFLEX MICROSCOPIC
Bilirubin Urine: NEGATIVE
Glucose, UA: NEGATIVE mg/dL
Ketones, ur: NEGATIVE mg/dL
Leukocytes,Ua: NEGATIVE
Nitrite: NEGATIVE
Protein, ur: NEGATIVE mg/dL
Specific Gravity, Urine: 1.01 (ref 1.005–1.030)
pH: 7 (ref 5.0–8.0)

## 2023-06-15 LAB — URINALYSIS, MICROSCOPIC (REFLEX): WBC, UA: NONE SEEN WBC/hpf (ref 0–5)

## 2023-06-15 LAB — MRSA NEXT GEN BY PCR, NASAL: MRSA by PCR Next Gen: NOT DETECTED

## 2023-06-15 LAB — HIV ANTIBODY (ROUTINE TESTING W REFLEX): HIV Screen 4th Generation wRfx: NONREACTIVE

## 2023-06-15 LAB — PREGNANCY, URINE: Preg Test, Ur: NEGATIVE

## 2023-06-15 LAB — HEMOGLOBIN A1C
Hgb A1c MFr Bld: 5.4 % (ref 4.8–5.6)
Mean Plasma Glucose: 108.28 mg/dL

## 2023-06-15 MED ORDER — LORAZEPAM 2 MG/ML IJ SOLN
1.0000 mg | Freq: Once | INTRAMUSCULAR | Status: AC
  Administered 2023-06-15: 1 mg via INTRAVENOUS
  Filled 2023-06-15: qty 1

## 2023-06-15 MED ORDER — PANTOPRAZOLE SODIUM 40 MG PO TBEC: 40.0000 mg | DELAYED_RELEASE_TABLET | Freq: Every day | ORAL | Status: DC

## 2023-06-15 MED ORDER — POTASSIUM CHLORIDE CRYS ER 20 MEQ PO TBCR: 40.0000 meq | EXTENDED_RELEASE_TABLET | Freq: Once | ORAL | Status: DC

## 2023-06-15 MED ORDER — FLUTICASONE PROPIONATE 50 MCG/ACT NA SUSP
1.0000 | Freq: Two times a day (BID) | NASAL | Status: DC | PRN
  Administered 2023-06-16: 1 via NASAL
  Filled 2023-06-15: qty 16

## 2023-06-15 MED ORDER — ACETAMINOPHEN 325 MG PO TABS
650.0000 mg | ORAL_TABLET | ORAL | Status: DC | PRN
  Administered 2023-06-15 – 2023-06-16 (×3): 650 mg via ORAL
  Filled 2023-06-15 (×3): qty 2

## 2023-06-15 MED ORDER — HYOSCYAMINE SULFATE 0.125 MG PO TBDP
0.1250 mg | ORAL_TABLET | Freq: Four times a day (QID) | ORAL | Status: DC | PRN
  Filled 2023-06-15: qty 1

## 2023-06-15 MED ORDER — ALPRAZOLAM 0.25 MG PO TABS
0.1250 mg | ORAL_TABLET | Freq: Three times a day (TID) | ORAL | Status: DC | PRN
  Administered 2023-06-15: 0.125 mg via ORAL
  Filled 2023-06-15: qty 1

## 2023-06-15 MED ORDER — LEVOTHYROXINE SODIUM 75 MCG PO TABS
75.0000 ug | ORAL_TABLET | Freq: Every day | ORAL | Status: DC
  Administered 2023-06-15: 75 ug via ORAL
  Filled 2023-06-15: qty 1

## 2023-06-15 MED ORDER — CHLORPHENIRAMINE MALEATE 4 MG PO TABS
4.0000 mg | ORAL_TABLET | Freq: Every day | ORAL | Status: DC
  Filled 2023-06-15 (×2): qty 1

## 2023-06-15 MED ORDER — SODIUM CHLORIDE 0.9% FLUSH
10.0000 mL | Freq: Two times a day (BID) | INTRAVENOUS | Status: DC
  Administered 2023-06-15: 10 mL via INTRAVENOUS

## 2023-06-15 MED ORDER — PANTOPRAZOLE SODIUM 20 MG PO TBEC
20.0000 mg | DELAYED_RELEASE_TABLET | Freq: Once | ORAL | Status: AC
  Administered 2023-06-15: 20 mg via ORAL
  Filled 2023-06-15: qty 1

## 2023-06-15 MED ORDER — STROKE: EARLY STAGES OF RECOVERY BOOK
Freq: Once | Status: AC
Start: 2023-06-15 — End: 2023-06-15
  Filled 2023-06-15: qty 1

## 2023-06-15 MED ORDER — CHLORHEXIDINE GLUCONATE CLOTH 2 % EX PADS
6.0000 | MEDICATED_PAD | Freq: Every day | CUTANEOUS | Status: DC
  Administered 2023-06-15: 6 via TOPICAL

## 2023-06-15 MED ORDER — ACETAMINOPHEN 650 MG RE SUPP: 650.0000 mg | RECTAL | Status: DC | PRN

## 2023-06-15 MED ORDER — NICARDIPINE HCL IN NACL 20-0.86 MG/200ML-% IV SOLN: 0.0000 mg/h | INTRAVENOUS | Status: DC | PRN

## 2023-06-15 MED ORDER — HYOSCYAMINE SULFATE 0.125 MG PO TBDP
0.1250 mg | ORAL_TABLET | Freq: Four times a day (QID) | ORAL | Status: DC | PRN
  Administered 2023-06-15 – 2023-06-16 (×2): 0.125 mg via SUBLINGUAL
  Filled 2023-06-15 (×3): qty 1

## 2023-06-15 MED ORDER — PANTOPRAZOLE SODIUM 40 MG PO TBEC
40.0000 mg | DELAYED_RELEASE_TABLET | Freq: Every day | ORAL | Status: DC
  Administered 2023-06-15 – 2023-06-16 (×2): 40 mg via ORAL
  Filled 2023-06-15 (×2): qty 1

## 2023-06-15 MED ORDER — LABETALOL HCL 5 MG/ML IV SOLN: 10.0000 mg | Freq: Once | INTRAVENOUS | Status: DC | PRN

## 2023-06-15 MED ORDER — ONDANSETRON HCL 4 MG PO TABS
4.0000 mg | ORAL_TABLET | ORAL | Status: DC | PRN
  Administered 2023-06-15: 4 mg via ORAL
  Filled 2023-06-15 (×2): qty 1

## 2023-06-15 MED ORDER — MOMETASONE FURO-FORMOTEROL FUM 200-5 MCG/ACT IN AERO
2.0000 | INHALATION_SPRAY | Freq: Two times a day (BID) | RESPIRATORY_TRACT | Status: DC
  Filled 2023-06-15: qty 8.8

## 2023-06-15 MED ORDER — HYDROXYZINE HCL 25 MG PO TABS
25.0000 mg | ORAL_TABLET | Freq: Every day | ORAL | Status: DC
  Administered 2023-06-15 – 2023-06-16 (×2): 25 mg via ORAL
  Filled 2023-06-15 (×2): qty 1

## 2023-06-15 MED ORDER — PANTOPRAZOLE SODIUM 40 MG IV SOLR
40.0000 mg | Freq: Every day | INTRAVENOUS | Status: DC
  Filled 2023-06-15: qty 10

## 2023-06-15 MED ORDER — ACETAMINOPHEN 160 MG/5ML PO SOLN: 650.0000 mg | ORAL | Status: DC | PRN

## 2023-06-15 MED ORDER — POTASSIUM CHLORIDE CRYS ER 20 MEQ PO TBCR: 20.0000 meq | EXTENDED_RELEASE_TABLET | Freq: Once | ORAL | Status: DC

## 2023-06-15 MED ORDER — ROSUVASTATIN CALCIUM 20 MG PO TABS: 20.0000 mg | ORAL_TABLET | Freq: Every day | ORAL | Status: DC

## 2023-06-15 MED ORDER — MONTELUKAST SODIUM 10 MG PO TABS
10.0000 mg | ORAL_TABLET | Freq: Every day | ORAL | Status: DC
  Administered 2023-06-15: 10 mg via ORAL
  Filled 2023-06-15: qty 1

## 2023-06-15 NOTE — Progress Notes (Signed)
SLP Cancellation Note  Patient Details Name: Christine [name] MRN: [id number] DOB: 03/01/2001   Cancelled treatment:       Reason Eval/Treat Not Completed: Patient declined testing at this time as she was trying to nap. Will f/u as able.     Mahala Menghini., M.A. CCC-SLP Acute Rehabilitation Services Office [phone]  Secure chat preferred  06/15/2023, 3:05 PM

## 2023-06-15 NOTE — Consult Note (Signed)
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 Her neurologic exam was notable for a right homonymous hemianopia and possible mild RLE hypoesthesia (not consistent). CT head was negative for any acute process, and CTA head/neck demonstrated a left P2 stenosis. Overall, her presentation is concerning for acute left PCA stroke, particularly given her P2 stenosis on CTA. After extensive discussion regarding the risks/benefits and alternatives of thrombolytics with patient/family, patient ultimately agreed to proceed with TNK administration (received at 11:58 PM). Patient will otherwise need to be admitted to the ICU for further post-TNK management and additional stroke workup (including MRI brain, TTE with bubble, telemetry, etc.).  Our recommendations are outlined below. Recommendations: IV Tenecteplase recommended.  I confirmed the following. (Patient name, DOB, MRN, Blood Pressure, dose of Thrombolytic and waste, weight completed by stretcher/scale not stated weight, have ED staff inform ED MD of thrombolytic decision) Thrombolytic bolus given Without Complication.   IV Tenecteplase Total Dose - 15.0 mg   Routine post Thrombolytic monitoring including neuro checks and blood pressure control during/after treatment Monitor blood pressure Check blood pressure and neuro assessment every 15 min for 2 h, then every 30 min for 6 h, and finally every hour for 16 h.  Manage Blood Pressure per post Thrombolytic protocol.        Follow designated hospital protocol for admission and post thrombolytic care       CT brain 24 hours post Thrombolytic       NPO until swallowing  screen performed and passed       No antiplatelet agents or anticoagulants (including heparin for DVT prophylaxis) in first 24 hours       No Foley catheter, nasogastric tube, arterial catheter or central venous catheter for 24 hr, unless absolutely necessary       Telemetry       Bedside swallow evaluation       HOB less than 30 degrees       Euglycemia       Avoid hyperthermia, PRN acetaminophen       DVT prophylaxis       Inpatient Neurology Consultation       Stroke evaluation as per inpatient neurology recommendations  Discussed with ED physician   ------------------------------------------------------------------------------  Advanced Imaging: CTA Head and Neck Completed.  LVO:No  Patient in not a candidate for NIR   Metrics: Last Known Well: 06/14/2023 22:00:00 Dispatch Time: 06/14/2023 23:26:13 Arrival Time: 06/14/2023 22:46:00 Initial Response Time: 06/14/2023 23:28:28 Symptoms: right-sided vision loss. Initial patient interaction: 06/14/2023 23:32:42 NIHSS Assessment Completed: 06/14/2023 23:40:50 Patient is a candidate for Thrombolytic. Thrombolytic Medical Decision: 06/14/2023 23:51:33 Needle Time: 06/14/2023 23:58:00 Weight Noted by Staff: 58.2 kg  I personally Reviewed the CT Head and it Showed no acute process  Primary Provider Notified of Diagnostic Impression and Management Plan on: 06/15/2023 00:03:05    ------------------------------------------------------------------------------  Extended thrombolytic management related to:     More extensive discussion on the management decision was requested by patient or family  Thrombolytic Contraindications:  Last Known Well > 4.5 hours: No CT Head showing hemorrhage: No Ischemic stroke within 3 months: No Severe head trauma within 3 months:  No Intracranial/intraspinal surgery within 3 months: No History of intracranial hemorrhage: No Symptoms and signs consistent with an SAH: No GI malignancy or GI  bleed within 21 days: No Coagulopathy: Platelets <100 000 /mm3, INR >1.7, aPTT>40 s, or PT >15 s: No Treatment dose of LMWH within the previous 24 hrs: No Use of NOACs in past 48 hours: No Glycoprotein IIb/IIIa receptor inhibitors use: No Symptoms consistent with infective endocarditis: No Suspected aortic arch dissection: No Intra-axial intracranial neoplasm: No  Thrombolytic Decision and Management Plan: Management with thrombolytic treatment was explained to the Patient as was risks and benefits and alternatives to the treatment. Patient agrees with the decision to proceed with thrombolytic treatment. . All questions were answered and the Patient expressed understanding of the treatment plan.   History of Present Illness: Patient is a 22 year old Female.  Patient was brought by private transportation with symptoms of right-sided vision loss. Patient was last known normal around 10 PM this evening. Around that time, patient was texting when she reported acute onset of right-sided binocular vision loss. She denied any history of similar symptoms and further denied any associated speech changes or focal numbness/weakness. She reports a headache related to allergies over the past week as well.   Past Medical History: Other PMH:  POTS, asthma, anxiety, asthma  Medications:  No Anticoagulant use  No Antiplatelet use Reviewed EMR for current medications  Allergies:  NKDA  Social History: Smoking: No  Family History:  There is no family history of premature cerebrovascular disease pertinent to this consultation  ROS : 14 Points Review of Systems was performed and was negative except mentioned in HPI.  Past Surgical History: There Is No Surgical History Contributory To Today's Visit   Examination: BP(161/97), Pulse(115), Blood Glucose(85) 1A: Level of Consciousness - Alert; keenly responsive + 0 1B: Ask Month and Age - Both Questions Right + 0 1C: Blink Eyes & Squeeze  Hands - Performs Both Tasks + 0 2: Test Horizontal Extraocular Movements - Normal + 0 3: Test Visual Fields - Complete Hemianopia + 2 4: Test Facial Palsy (Use Grimace if Obtunded) - Normal symmetry + 0 5A: Test Left Arm Motor Drift - No Drift for 10 Seconds + 0 5B: Test Right Arm Motor Drift - No Drift for 10 Seconds + 0 6A: Test Left Leg Motor Drift - No Drift for 5 Seconds + 0 6B: Test Right Leg Motor Drift - Drift, but doesn't hit bed + 1 7: Test Limb Ataxia (FNF/Heel-Shin) - No Ataxia + 0 8: Test Sensation - Normal; No sensory loss + 0 9: Test Language/Aphasia - Normal; No aphasia + 0 10: Test Dysarthria - Normal + 0 11: Test Extinction/Inattention - No abnormality + 0  NIHSS Score: 3 NIHSS Free Text : RLE hypoesthesia (though not consistent)  Pre-Morbid Modified Rankin Scale: 0 Points = No symptoms at all  Spoke with : Dr. Manus Gunning  This consult was conducted in real time using interactive audio and Immunologist. Patient was informed of the technology being used for this visit and agreed to proceed. Patient located in hospital and provider located at home/office setting.   Patient is being evaluated for possible acute neurologic impairment and high probability of imminent or life-threatening deterioration. I spent total of 40 minutes providing care to this patient, including time for face to face visit via telemedicine, review of medical records, imaging studies and discussion of findings with providers, the patient and/or family.  Dr Jill Alexanders De'Prey  [facility]  For Inpatient follow-up with [facility] physician please call [facility] at [phone]. As we are not an outpatient service for any post hospital discharge needs please contact the hospital for assistance.  If you have any questions for the [facility] physicians or need to reconsult for clinical or diagnostic changes please contact us via [facility] at [phone].

## 2023-06-15 NOTE — Evaluation (Signed)
Physical Therapy Evaluation Patient Details Name: Christine [name] MRN: [id number] DOB: Nov 10, 2000 Today's Date: 06/15/2023  History of Present Illness  22 yo female onset R hemianopia primary dx of cerebral infarct due to occlusion L PCA pmh ADHD, Allergy, Anemia, Anxiety, Asthma, Coronary artery fistula, Depression, EDS, GERD, H. pylori infection, Heart murmur, Hyperprolactinemia, Hypothyroidism, Other atopic dermatitis (11/19/2020), POTS, Urticaria, and Vision abnormalities  Clinical Impression  Patient presents with decreased mobility more due to prolonged bedrest, though also limited by R visual field cut.  She was also limited due to elevated HR into 170's with ambulation today.  Normally independent at home, though reports not working or in school due to GI issues though enjoys her cats at home.  Able to move in room, hallway and stairwell with S today, though feel she will benefit from skilled PT in the acute setting to address activity intolerance and to further compensatory training for vision deficits.  No follow up PT recommended at d/c.         If plan is discharge home, recommend the following: Assist for transportation   Can travel by private vehicle        Equipment Recommendations None recommended by PT  Recommendations for Other Services       Functional Status Assessment Patient has had a recent decline in their functional status and demonstrates the ability to make significant improvements in function in a reasonable and predictable amount of time.     Precautions / Restrictions Precautions Precautions: None      Mobility  Bed Mobility Overal bed mobility: Independent                  Transfers Overall transfer level: Modified independent                      Ambulation/Gait Ambulation/Gait assistance: Independent Gait Distance (Feet): 300 Feet Assistive device: None Gait Pattern/deviations: Step-through pattern, Decreased stride  length       General Gait Details: several stops due to HR up to 170's  Stairs Stairs: Yes Stairs assistance: Supervision Stair Management: One rail Left, Alternating pattern, Forwards Number of Stairs: 3 General stair comments: able to perform step through technique with rail  Wheelchair Mobility     Tilt Bed    Modified Rankin (Stroke Patients Only)       Balance Overall balance assessment: Independent                               Standardized Balance Assessment Standardized Balance Assessment : Dynamic Gait Index   Dynamic Gait Index Level Surface: Normal Change in Gait Speed: Normal Gait with Horizontal Head Turns: Normal Gait with Vertical Head Turns: Normal Gait and Pivot Turn: Normal Step Over Obstacle: Normal Step Around Obstacles: Normal Steps: Mild Impairment Total Score: 23       Pertinent Vitals/Pain Pain Assessment Pain Score: 4  Pain Location: L above head and L eye hurts Pain Descriptors / Indicators: Guarding, Headache Pain Intervention(s): Monitored during session, Premedicated before session, Repositioned    Home Living Family/patient expects to be discharged to:: Private residence Living Arrangements: Parent Available Help at Discharge: Family Type of Home: House Home Access: Level entry       Home Layout: One level Home Equipment: Grab bars - toilet;Grab bars - tub/shower (mothers bathroom has access if needed) Additional Comments: condo has carpet, two cat ( ace and jenta)  Prior Function Prior Level of Function : Independent/Modified Independent                     Extremity/Trunk Assessment   Upper Extremity Assessment Upper Extremity Assessment: Defer to OT evaluation    Lower Extremity Assessment Lower Extremity Assessment: Overall WFL for tasks assessed    Cervical / Trunk Assessment Cervical / Trunk Assessment: Normal  Communication   Communication Communication: No apparent difficulties   Cognition Arousal: Alert Behavior During Therapy: WFL for tasks assessed/performed Overall Cognitive Status: Within Functional Limits for tasks assessed                                          General Comments General comments (skin integrity, edema, etc.): HR max 174 with ambulation.  Mother and father in the room, reporets lives with mother; noted R visual field cut, patient turning head to compensate and can read signs on R side.  Educated mother on activities to work on compensatory training and for follow up with opthalmologist if continued issues    Exercises     Assessment/Plan    PT Assessment Patient needs continued PT services  PT Problem List Decreased activity tolerance       PT Treatment Interventions Therapeutic activities;Gait training;Patient/family education    PT Goals (Current goals can be found in the Care Plan section)  Acute Rehab PT Goals Patient Stated Goal: return to independent PT Goal Formulation: With patient/family Time For Goal Achievement: 06/22/23 Potential to Achieve Goals: Good    Frequency Min 1X/week     Co-evaluation               AM-PAC PT "6 Clicks" Mobility  Outcome Measure Help needed turning from your back to your side while in a flat bed without using bedrails?: None Help needed moving from lying on your back to sitting on the side of a flat bed without using bedrails?: None Help needed moving to and from a bed to a chair (including a wheelchair)?: None Help needed standing up from a chair using your arms (e.g., wheelchair or bedside chair)?: A Little Help needed to walk in hospital room?: A Little Help needed climbing 3-5 steps with a railing? : A Little 6 Click Score: 21    End of Session Equipment Utilized During Treatment: Gait belt Activity Tolerance: Patient tolerated treatment well Patient left: in bed;with call bell/phone within reach;with family/visitor present   PT Visit Diagnosis: Other  symptoms and signs involving the nervous system (R29.898)    Time: 1004-1030 PT Time Calculation (min) (ACUTE ONLY): 26 min   Charges:   PT Evaluation $PT Eval Low Complexity: 1 Low   PT General Charges $$ ACUTE PT VISIT: 1 Visit         Sheran Lawless, PT Acute Rehabilitation Services Office:[phone] 06/15/2023   Elray Mcgregor 06/15/2023, 3:42 PM

## 2023-06-15 NOTE — H&P (Signed)
NEUROLOGY H&P NOTE   Date of service: June 15, 2023 Patient Name: Christine [name] MRN:  [id number] DOB:  [birth year] Chief Complaint: "vision change"  History of Present Illness  Warrene Schmidbauer is a 22 y.o. female  has a past medical history of ADHD, Allergy, Anemia, Anxiety, Asthma, Coronary artery fistula, Depression, Ehlers-Danlos syndrome, GERD (gastroesophageal reflux disease), H. pylori infection, Heart murmur, Hyperprolactinemia (HCC), Hypothyroidism, Other atopic dermatitis (11/19/2020), POTS (postural orthostatic tachycardia syndrome), Urticaria, and Vision abnormalities.  She presents with acute onset right hemianopia that occurred when she was texting with a friend earlier tonight.   Last known well: 2200 Modified rankin score: 0 IV Thrombolysis: Yes  Thrombectomy: no, no LVO NIHSS components Score: Comment  1a Level of Conscious 0[x]  1[]  2[]  3[]      1b LOC Questions 0[x]  1[]  2[]       1c LOC Commands 0[x]  1[]  2[]       2 Best Gaze 0[x]  1[]  2[]       3 Visual 0[]  1[]  2[x]  3[]      4 Facial Palsy 0[x]  1[]  2[]  3[]      5a Motor Arm - left 0[x]  1[]  2[]  3[]  4[]  UN[]    5b Motor Arm - Right 0[x]  1[]  2[]  3[]  4[]  UN[]    6a Motor Leg - Left 0[x]  1[]  2[]  3[]  4[]  UN[]    6b Motor Leg - Right 0[x]  1[]  2[]  3[]  4[]  UN[]    7 Limb Ataxia 0[x]  1[]  2[]  3[]  UN[]     8 Sensory 0[]  1[x]  2[]  UN[]      9 Best Language 0[x]  1[]  2[]  3[]      10 Dysarthria 0[x]  1[]  2[]  UN[]      11 Extinct. and Inattention 0[x]  1[]  2[]       TOTAL: 3      Past History   Past Medical History:  Diagnosis Date   ADHD    Allergy    Anemia    reports history of slight anemia   Anxiety    Asthma    Coronary artery fistula    by echo per note 12/23/16 at [facility]   Depression    Ehlers-Danlos syndrome    diagnosed x3month ago   GERD (gastroesophageal reflux disease)    H. pylori infection    Heart murmur    Hyperprolactinemia (HCC)    Hypothyroidism    Other atopic dermatitis  11/19/2020   POTS (postural orthostatic tachycardia syndrome)    Urticaria    Vision abnormalities    Past Surgical History:  Procedure Laterality Date   HYMENECTOMY     MOUTH SURGERY     Family History  Adopted: Yes  Problem Relation Age of Onset   Bipolar disorder Maternal Uncle    Allergic rhinitis Neg Hx    Asthma Neg Hx    Eczema Neg Hx    Urticaria Neg Hx    Social History   Socioeconomic History   Marital status: Single    Spouse name: Not on file   Number of children: Not on file   Years of education: Not on file   Highest education level: Not on file  Occupational History   Not on file  Tobacco Use   Smoking status: Never   Smokeless tobacco: Never  Vaping Use   Vaping status: Never Used  Substance and Sexual Activity   Alcohol use: No    Alcohol/week: 0.0 standard drinks of alcohol   Drug use: No   Sexual activity: Never    Birth control/protection: Abstinence  Comment: insurance questions declined  Other Topics Concern   Not on file  Social History Narrative   Not on file   Social Determinants of Health   Financial Resource Strain: Not on file  Food Insecurity: Not on file  Transportation Needs: Not on file  Physical Activity: Not on file  Stress: Not on file  Social Connections: Not on file   Allergies  Allergen Reactions   Lactose Nausea And Vomiting    Lactose intolerance   Lactose Intolerance (Gi)     UNSPECIFIED SEVERITY/REACTION   Other Other (See Comments)    PET DANDER SEASONAL ALLERGIES ASTHMA "TREE NUTS" >> RASH   Pollen Extract     UNSPECIFIED REACTION    Coconut (Cocos Nucifera) Rash    Medications   Current Facility-Administered Medications:    acetaminophen (TYLENOL) tablet 650 mg, 650 mg, Oral, Q4H PRN **OR** acetaminophen (TYLENOL) 160 MG/5ML solution 650 mg, 650 mg, Per Tube, Q4H PRN **OR** acetaminophen (TYLENOL) suppository 650 mg, 650 mg, Rectal, Q4H PRN, Rejeana Brock, MD   Chlorhexidine Gluconate  Cloth 2 % PADS 6 each, 6 each, Topical, Daily, Dusten Ellinwood, Hardin Negus, MD   chlorpheniramine (CHLOR-TRIMETON) tablet 4 mg, 4 mg, Oral, Daily, Taygan Connell, Hardin Negus, MD   fluticasone (FLONASE) 50 MCG/ACT nasal spray 1 spray, 1 spray, Each Nare, BID PRN, Rejeana Brock, MD   hydrOXYzine (ATARAX) tablet 25 mg, 25 mg, Oral, Daily, Edia Pursifull, Hardin Negus, MD   labetalol (NORMODYNE) injection 10 mg, 10 mg, Intravenous, Once PRN **AND** nicardipine (CARDENE) 20mg  in 0.86% saline IV infusion (0.1 mg/ml), 0-15 mg/hr, Intravenous, Continuous PRN, Rejeana Brock, MD   levothyroxine (SYNTHROID) tablet 75 mcg, 75 mcg, Oral, Daily, Rejeana Brock, MD, 75 mcg at 06/15/23 1027   mometasone-formoterol (DULERA) 200-5 MCG/ACT inhaler 2 puff, 2 puff, Inhalation, BID, Rejeana Brock, MD   montelukast (SINGULAIR) tablet 10 mg, 10 mg, Oral, QHS, Mckenzye Cutright, Hardin Negus, MD   pantoprazole (PROTONIX) injection 40 mg, 40 mg, Intravenous, Daily, Rejeana Brock, MD   Vitals   Vitals:   06/15/23 0545 06/15/23 0600 06/15/23 0630 06/15/23 0645  BP: 118/75 120/79 131/87 131/87  Pulse: 98 100 (!) 111 (!) 104  Resp: 17 19 17 20   Temp:      TempSrc:      SpO2: 100% 100% 98% 98%  Weight:      Height:         Body mass index is 21.37 kg/m.  Physical Exam   Constitutional: Appears well-developed and well-nourished.  Psych: Affect appropriate to situation.  Eyes: No scleral injection.  HENT: No OP obstruction.  Head: Normocephalic.  Cardiovascular: Normal rate and regular rhythm.  Respiratory: Effort normal, non-labored breathing.  GI: Soft.  No distension. There is no tenderness.  Skin: WDI.   Neurologic Examination    Neuro: Mental Status: Patient is awake, alert, oriented to person, place, month, year, and situation. Patient is able to give a clear and coherent history. No signs of aphasia or neglect Cranial Nerves: II: Right hemianopia. Pupils are equal,  round, and reactive to light.   III,IV, VI: EOMI without ptosis or diploplia.  V: Facial sensation is symmetric to temperature VII: Facial movement is symmetric.  VIII: hearing is intact to voice X: Uvula elevates symmetrically XI: Shoulder shrug is symmetric. XII: tongue is midline without atrophy or fasciculations.  Motor: Tone is normal. Bulk is normal. 5/5 strength was present in all four extremities.  Sensory: Sensation is diminished on the  right Cerebellar: FNF and HKS are intact bilaterally        Labs   CBC:  Recent Labs  Lab 06/14/23 2316  WBC 8.4  NEUTROABS 5.0  HGB 8.8*  HCT 28.7*  MCV 73.8*  PLT 398   Basic Metabolic Panel:  Lab Results  Component Value Date   NA 132 (L) 06/14/2023   K 4.1 06/14/2023   CO2 18 (L) 06/14/2023   GLUCOSE 93 06/14/2023   BUN 15 06/14/2023   CREATININE 0.78 06/14/2023   CALCIUM 8.8 (L) 06/14/2023   GFRNONAA >60 06/14/2023   GFRAA >60 07/02/2017   Lipid Panel: No results found for: "LDLCALC" HgbA1c:  Lab Results  Component Value Date   HGBA1C 5.3 04/23/2016   Urine Drug Screen:     Component Value Date/Time   LABOPIA NONE DETECTED 06/14/2023 2352   COCAINSCRNUR NONE DETECTED 06/14/2023 2352   LABBENZ NONE DETECTED 06/14/2023 2352   AMPHETMU NONE DETECTED 06/14/2023 2352   THCU NONE DETECTED 06/14/2023 2352   LABBARB NONE DETECTED 06/14/2023 2352    Alcohol Level     Component Value Date/Time   ETH <10 06/14/2023 2316   INR  Lab Results  Component Value Date   INR 1.0 06/14/2023   APTT  Lab Results  Component Value Date   APTT 23 (L) 06/14/2023     CT Head without contrast(Personally reviewed): No acute changes  CT angio Head and Neck with contrast: P2 stenosis  Assessment   Lomie Deutch is a 22 y.o. female with a history of POTS, Erler's Danlos, atopic dermatitis, coronary artery fistula who presents with signs and symptoms most consistent with a left PCA stroke.  Certainly, this  is concerning in someone her age and further evaluation will be needed.  This stenosis could be a partially occlusive embolus, and she will need workup for hypercoagulable state as well as cardiac embolus source.  Of note, the patient takes birth control not for birth control, but rather for depression.  She states that she has had severe problems with the depression and is extremely hesitant to make any changes to her hormonal birth control.  Primary Diagnosis:  Cerebral infarction due to occlusion or stenosis of left posterior cerebral artery.     Recommendations  - HgbA1c, fasting lipid panel - MRI of the brain without contrast - Frequent neuro checks - Echocardiogram - Prophylactic therapy-none for 24 hours - Risk factor modification - Telemetry monitoring - PT consult, OT consult, Speech consult - Stroke team to follow  Hypothyroidism -Continue Synthroid  Asthma -Continue albuterol -Substitute Dulera for Symbicort while inpatient -Continue montelukast  Allergies -Continue fluticasone -Continue hydroxyzine - Continue carbinoxamine (I am not sure why she is taking two antihistamines, but we will continue this as it is her home regimen)   Depression: -May need to consider continuing birth control given her history of difficult to treat depression and the benefits that she has had from this, but it does increase coagulability and therefore may need to hold it, at least in the short-term.  GERD -Continue home pantoprazole  ______________________________________________________________________   Stormy Card, MD [facility]

## 2023-06-15 NOTE — Progress Notes (Signed)
Code Stroke activated at 2310. VAN + for Visual changes unable to see out of her right Preefer.Patient states she was texting in bed when she began to have trouble seeing.  LKW 2200.   To CT at 2316. Returned from CT at 2332.  Teles[facility]ist paged at 2326. Dr.De'prey on camera for report and neuro eval (Neg non con report given). At 2344 CTA results given (see report for details). Began Discussing TNK at 2336 agreed to TNK at 2351. TNK time out at 2353. Needle time at 2358. TSRN on for monitoring post TNK. Bedside RN educated on evaluation times and signs and symptoms to watch for.   Patient tolerating well. Bedside RN informed of protocol to hit the button and order non con ct if patient shows adverse side affects so we can help guide them through it. TSRN off camera at 2327.

## 2023-06-15 NOTE — Evaluation (Signed)
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 pylori infection, Heart murmur, Hyperprolactinemia, Hypothyroidism, Other atopic dermatitis (11/19/2020), POTS, Urticaria, and Vision abnormalities   Clinical Impression   PT admitted with CVA with R hemiopsia . Pt currently with functional limitiations due to the deficits listed below (see OT problem list). Pt at baseline indep with all task but does not drive. Pt at this time is at adequate level for home with family (A). Pt could benefit from visual scanning task. OT reviewed signs/ symptoms of stroke with stroke booklet present in the room.  Pt will benefit from skilled OT to increase their independence and safety with adls and balance to allow discharge home.        If plan is discharge home, recommend the following: Assist for transportation    Functional Status Assessment  Patient has had a recent decline in their functional status and demonstrates the ability to make significant improvements in function in a reasonable and predictable amount of time.  Equipment Recommendations  None recommended by OT    Recommendations for Other Services       Precautions / Restrictions Precautions Precautions: None Restrictions Weight Bearing Restrictions: No      Mobility Bed Mobility Overal bed mobility: Independent                  Transfers Overall transfer level: Modified independent                        Balance Overall balance assessment: No apparent balance deficits (not formally assessed) (see PT noted for formal testing)                                         ADL either performed or assessed with  clinical judgement   ADL Overall ADL's : Modified independent                                       General ADL Comments: pt able to complete grooming at sink, toilet hygiene, throwing tissue into the trash can , gathering items, dressing UB, scanning room for items     Vision Baseline Vision/History: 1 Wears glasses Patient Visual Report: Blurring of vision Vision Assessment?: Vision impaired- to be further tested in functional context;Wears glasses for driving;Wears glasses for reading Additional Comments: pt reports pain in L eye and R eye deficits. pt without any vision in R eye starting at midline to the R visual field upper and lower quadrants. pt able to scan all quadrants in L eye. pt turnin head slightly L to read handouts without deficits. pt reports "typos with texting" pt encouraged to turn on autocorrect to help with frustration with texting     Perception Perception: Within Functional Limits       Praxis         Pertinent Vitals/Pain Pain Assessment Pain Assessment: 0-10 Pain Score: 4  Pain Location: L above head and L eye hurts Pain Descriptors / Indicators: Guarding, Headache Pain Intervention(s): Monitored during session, Premedicated before session,  Repositioned     Extremity/Trunk Assessment Upper Extremity Assessment Upper Extremity Assessment: Right hand dominant   Lower Extremity Assessment Lower Extremity Assessment: Overall WFL for tasks assessed   Cervical / Trunk Assessment Cervical / Trunk Assessment: Normal   Communication Communication Communication: No apparent difficulties   Cognition Arousal: Alert Behavior During Therapy: WFL for tasks assessed/performed Overall Cognitive Status: Within Functional Limits for tasks assessed                                       General Comments  HR Max 174 during session    Exercises     Shoulder Instructions      Home Living Family/patient expects to be  discharged to:: Private residence Living Arrangements: Parent (mother lives with parent) Available Help at Discharge: Family Type of Home: House Home Access: Level entry     Home Layout: One level     Bathroom Shower/Tub: Chief Strategy Officer: Handicapped height Bathroom Accessibility: Yes How Accessible: Accessible via walker;Accessible via wheelchair (mother bathroom) Home Equipment: Grab bars - toilet;Grab bars - tub/shower (mothers bathroom has access if needed)   Additional Comments: condo has carpet, two cat ( ace and jenta)      Prior Functioning/Environment Prior Level of Function : Independent/Modified Independent                        OT Problem List: Impaired vision/perception      OT Treatment/Interventions: Self-care/ADL training;DME and/or AE instruction;Therapeutic activities;Visual/perceptual remediation/compensation;Patient/family education;Balance training    OT Goals(Current goals can be found in the care plan section) Acute Rehab OT Goals Patient Stated Goal: to get something to drink OT Goal Formulation: With patient Time For Goal Achievement: 06/28/23 Potential to Achieve Goals: Good  OT Frequency: Min 1X/week    Co-evaluation              AM-PAC OT "6 Clicks" Daily Activity     Outcome Measure Help from another person eating meals?: None Help from another person taking care of personal grooming?: None Help from another person toileting, which includes using toliet, bedpan, or urinal?: None Help from another person bathing (including washing, rinsing, drying)?: None Help from another person to put on and taking off regular upper body clothing?: None Help from another person to put on and taking off regular lower body clothing?: None 6 Click Score: 24   End of Session Equipment Utilized During Treatment: Gait belt Nurse Communication: Mobility status;Precautions  Activity Tolerance: Patient tolerated treatment  well Patient left: in bed;with call bell/phone within reach;with family/visitor present  OT Visit Diagnosis: Unsteadiness on feet (R26.81)                Time: 1004-1030 OT Time Calculation (min): 26 min Charges:  OT General Charges $OT Visit: 1 Visit OT Evaluation $OT Eval Moderate Complexity: 1 Mod   Brynn, OTR/L  Acute Rehabilitation Services Office: [phone] .   Mateo Flow 06/15/2023, 10:41 AM

## 2023-06-15 NOTE — Progress Notes (Signed)
TCD bubble study and BLEV duplex exam have been completed.   Results can be found under chart review under CV PROC. 06/15/2023 4:05 PM Analisse Randle RVT, RDMS

## 2023-06-15 NOTE — Plan of Care (Signed)
Christine [name] is progressing well after TNK treatment overnight. Christine [name] is a great advocate for herself and has extensive knowledge about her health history. Christine [name] asks appropriate questions and is very receptive to education. Christine [name] and both her parents are active in her plan of care.    Problem: Education: Goal: Knowledge of disease or condition will improve Outcome: Progressing Goal: Knowledge of secondary prevention will improve (MUST DOCUMENT ALL) Outcome: Progressing Goal: Knowledge of patient specific risk factors will improve Loraine Leriche N/A or DELETE if not current risk factor) Outcome: Progressing   Problem: Health Behavior/Discharge Planning: Goal: Goals will be collaboratively established with patient/family Outcome: Progressing   Problem: Self-Care: Goal: Ability to participate in self-care as condition permits will improve Outcome: Progressing   Problem: Nutrition: Goal: Risk of aspiration will decrease Outcome: Progressing

## 2023-06-15 NOTE — ED Notes (Signed)
[facility] at bedside. Report given. Patient stable for transport.

## 2023-06-15 NOTE — Progress Notes (Signed)
*  PRELIMINARY RESULTS* Echocardiogram 2D Echocardiogram has been performed.  Christine [name] 06/15/2023, 2:36 PM

## 2023-06-15 NOTE — Progress Notes (Addendum)
STROKE TEAM PROGRESS NOTE   BRIEF HPI Christine [name] is a 22 y.o. female past medical history of ADHD,Depression, Anxiety, Asthma, Coronary artery fistula, Anemia, Ehlers-Danlos syndrome, GERD, H. pylori infection, Heart murmur, Hyperprolactinemia, Hypothyroidism, POTS (postural orthostatic tachycardia syndrome), urticaria and allergies. She presented with acute onset right hemianopia that occurred when she was texting with a friend Monday night.    Last known well: 2200 11/18 Modified rankin score: 0 IV Thrombolysis: Yes  Thrombectomy: no, no LVO  NIH on Admission 3  SIGNIFICANT HOSPITAL EVENTS CTA moderate narrowing in L P2, may represent post stenotic dilatation  TNK given 06/14/2023 23:58:00    INTERIM HISTORY/SUBJECTIVE Patient was awake at night texting with a friend noticed new onset right sided vision loss. Received TNK and continues to have difficulty seeing from right side and mild sensation loss in right leg. Denied weakness. Denies history of miscarriages, PE, or DVT. Denies a current rash, tick or bug bites. Denies history of migraines. Reports sinus headaches and some seasonal allergies. Denies issues with memory. Patient reports that her depression has been managed well on OCPs for several years. Has tried alternatives but nothing has been as successful. Discussed the increased risk of stroke with use of OCPs and recommended holding acutely for the moment. Patient anxious about holding hormonal contraceptives. Patient also anxious about further testing and required sedation with ativan to get images.   Patient reports GI issues and has been evaluated by 3 GI specialist.  Blood pressure adequately controlled. OBJECTIVE  CBC    Component Value Date/Time   WBC 8.4 06/14/2023 2316   RBC 3.89 06/14/2023 2316   HGB 8.8 (L) 06/14/2023 2316   HCT 28.7 (L) 06/14/2023 2316   PLT 398 06/14/2023 2316   MCV 73.8 (L) 06/14/2023 2316   MCH 22.6 (L) 06/14/2023 2316   MCHC 30.7  06/14/2023 2316   RDW 17.4 (H) 06/14/2023 2316   LYMPHSABS 2.5 06/14/2023 2316   MONOABS 0.7 06/14/2023 2316   EOSABS 0.1 06/14/2023 2316   BASOSABS 0.1 06/14/2023 2316    BMET    Component Value Date/Time   NA 132 (L) 06/14/2023 2316   K 4.1 06/14/2023 2316   CL 107 06/14/2023 2316   CO2 18 (L) 06/14/2023 2316   GLUCOSE 93 06/14/2023 2316   BUN 15 06/14/2023 2316   CREATININE 0.78 06/14/2023 2316   CALCIUM 8.8 (L) 06/14/2023 2316   GFRNONAA >60 06/14/2023 2316    IMAGING past 24 hours CT ANGIO HEAD NECK W WO CM (CODE STROKE)  Result Date: 06/14/2023 CLINICAL DATA:  Loss of vision EXAM: CT ANGIOGRAPHY HEAD AND NECK WITH AND WITHOUT CONTRAST TECHNIQUE: Multidetector CT imaging of the head and neck was performed using the standard protocol during bolus administration of intravenous contrast. Multiplanar CT image reconstructions and MIPs were obtained to evaluate the vascular anatomy. Carotid stenosis measurements (when applicable) are obtained utilizing NASCET criteria, using the distal internal carotid diameter as the denominator. RADIATION DOSE REDUCTION: This exam was performed according to the departmental dose-optimization program which includes automated exposure control, adjustment of the mA and/or kV according to patient size and/or use of iterative reconstruction technique. CONTRAST:  75mL OMNIPAQUE IOHEXOL 350 MG/ML SOLN COMPARISON:  No prior CTA available, correlation is made with same day CT head FINDINGS: CT HEAD FINDINGS For noncontrast findings, please see same day CT head. CTA NECK FINDINGS Aortic arch: Standard branching. Imaged portion shows no evidence of aneurysm or dissection. No significant stenosis of the major arch vessel  origins. Right carotid system: No evidence of stenosis, dissection, or occlusion. Left carotid system: No evidence of stenosis, dissection, or occlusion. Vertebral arteries: No evidence of stenosis, dissection, or occlusion. Skeleton: No acute osseous  abnormality. Degenerative changes in the cervical spine. Other neck: No acute finding. Upper chest: No focal pulmonary opacity or pleural effusion. Review of the MIP images confirms the above findings CTA HEAD FINDINGS Anterior circulation: Both internal carotid arteries are patent to the termini, without significant stenosis. 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 Normal anterior communicating artery. Anterior cerebral arteries are patent to their distal aspects without significant stenosis. No M1 stenosis or occlusion. MCA branches perfused to their distal aspects without significant stenosis. Posterior circulation: Vertebral arteries patent to the vertebrobasilar junction without significant stenosis. Posterior inferior cerebellar arteries patent proximally. Basilar patent to its distal aspect without significant stenosis. Superior cerebellar arteries patent proximally. Patent P1 segments. Moderate narrowing in the proximal left P2 (series 603, image 119), with an area of larger caliber in the more distal left P 2 (series 603, image 115), which may represent post stenotic dilatation. PCAs otherwise perfused to their distal aspects without significant stenosis. The bilateral posterior communicating arteries are diminutive but patent. Venous sinuses: Well opacified, patent. Anatomic variants: Fenestrated right A1. No evidence of aneurysm or vascular malformation. Review of the MIP images confirms the above findings IMPRESSION: 1. No intracranial large vessel occlusion. Moderate narrowing in the proximal left P2, with an area of larger caliber in the more distal left P2, which may represent post stenotic dilatation. 2. No hemodynamically significant stenosis in the neck. Electronically Signed   By: Wiliam Ke M.D.   On: 06/14/2023 23:44   CT HEAD CODE STROKE WO CONTRAST  Result Date: 06/14/2023 CLINICAL DATA:  Code stroke. Loss of peripheral vision  on the right, intermittent headache EXAM: CT HEAD WITHOUT CONTRAST TECHNIQUE: Contiguous axial images were obtained from the base of the skull through the vertex without intravenous contrast. RADIATION DOSE REDUCTION: This exam was performed according to the departmental dose-optimization program which includes automated exposure control, adjustment of the mA and/or kV according to patient size and/or use of iterative reconstruction technique. COMPARISON:  None Available. FINDINGS: Brain: No evidence of acute infarction, hemorrhage, mass, mass effect, or midline shift. No hydrocephalus or extra-axial collection. Vascular: No hyperdense vessel. Skull: Negative for fracture or focal lesion. Sinuses/Orbits: No acute finding. Other: The mastoid air cells are well aerated. ASPECTS [facility] Stroke Program Early CT Score) - Ganglionic level infarction (caudate, lentiform nuclei, internal capsule, insula, M1-M3 cortex): 7 - Supraganglionic infarction (M4-M6 cortex): 3 Total score (0-10 with 10 being normal): 10 IMPRESSION: No acute intracranial process. ASPECTS is 10. Code stroke imaging results were communicated on 06/14/2023 at 11:29 pm to provider RANCOUR via telephone, who verbally acknowledged these results. Electronically Signed   By: Wiliam Ke M.D.   On: 06/14/2023 23:30    Vitals:   06/15/23 0645 06/15/23 0700 06/15/23 0800 06/15/23 0900  BP: 131/87 124/81 128/81 123/86  Pulse: (!) 104 (!) 105 (!) 101 (!) 107  Resp: 20 10 15 15   Temp:   99.5 F (37.5 C)   TempSrc:   Oral   SpO2: 98% 100% 99% 99%  Weight:      Height:         PHYSICAL EXAM General:  Alert, well-nourished, well-developed patient in no acute distress, parents bedside for support  Psych:  Mood and affect  appropriate for situation, anxious  CV: Tachycardic  and rhythm on monitor Respiratory:  Regular, unlabored respirations on room air  NEURO:  Mental Status: AA&Ox3, patient is able to give clear and coherent  history Speech/Language: speech is without dysarthria or aphasia. Comprehension intact.  Cranial Nerves:  II: PERRL. R sided hemianopsia  III, IV, VI: EOMI. Eyelids elevate symmetrically.  V: Sensation is intact to light touch and symmetrical to face.  VII: Face is symmetrical resting and smiling VIII: hearing intact to voice. IX, X: Palate elevates symmetrically. Phonation is normal.  NW:GNFAOZHY shrug 5/5. XII: tongue is midline without fasciculations. Motor: 5/5 strength to all muscle groups tested.  Tone: is normal and bulk is normal Sensation- Intact to light touch bilaterally. Extinction absent to light touch to DSS.   Coordination: FTN intact bilaterally, HKS: no ataxia in BLE.No drift.  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 CT head No acute abnormality. ASPECTS 10.    Repeat CT tomorrow 24 hours post TNK CTA head & neck moderate narrowing in L P2, may represent post stenotic dilatation  MRI  acute ischemic infarct medial left occipital lobe, left PCA territory. No hemorrhage  2D Echo pending  TCD Bubble w/ small PFO, likely clinically insignificant TEE scheduled tomorrow   Lower ext [place] negative for DVT  Hypercoaguable labs follow-up  LDL 81 HgbA1c 5.4 UDS negative  VTE prophylaxis - Holding  No antithrombotic prior to admission, consider starting ASA 81 mg and Plavix 75 mg daily tomorrow and then ASA 81 mg alone, if repeat CT imaging stable  Therapy recommendations:  Pending Disposition:  Pending   Hypertension Home meds:  None  Stable Permissive Hypertension due to ischemic stroke  Blood Pressure Goal: BP less than 220/110 , prns available   Hyperlipidemia Home meds:  None  LDL 81, goal < 70 Add Crestor 20 mg tomorrow  Continue statin at discharge  Diabetes type II Controlled (None)  Home meds: None  HgbA1c  5.4, goal < 7.0 Recommend close follow-up with PCP  Other risk factors:  Depression (controlled on hormonal birth control) : Home medication: drospirenone-ethinyl estradiol (YAZ,GIANVI,LORYNA) 3-0.02 MG tablet to manage symptoms, discussed increased increase risk for stroke with patient, patient resistant to discontinue, will continue to hold for the acute time period and recommend discussion of alternative with OBGYN provider   Other Active Problems Anxiety: Xanax 0.125 mg TID prn Hypothyroidism: Synthroid 75 mcg daily  Allergies: Fluticasone, hydroxyzine, carbinoxamine   Asthma: albuterol, Symbicort, and Singulair   Hospital day # 0  I have personally obtained history,examined this patient, reviewed notes, independently viewed imaging studies, participated in medical decision making and plan of care.ROS completed by me personally and pertinent positives fully documented  I have made any additions or clarifications directly to the above note. Agree with note above.  Patient with no significant vascular risk factors except being on birth control pills for pre menstrual depression symptoms presenting with sudden onset of vision difficulties due to left PCA branch infarct of cryptogenic etiology.  She received IV TNK but still has significant history of right homonymous hemianopsia.  Recommend continue ongoing stroke workup and strict blood pressure control and neurological checks As per post TNK protocol.  TCD bubble study suggestive of very small PFO will check TEE tomorrow, anticardiolipin antibodies and ANA panel.  Patient counseled to stop birth control pills  but seems reluctant and gets quite emotional and anxious about it.  Long discussion patient and her father and mother at the bedside and answered questions.This patient is critically ill and at significant risk of neurological worsening, death and care requires constant monitoring of vital signs, hemodynamics,respiratory and cardiac monitoring,  extensive review of multiple databases, frequent neurological assessment, discussion with family, other specialists and medical decision making of high complexity.I have made any additions or clarifications directly to the above note.This critical care time does not reflect procedure time, or teaching time or supervisory time of PA/NP/Med Resident etc but could involve care discussion time.  I spent 30 minutes of neurocritical care time  in the care of  this patient.      Delia Heady, MD Medical Director [facility] Pager: [phone] 06/15/2023 5:01 PM   To contact Stroke Continuity provider, please refer to [email or web address]. After hours, contact General Neurology

## 2023-06-15 NOTE — Progress Notes (Signed)
   [facility] has been requested to perform a transesophageal echocardiogram on Christine [name] for CVA.    The patient does NOT have any absolute or relative contraindications to a Transesophageal Echocardiogram (TEE).  The patient has: No other conditions that may impact this procedure.  After careful review of history and examination, the risks and benefits of transesophageal echocardiogram have been explained including risks of esophageal damage, perforation (1:10,000 risk), bleeding, pharyngeal hematoma as well as other potential complications associated with conscious sedation including aspiration, arrhythmia, respiratory failure and death. Alternatives to treatment were discussed, questions were answered. Patient is willing to proceed.   Signed, Laverda Page, NP  06/15/2023 5:08 PM

## 2023-06-16 ENCOUNTER — Inpatient Hospital Stay (HOSPITAL_COMMUNITY): Payer: 59

## 2023-06-16 ENCOUNTER — Other Ambulatory Visit (HOSPITAL_COMMUNITY): Payer: Self-pay

## 2023-06-16 ENCOUNTER — Encounter (HOSPITAL_COMMUNITY)
Admission: EM | Disposition: A | Discharge status: Home / Self Care | Payer: Self-pay | Source: Home / Self Care | Attending: Neurology

## 2023-06-16 DIAGNOSIS — I63332 Cerebral infarction due to thrombosis of left posterior cerebral artery: Secondary | ICD-10-CM

## 2023-06-16 DIAGNOSIS — I639 Cerebral infarction, unspecified: Secondary | ICD-10-CM | POA: Diagnosis not present

## 2023-06-16 DIAGNOSIS — Q2112 Patent foramen ovale: Secondary | ICD-10-CM

## 2023-06-16 HISTORY — PX: TRANSESOPHAGEAL ECHOCARDIOGRAM (CATH LAB): EP1270

## 2023-06-16 LAB — CARDIOLIPIN ANTIBODIES, IGG, IGM, IGA
Anticardiolipin IgA: <9 U/mL (ref 0–11)
Anticardiolipin IgG: <9 [GPL'U]/mL (ref 0–14)
Anticardiolipin IgM: <9 [MPL'U]/mL (ref 0–12)

## 2023-06-16 LAB — ECHO TEE

## 2023-06-16 LAB — ANA W/REFLEX IF POSITIVE: Anti Nuclear Antibody (ANA): NEGATIVE

## 2023-06-16 SURGERY — TRANSESOPHAGEAL ECHOCARDIOGRAM (TEE) (CATHLAB)
Anesthesia: Monitor Anesthesia Care

## 2023-06-16 MED ORDER — ONDANSETRON HCL 4 MG/2ML IJ SOLN
4.0000 mg | Freq: Once | INTRAMUSCULAR | Status: AC
  Administered 2023-06-16: 4 mg via INTRAVENOUS

## 2023-06-16 MED ORDER — ROSUVASTATIN CALCIUM 5 MG PO TABS
10.0000 mg | ORAL_TABLET | Freq: Every day | ORAL | Status: DC
  Administered 2023-06-16: 10 mg via ORAL
  Filled 2023-06-16: qty 2

## 2023-06-16 MED ORDER — ALBUTEROL SULFATE (2.5 MG/3ML) 0.083% IN NEBU
2.5000 mg | INHALATION_SOLUTION | Freq: Once | RESPIRATORY_TRACT | Status: AC
  Administered 2023-06-16: 2.5 mg via RESPIRATORY_TRACT

## 2023-06-16 MED ORDER — ASPIRIN 81 MG PO CHEW
81.0000 mg | CHEWABLE_TABLET | Freq: Every day | ORAL | 1 refills | Status: AC
  Filled 2023-06-16: qty 30, 30d supply, fill #0

## 2023-06-16 MED ORDER — PROPOFOL 10 MG/ML IV BOLUS
INTRAVENOUS | Status: DC | PRN
  Administered 2023-06-16: 50 mg via INTRAVENOUS
  Administered 2023-06-16: 60 mg via INTRAVENOUS
  Administered 2023-06-16 (×2): 50 mg via INTRAVENOUS
  Administered 2023-06-16: 100 mg via INTRAVENOUS
  Administered 2023-06-16 (×2): 50 mg via INTRAVENOUS
  Administered 2023-06-16: 40 mg via INTRAVENOUS
  Administered 2023-06-16: 50 mg via INTRAVENOUS

## 2023-06-16 MED ORDER — ALBUTEROL SULFATE (2.5 MG/3ML) 0.083% IN NEBU
INHALATION_SOLUTION | RESPIRATORY_TRACT | Status: AC
  Filled 2023-06-16: qty 3

## 2023-06-16 MED ORDER — ONDANSETRON HCL 4 MG/2ML IJ SOLN
4.0000 mg | Freq: Once | INTRAMUSCULAR | Status: AC
  Administered 2023-06-16: 4 mg via INTRAVENOUS
  Filled 2023-06-16: qty 2

## 2023-06-16 MED ORDER — ONDANSETRON HCL 4 MG/2ML IJ SOLN
INTRAMUSCULAR | Status: AC
  Filled 2023-06-16: qty 2

## 2023-06-16 MED ORDER — SODIUM CHLORIDE 0.9 % IV SOLN
INTRAVENOUS | Status: DC | PRN
Start: 2023-06-16 — End: 2023-06-16

## 2023-06-16 MED ORDER — CLOPIDOGREL BISULFATE 75 MG PO TABS: 75.0000 mg | ORAL_TABLET | Freq: Every day | ORAL | Status: DC

## 2023-06-16 MED ORDER — ROSUVASTATIN CALCIUM 10 MG PO TABS
10.0000 mg | ORAL_TABLET | Freq: Every day | ORAL | 1 refills | Status: DC
  Filled 2023-06-16: qty 30, 30d supply, fill #0

## 2023-06-16 MED ORDER — ASPIRIN 81 MG PO CHEW
81.0000 mg | CHEWABLE_TABLET | Freq: Every day | ORAL | Status: DC
  Administered 2023-06-16: 81 mg via ORAL
  Filled 2023-06-16: qty 1

## 2023-06-16 NOTE — Evaluation (Signed)
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   Other atopic dermatitis 11/19/2020   Acute sinusitis 09/20/2018   Hypotension and tachycardia 02/01/2018   Tachycardia 02/01/2018   Perennial and seasonal allergic rhinitis 01/18/2018   Not well controlled severe persistent asthma 01/18/2018   Food allergy 01/18/2018   Tension headache 06/05/2014   Migraine without aura and without status migrainosus, not intractable 06/05/2014   Generalized anxiety disorder 06/05/2014   MDD (major depressive disorder), single episode, severe (HCC) 07/04/2013   GAD (generalized anxiety disorder) 07/04/2013   Past Medical History:  Past Medical History:  Diagnosis Date   ADHD    Allergy    Anemia    reports history of slight anemia   Anxiety    Asthma    Coronary artery fistula    by echo per note 12/23/16 at [facility]   Depression    Ehlers-Danlos syndrome    diagnosed x58month ago   GERD (gastroesophageal reflux disease)    H. pylori infection    Heart murmur    Hyperprolactinemia (HCC)    Hypothyroidism    Other atopic dermatitis 11/19/2020   POTS (postural orthostatic tachycardia syndrome)    Urticaria    Vision abnormalities    Past Surgical History:  Past Surgical History:  Procedure Laterality Date   HYMENECTOMY     MOUTH SURGERY     HPI:  Christine [name] isa  22 yo female presenting to ED 11/19 with acute R hemianopia. MRI Brain with acute L occipital lobe and L PCA territory infarcts. PMH includes ADHD, allergy, anemia, anxiety, asthma, coronary artery fistula, depression, Ehlers-Danlos syndrome, GERD, H pylori infection, hypothyroid, POTS, vision abnormalities   Assessment / Plan /  Recommendation Clinical Impression  Pt reports primary concerns related to vision. She states that she lives at home with her parents, where she is typically independent. Performance this date is overall WFL with pt and her father reporting no acute concerns with cognition or speech. Provided education re: f/u if pt notes difficutly with complex or functional tasks upon d/c. No further SLP f/u is warranted at this time. Will s/o.    SLP Assessment  SLP Recommendation/Assessment: Patient does not need any further Speech Lanaguage Pathology Services SLP Visit Diagnosis: Cognitive communication deficit (R41.841)    Recommendations for follow up therapy are one component of a multi-disciplinary discharge planning process, led by the attending physician.  Recommendations may be updated based on patient status, additional functional criteria and insurance authorization.    Follow Up Recommendations  No SLP follow up    Assistance Recommended at Discharge  PRN  Functional Status Assessment Patient has not had a recent decline in their functional status  Frequency and Duration           SLP Evaluation Cognition  Overall Cognitive Status: Within Functional Limits for tasks assessed Orientation Level: Oriented X4       Comprehension  Auditory Comprehension Overall Auditory Comprehension: Appears within functional limits for tasks assessed    Expression Expression Primary Mode of Expression: Verbal Verbal Expression Overall Verbal Expression: Appears within functional limits for tasks assessed   Oral / Motor  Oral Motor/Sensory Function Overall Oral Motor/Sensory Function: Within functional limits Motor Speech Overall Motor Speech: Appears within functional limits  for tasks assessed            Gwynneth Aliment, M.A., CF-SLP Speech Language Pathology, Acute Rehabilitation Services  Secure Chat preferred [phone]  06/16/2023, 11:15 AM

## 2023-06-16 NOTE — Discharge Summary (Cosign Needed)
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 Principal Problem:   Occipital infarction (HCC) Right homonymous hemianopia Hyperlipidimia Premenstrual mood disorder   Allergies as of 06/16/2023       Reactions   Flovent Hfa [fluticasone] Diarrhea   Lactose Nausea And Vomiting   Lactose intolerance   Lactose Intolerance (gi) Other (See Comments)   UNSPECIFIED SEVERITY/REACTION   Other Other (See Comments)   PET DANDER SEASONAL ALLERGIES ASTHMA "TREE NUTS" >> RASH   Pollen Extract Other (See Comments)   UNSPECIFIED REACTION    Coconut (cocos Nucifera) Rash        Medication List     STOP taking these medications    drospirenone-ethinyl estradiol 3-0.02 MG tablet Commonly known as: YAZ   ibuprofen 200 MG tablet Commonly known as: ADVIL       TAKE these medications    albuterol 108 (90 Base) MCG/ACT inhaler Commonly known as: VENTOLIN HFA USE 2 INHALATIONS BY MOUTH  INTO THE LUNGS EVERY 4  HOURS AS NEEDED FOR  WHEEZING What changed:  how much to take how to take this when to take this reasons to take this additional instructions   ALPRAZolam 0.25 MG tablet Commonly known as: XANAX Take 0.125 mg by mouth 3 (three) times daily as needed (for anxiety/panic attacks.).   aspirin 81 MG chewable tablet Chew 1 tablet (81 mg total) by mouth daily.   budesonide-formoterol 160-4.5 MCG/ACT inhaler Commonly known as: Symbicort USE 2 INHALATIONS BY MOUTH TWICE DAILY with spacer and rinse mouth afterwards. What changed:  how much to take how to take this when to take this additional  instructions   Carbinoxamine Maleate 4 MG Tabs Take 1 tablet (4 mg total) by mouth every 6 (six) hours as needed. What changed: reasons to take this   fluticasone 50 MCG/ACT nasal spray Commonly known as: FLONASE Place 1 spray into both nostrils 2 (two) times daily as needed for allergies or rhinitis. What changed: when to take this   hydrOXYzine 25 MG tablet Commonly known as: ATARAX Take 37.5 mg by mouth daily.   Hyoscyamine Sulfate SL 0.125 MG Subl Take 0.125 mg by mouth 3 (three) times daily as needed.   levothyroxine 75 MCG tablet Commonly known as: SYNTHROID Take 75 mcg by mouth daily.   montelukast 10 MG tablet Commonly known as: SINGULAIR Take 1 tablet (10 mg total) by mouth at bedtime. What changed: when to take this   ondansetron 4 MG tablet Commonly known as: ZOFRAN Take 4 mg by mouth as needed for nausea or vomiting.   pantoprazole 40 MG tablet Commonly known as: PROTONIX Take 40 mg by mouth daily.   rosuvastatin 10 MG tablet Commonly known as: CRESTOR Take 1 tablet (10 mg total) by mouth daily.   Vitamin D (Ergocalciferol) 1.25 MG (50000 UNIT) Caps capsule Commonly known as: DRISDOL Take 50,000 Units by mouth 2 (two) times a week.        LABORATORY STUDIES CBC    Component Value Date/Time   WBC 8.4 06/14/2023 2316   RBC 3.89 06/14/2023 2316   HGB 8.8 (L) 06/14/2023  2316   HCT 28.7 (L) 06/14/2023 2316   PLT 398 06/14/2023 2316   MCV 73.8 (L) 06/14/2023 2316   MCH 22.6 (L) 06/14/2023 2316   MCHC 30.7 06/14/2023 2316   RDW 17.4 (H) 06/14/2023 2316   LYMPHSABS 2.5 06/14/2023 2316   MONOABS 0.7 06/14/2023 2316   EOSABS 0.1 06/14/2023 2316   BASOSABS 0.1 06/14/2023 2316   CMP    Component Value Date/Time   NA 132 (L) 06/14/2023 2316   K 4.1 06/14/2023 2316   CL 107 06/14/2023 2316   CO2 18 (L) 06/14/2023 2316   GLUCOSE 93 06/14/2023 2316   BUN 15 06/14/2023 2316   CREATININE 0.78 06/14/2023 2316   CALCIUM 8.8 (L) 06/14/2023 2316    PROT 7.2 06/14/2023 2316   ALBUMIN 3.4 (L) 06/14/2023 2316   AST 19 06/14/2023 2316   ALT 10 06/14/2023 2316   ALKPHOS 79 06/14/2023 2316   BILITOT 0.4 06/14/2023 2316   GFRNONAA >60 06/14/2023 2316   GFRAA >60 07/02/2017 1515   COAGS Lab Results  Component Value Date   INR 1.0 06/14/2023   Lipid Panel    Component Value Date/Time   CHOL 186 06/15/2023 0730   TRIG 147 06/15/2023 0730   HDL 76 06/15/2023 0730   CHOLHDL 2.4 06/15/2023 0730   VLDL 29 06/15/2023 0730   LDLCALC 81 06/15/2023 0730   HgbA1C  Lab Results  Component Value Date   HGBA1C 5.4 06/15/2023   Urinalysis    Component Value Date/Time   COLORURINE YELLOW 06/14/2023 2352   APPEARANCEUR CLEAR 06/14/2023 2352   LABSPEC 1.010 06/14/2023 2352   PHURINE 7.0 06/14/2023 2352   GLUCOSEU NEGATIVE 06/14/2023 2352   HGBUR TRACE (A) 06/14/2023 2352   BILIRUBINUR NEGATIVE 06/14/2023 2352   KETONESUR NEGATIVE 06/14/2023 2352   PROTEINUR NEGATIVE 06/14/2023 2352   UROBILINOGEN 0.2 07/06/2013 2108   NITRITE NEGATIVE 06/14/2023 2352   LEUKOCYTESUR NEGATIVE 06/14/2023 2352   Urine Drug Screen     Component Value Date/Time   LABOPIA NONE DETECTED 06/14/2023 2352   COCAINSCRNUR NONE DETECTED 06/14/2023 2352   LABBENZ NONE DETECTED 06/14/2023 2352   AMPHETMU NONE DETECTED 06/14/2023 2352   THCU NONE DETECTED 06/14/2023 2352   LABBARB NONE DETECTED 06/14/2023 2352    Alcohol Level    Component Value Date/Time   ETH <10 06/14/2023 2316     SIGNIFICANT DIAGNOSTIC STUDIES EP STUDY  Result Date: 06/16/2023 See surgical note for result.  CT HEAD WO CONTRAST  Result Date: 06/16/2023 CLINICAL DATA:  Stroke follow-up after tn KA EXAM: CT HEAD WITHOUT CONTRAST TECHNIQUE: Contiguous axial images were obtained from the base of the skull through the vertex without intravenous contrast. RADIATION DOSE REDUCTION: This exam was performed according to the departmental dose-optimization program which includes automated  exposure control, adjustment of the mA and/or kV according to patient size and/or use of iterative reconstruction technique. COMPARISON:  06/14/2023 CT head, 06/15/2023 MRI head FINDINGS: Brain: Hypodensity in the medial left occipital lobe, which correlates with the acute infarct noted on the 06/15/2023 MRI. No evidence of acute hemorrhage. No new acute infarct. No mass, mass effect, or midline shift. No hydrocephalus or extra-axial collection. Vascular: No hyperdense vessel. Skull: Negative for fracture or focal lesion. Sinuses/Orbits: Clear paranasal sinuses. No acute finding in the orbits. Other: The mastoids are well aerated. IMPRESSION: Hypodensity in the medial left occipital lobe, which correlates with the acute infarct noted on the 06/15/2023 MRI. No evidence of hemorrhagic transformation or additional infarct. Electronically  Signed   By: Wiliam Ke M.D.   On: 06/16/2023 03:34   VAS [place] LOWER EXTREMITY VENOUS (DVT)  Result Date: 06/15/2023  Lower Venous DVT Study Patient Name:  DENASHIA DICKE  Date of Exam:   06/15/2023 Medical Rec #: [id number]               Accession #:    [id number] Date of Birth: Jan 21, 2001               Patient Gender: F Patient Age:   22 years Exam Location:  [facility] Procedure:      VAS [place] LOWER EXTREMITY VENOUS (DVT) Referring Phys: Dewitt Hoes DE LA TORRE --------------------------------------------------------------------------------  Indications: Stroke.  Limitations: Continuous patient movement. Comparison Study: No previous exams Performing Technologist: Jody Hill RVT, RDMS  Examination Guidelines: A complete evaluation includes B-mode imaging, spectral Doppler, color Doppler, and power Doppler as needed of all accessible portions of each vessel. Bilateral testing is considered an integral part of a complete examination. Limited examinations for reoccurring indications may be performed as noted. The reflux portion of the exam is performed with the patient in  reverse Trendelenburg.  +---------+---------------+---------+-----------+----------+--------------+ RIGHT    CompressibilityPhasicitySpontaneityPropertiesThrombus Aging +---------+---------------+---------+-----------+----------+--------------+ CFV      Full           Yes      Yes                                 +---------+---------------+---------+-----------+----------+--------------+ SFJ      Full                                                        +---------+---------------+---------+-----------+----------+--------------+ FV Prox  Full           Yes      Yes                                 +---------+---------------+---------+-----------+----------+--------------+ FV Mid   Full           Yes      Yes                                 +---------+---------------+---------+-----------+----------+--------------+ FV DistalFull           Yes      Yes                                 +---------+---------------+---------+-----------+----------+--------------+ PFV      Full                                                        +---------+---------------+---------+-----------+----------+--------------+ POP      Full           Yes      Yes                                 +---------+---------------+---------+-----------+----------+--------------+  PTV      Full                                                        +---------+---------------+---------+-----------+----------+--------------+ PERO     Full                                                        +---------+---------------+---------+-----------+----------+--------------+   +---------+---------------+---------+-----------+----------+--------------+ LEFT     CompressibilityPhasicitySpontaneityPropertiesThrombus Aging +---------+---------------+---------+-----------+----------+--------------+ CFV      Full           Yes      Yes                                  +---------+---------------+---------+-----------+----------+--------------+ SFJ      Full                                                        +---------+---------------+---------+-----------+----------+--------------+ FV Prox  Full           Yes      Yes                                 +---------+---------------+---------+-----------+----------+--------------+ FV Mid   Full           Yes      Yes                                 +---------+---------------+---------+-----------+----------+--------------+ FV DistalFull           Yes      Yes                                 +---------+---------------+---------+-----------+----------+--------------+ PFV      Full                                                        +---------+---------------+---------+-----------+----------+--------------+ POP      Full           Yes      Yes                                 +---------+---------------+---------+-----------+----------+--------------+ PTV      Full                                                        +---------+---------------+---------+-----------+----------+--------------+  PERO     Full                                                        +---------+---------------+---------+-----------+----------+--------------+     Summary: BILATERAL: - No evidence of deep vein thrombosis seen in the lower extremities, bilaterally. -No evidence of popliteal cyst, bilaterally.   *See table(s) above for measurements and observations. Electronically signed by Coral Else MD on 06/15/2023 at 7:40:08 PM.    Final    ECHOCARDIOGRAM COMPLETE  Result Date: 06/15/2023    ECHOCARDIOGRAM REPORT   Patient Name:   HILDE CASHMORE Date of Exam: 06/15/2023 Medical Rec #:  [id number]              Height:       65.0 in Accession #:    [id number]             Weight:       128.4 lb Date of Birth:  2001-02-08              BSA:          1.639 m Patient Age:    22 years                BP:           116/91 mmHg Patient Gender: F                      HR:           110 bpm. Exam Location:  Inpatient Procedure: 2D Echo, Cardiac Doppler and Color Doppler Indications:    I63.9 Stroke  History:        Patient has no prior history of Echocardiogram examinations.                 Stroke; Risk Factors:Non-Smoker.  Sonographer:    Dondra Prader RVT RCS Referring Phys: [phone] MCNEILL P [facility]  Sonographer Comments: Image acquisition challenging due to uncooperative patient and Image acquisition challenging due to respiratory motion. Technically challenging due to patient not lying still throughout ECHO. IMPRESSIONS  1. Left ventricular ejection fraction, by estimation, is 60 to 65%. The left ventricle has normal function. The left ventricle has no regional wall motion abnormalities. Left ventricular diastolic function could not be evaluated.  2. Right ventricular systolic function is normal. The right ventricular size is normal. Tricuspid regurgitation signal is inadequate for assessing PA pressure.  3. The mitral valve is normal in structure. Trivial mitral valve regurgitation. No evidence of mitral stenosis.  4. The aortic valve is normal in structure. Aortic valve regurgitation is not visualized. No aortic stenosis is present.  5. The inferior vena cava is normal in size with greater than 50% respiratory variability, suggesting right atrial pressure of 3 mmHg. Conclusion(s)/Recommendation(s): No intracardiac source of embolism detected on this transthoracic study. Consider a transesophageal echocardiogram to exclude cardiac source of embolism if clinically indicated. FINDINGS  Left Ventricle: Left ventricular ejection fraction, by estimation, is 60 to 65%. The left ventricle has normal function. The left ventricle has no regional wall motion abnormalities. The left ventricular internal cavity size was normal in size. There is  no left ventricular hypertrophy. Left ventricular diastolic function could  not be evaluated. Right Ventricle: The right ventricular size is normal. No increase  in right ventricular wall thickness. Right ventricular systolic function is normal. Tricuspid regurgitation signal is inadequate for assessing PA pressure. Left Atrium: Left atrial size was normal in size. Right Atrium: Right atrial size was normal in size. Pericardium: There is no evidence of pericardial effusion. Mitral Valve: The mitral valve is normal in structure. Trivial mitral valve regurgitation. No evidence of mitral valve stenosis. Tricuspid Valve: The tricuspid valve is normal in structure. Tricuspid valve regurgitation is not demonstrated. No evidence of tricuspid stenosis. Aortic Valve: The aortic valve is normal in structure. Aortic valve regurgitation is not visualized. No aortic stenosis is present. Aortic valve mean gradient measures 3.0 mmHg. Aortic valve peak gradient measures 5.0 mmHg. Aortic valve area, by VTI measures 2.34 cm. Pulmonic Valve: The pulmonic valve was normal in structure. Pulmonic valve regurgitation is not visualized. No evidence of pulmonic stenosis. Aorta: The aortic root is normal in size and structure. Venous: The inferior vena cava is normal in size with greater than 50% respiratory variability, suggesting right atrial pressure of 3 mmHg. IAS/Shunts: No atrial level shunt detected by color flow Doppler.  LEFT VENTRICLE PLAX 2D LVIDd:         4.30 cm LVIDs:         3.00 cm LV PW:         0.70 cm LV IVS:        0.70 cm LVOT diam:     2.00 cm LV SV:         38 LV SV Index:   23 LVOT Area:     3.14 cm  RIGHT VENTRICLE             IVC RV S prime:     12.30 cm/s  IVC diam: 1.20 cm TAPSE (M-mode): 2.1 cm LEFT ATRIUM           Index       RIGHT ATRIUM          Index LA diam:      2.50 cm 1.53 cm/m  RA Area:     8.91 cm LA Vol (A2C): 13.4 ml 8.18 ml/m  RA Volume:   18.30 ml 11.17 ml/m LA Vol (A4C): 12.7 ml 7.75 ml/m  AORTIC VALVE                    PULMONIC VALVE AV Area (Vmax):    2.27 cm      PV Vmax:       0.82 m/s AV Area (Vmean):   2.23 cm     PV Peak grad:  2.7 mmHg AV Area (VTI):     2.34 cm AV Vmax:           112.00 cm/s AV Vmean:          78.600 cm/s AV VTI:            0.161 m AV Peak Grad:      5.0 mmHg AV Mean Grad:      3.0 mmHg LVOT Vmax:         80.90 cm/s LVOT Vmean:        55.800 cm/s LVOT VTI:          0.120 m LVOT/AV VTI ratio: 0.75  AORTA Ao Root diam: 2.70 cm Ao Asc diam:  2.50 cm MITRAL VALVE MV Area (PHT): 6.60 cm    SHUNTS MV Decel Time: 115 msec    Systemic VTI:  0.12 m MV E velocity: 73.10 cm/s  Systemic Diam:  2.00 cm MV A velocity: 85.40 cm/s MV E/A ratio:  0.86 Armanda Magic MD Electronically signed by Armanda Magic MD Signature Date/Time: 06/15/2023/7:24:44 PM    Final    VAS [place] TRANSCRANIAL DOPPLER W BUBBLES  Result Date: 06/15/2023  Transcranial Doppler with Bubble Patient Name:  ASHLYNE RIDPATH  Date of Exam:   06/15/2023 Medical Rec #: [id number]               Accession #:    [id number] Date of Birth: 08-09-2000               Patient Gender: F Patient Age:   47 years Exam Location:  [facility] Procedure:      VAS [place] TRANSCRANIAL DOPPLER W BUBBLES Referring Phys: Dewitt Hoes DE LA TORRE --------------------------------------------------------------------------------  Indications: Stroke. Comparison Study: No previous exams Performing Technologist: Jody Hill RVT, RDMS  Examination Guidelines: A complete evaluation includes B-mode imaging, spectral Doppler, color Doppler, and power Doppler as needed of all accessible portions of each vessel. Bilateral testing is considered an integral part of a complete examination. Limited examinations for reoccurring indications may be performed as noted.  Summary:  A vascular evaluation was performed. The left middle cerebral artery was studied. An IV was inserted into the patient's right AC. Verbal informed consent was obtained.  No HITS heard at rest. Less than 10 HITS heard with valsalva indicating a Spencer Grade 1 PFO.  *See table(s) above for TCD measurements and observations.    Preliminary    MR BRAIN WO CONTRAST  Result Date: 06/15/2023 CLINICAL DATA:  Stroke, follow-up. EXAM: MRI HEAD WITHOUT CONTRAST TECHNIQUE: Limited stroke protocol MRI of the brain and surrounding structures was obtained without intravenous contrast. Patient declined to continue with the examination after axial T2 FLAIR images were obtained. COMPARISON:  Head CT and CTA head/neck 06/14/2023. FINDINGS: Brain: Acute infarct in the medial left occipital lobe, left PCA territory. No acute hemorrhage or significant mass effect. No hydrocephalus or extra-axial collection. Vascular: Normal flow voids. Skull and upper cervical spine: Normal marrow signal. Sinuses/Orbits: Unremarkable. Other: None. IMPRESSION: Acute infarct in the medial left occipital lobe, left PCA territory. No acute hemorrhage or significant mass effect. Electronically Signed   By: Orvan Falconer M.D.   On: 06/15/2023 13:22   CT ANGIO HEAD NECK W WO CM (CODE STROKE)  Result Date: 06/14/2023 CLINICAL DATA:  Loss of vision EXAM: CT ANGIOGRAPHY HEAD AND NECK WITH AND WITHOUT CONTRAST TECHNIQUE: Multidetector CT imaging of the head and neck was performed using the standard protocol during bolus administration of intravenous contrast. Multiplanar CT image reconstructions and MIPs were obtained to evaluate the vascular anatomy. Carotid stenosis measurements (when applicable) are obtained utilizing NASCET criteria, using the distal internal carotid diameter as the denominator. RADIATION DOSE REDUCTION: This exam was performed according to the departmental dose-optimization program which includes automated exposure control, adjustment of the mA and/or kV according to patient size and/or use of iterative reconstruction technique. CONTRAST:  75mL OMNIPAQUE IOHEXOL 350 MG/ML SOLN COMPARISON:  No prior CTA available, correlation is made with same day CT head FINDINGS: CT HEAD FINDINGS For  noncontrast findings, please see same day CT head. CTA NECK FINDINGS Aortic arch: Standard branching. Imaged portion shows no evidence of aneurysm or dissection. No significant stenosis of the major arch vessel origins. Right carotid system: No evidence of stenosis, dissection, or occlusion. Left carotid system: No evidence of stenosis, dissection, or occlusion. Vertebral arteries: No evidence of stenosis, dissection, or occlusion. Skeleton: No  acute osseous abnormality. Degenerative changes in the cervical spine. Other neck: No acute finding. Upper chest: No focal pulmonary opacity or pleural effusion. Review of the MIP images confirms the above findings CTA HEAD FINDINGS Anterior circulation: Both internal carotid arteries are patent to the termini, without significant stenosis. 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 Normal anterior communicating artery. Anterior cerebral arteries are patent to their distal aspects without significant stenosis. No M1 stenosis or occlusion. MCA branches perfused to their distal aspects without significant stenosis. Posterior circulation: Vertebral arteries patent to the vertebrobasilar junction without significant stenosis. Posterior inferior cerebellar arteries patent proximally. Basilar patent to its distal aspect without significant stenosis. Superior cerebellar arteries patent proximally. Patent P1 segments. Moderate narrowing in the proximal left P2 (series 603, image 119), with an area of larger caliber in the more distal left P 2 (series 603, image 115), which may represent post stenotic dilatation. PCAs otherwise perfused to their distal aspects without significant stenosis. The bilateral posterior communicating arteries are diminutive but patent. Venous sinuses: Well opacified, patent. Anatomic variants: Fenestrated right A1. No evidence of aneurysm or vascular malformation. Review of the MIP images confirms the  above findings IMPRESSION: 1. No intracranial large vessel occlusion. Moderate narrowing in the proximal left P2, with an area of larger caliber in the more distal left P2, which may represent post stenotic dilatation. 2. No hemodynamically significant stenosis in the neck. Electronically Signed   By: Wiliam Ke M.D.   On: 06/14/2023 23:44   CT HEAD CODE STROKE WO CONTRAST  Result Date: 06/14/2023 CLINICAL DATA:  Code stroke. Loss of peripheral vision on the right, intermittent headache EXAM: CT HEAD WITHOUT CONTRAST TECHNIQUE: Contiguous axial images were obtained from the base of the skull through the vertex without intravenous contrast. RADIATION DOSE REDUCTION: This exam was performed according to the departmental dose-optimization program which includes automated exposure control, adjustment of the mA and/or kV according to patient size and/or use of iterative reconstruction technique. COMPARISON:  None Available. FINDINGS: Brain: No evidence of acute infarction, hemorrhage, mass, mass effect, or midline shift. No hydrocephalus or extra-axial collection. Vascular: No hyperdense vessel. Skull: Negative for fracture or focal lesion. Sinuses/Orbits: No acute finding. Other: The mastoid air cells are well aerated. ASPECTS [facility] Stroke Program Early CT Score) - Ganglionic level infarction (caudate, lentiform nuclei, internal capsule, insula, M1-M3 cortex): 7 - Supraganglionic infarction (M4-M6 cortex): 3 Total score (0-10 with 10 being normal): 10 IMPRESSION: No acute intracranial process. ASPECTS is 10. Code stroke imaging results were communicated on 06/14/2023 at 11:29 pm to provider RANCOUR via telephone, who verbally acknowledged these results. Electronically Signed   By: Wiliam Ke M.D.   On: 06/14/2023 23:30      HISTORY OF PRESENT ILLNESS Jaire Becherer is a 22 y.o. female past medical history of ADHD,Depression, Anxiety, Asthma, Coronary artery fistula, Anemia, Ehlers-Danlos  syndrome, GERD, H. pylori infection, Heart murmur, Hyperprolactinemia, Hypothyroidism, POTS (postural orthostatic tachycardia syndrome), urticaria and allergies. She presented with acute onset right hemianopia that occurred when she was texting with a friend Monday night. MRI showed an acute ischemic and parked in the left medial occipital lobe.  She had a TEE done on 11/20 that dilated show a small PFO, however this was discussed with the structural heart team and they do not feel that it is significant.  We will start her on aspirin 81 mg which should be continued after discharge.  We will  defer starting Plavix due to her menstrual bleeding and she will follow-up with [facility] in 8 weeks.  She has an appointment with her gynecologist tomorrow to discuss changing her birth control as she is high risk for a recurrent stroke.  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 CT head No acute abnormality. ASPECTS 10.    Repeat CT c/w prior CT. 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 No hemorrhage  2D Echo EF 60-65%, unremarkable, no intracardiac embolism  TCD Bubble w/ small PFO, likely clinically insignificant TEE - small PFO, discussed with Dr. Excell Seltzer,  Lower ext [place] negative for DVT  Follow-up anticardiolipin labs outpatient  ANA w/ reflex unremarkable LDL 81 HgbA1c 5.4 UDS negative  VTE prophylaxis - SCDS  No antithrombotic prior to admission, start ASA 81 mg and continue at discharge  Deferred starting Plavix due to menstrual bleeding Follow-up with [facility] in 8 weeks in stroke clinic Therapy recommendations:  No follow-up  Disposition: Home    Hypertension Home meds:  None  Stable    Hyperlipidemia Home meds:  None  LDL 81, goal < 70 Start Crestor 10 mg daily  Continue statin at discharge   Diabetes type II Controlled (None)  Home meds: None  HgbA1c 5.4, goal < 7.0 Recommend close follow-up with PCP   Other risk factors:  Depression (controlled on hormonal birth control) : Home medication: drospirenone-ethinyl estradiol (YAZ,GIANVI,LORYNA) 3-0.02 MG tablet to manage symptoms, discussed increased increase risk for stroke with patient, patient resistant to discontinue, will continue to hold for the acute time period and recommend discussion of alternative with OBGYN provider    Other Active Problems Anxiety: Xanax 0.125 mg TID prn Hypothyroidism: Synthroid 75 mcg daily  Allergies: Fluticasone, hydroxyzine, carbinoxamine   Asthma: albuterol, Symbicort, and Singulair    DISCHARGE EXAM Blood pressure 116/74, pulse (!) 103, temperature 97.8 F (36.6 C), temperature source Temporal, resp. rate 18, height 5\' 5"  (1.651 m), weight 58.2 kg, SpO2 100%.   General:  Alert, well-nourished, well-developed patient in no acute distress, parents bedside for support  Psych:  Mood and affect appropriate for situation, anxious  CV: Tachycardic  and rhythm on monitor Respiratory:  Regular, unlabored respirations on room air   NEURO:  Mental Status: AA&Ox3, patient is able to give clear and coherent history Speech/Language: speech is without dysarthria or aphasia. Comprehension intact.  Cranial Nerves:  II: PERRL. R sided hemianopsia  III, IV, VI: EOMI. Eyelids elevate symmetrically.  V: Sensation is intact to light touch and symmetrical to face.  VII: Face is symmetrical resting and smiling VIII: hearing intact to voice. IX, X: Palate elevates symmetrically. Phonation is normal.  YQ:MVHQIONG shrug 5/5. XII: tongue is midline without fasciculations. Motor: 5/5 strength to all muscle groups tested.  Tone: is normal and bulk is normal Sensation- Intact to light touch  bilaterally. Extinction absent to light touch to DSS.   Coordination: FTN intact bilaterally, HKS: no ataxia in BLE.No drift.  Gait- deferred   Most Recent NIH 3  Discharge Diet       There are no active orders of the following types: Diet, Nourishments.   liquids  DISCHARGE PLAN Disposition:  Home No antithrombotic for secondary stroke prevention for with ASA 81 mg  and continue at discharge, deferred starting plavix 75 mg due to menstrual bleeding Ongoing stroke risk factor control by Primary Care Physician at time of discharge Follow-up PCP Dois Davenport, MD in 2 weeks. Follow-up in [facility] in 8 weeks, office to schedule an appointment.  STOP birth control pill and keep appt with gynaecologist tomorrow to discuss alternative medicines  35 minutes were spent preparing discharge.  Patient seen and examined by NP/APP with MD. MD to update note as needed.   Elmer Picker, DNP, FNP-BC [facility] Pager: [phone]  I have personally obtained history,examined this patient, reviewed notes, independently viewed imaging studies, participated in medical decision making and plan of care.ROS completed by me personally and pertinent positives fully documented  I have made any additions or clarifications directly to the above note. Agree with note above.    Delia Heady, MD Medical Director [facility] Pager: [phone] 06/17/2023 8:27 AM

## 2023-06-16 NOTE — CV Procedure (Signed)
TRANSESOPHAGEAL ECHOCARDIOGRAM (TEE) NOTE  INDICATIONS: Cryptogenic stroke  PROCEDURE:   Informed consent was obtained prior to the procedure. The risks, benefits and alternatives for the procedure were discussed and the patient comprehended these risks.  Risks include, but are not limited to, cough, sore throat, vomiting, nausea, somnolence, esophageal and stomach trauma or perforation, bleeding, low blood pressure, aspiration, pneumonia, infection, trauma to the teeth and death.    After a procedural time-out, the patient was given propofol for sedation by anesthesia. See their separate report.  The patient's heart rate, blood pressure, and oxygen saturation are monitored continuously during the procedure.The oropharynx was anesthetized with topical cetacaine.  The transesophageal probe was inserted in the esophagus and stomach without difficulty and multiple views were obtained.  The patient was kept under observation until the patient left the procedure room.  I was present face-to-face 100% of this time. The patient left the procedure room in stable condition.   Agitated microbubble saline contrast was administered.  COMPLICATIONS:    There were no immediate complications.  Findings:  LEFT VENTRICLE: The left ventricular wall thickness is normal.  The left ventricular cavity is normal in size. Wall motion is normal.  LVEF is 60-65%.  RIGHT VENTRICLE:  The right ventricle is normal in structure and function without any thrombus or masses.    LEFT ATRIUM:  The left atrium is normal in size without any thrombus or masses.  There is not spontaneous echo contrast ("smoke") in the left atrium consistent with a low flow state.  LEFT ATRIAL APPENDAGE:  The left atrial appendage is free of any thrombus or masses. The appendage has single lobes. Pulse doppler indicates moderate flow in the appendage.  ATRIAL SEPTUM:  The atrial septum is aneurysmal, but difficult to image in its entirety.  2 bubble studies were performed. There was evidence for right to left shunting, likely at the atrial level consistent with small PFO. The timing of shunt was difficult to establish as she was in sinus tachycardia into the 140's during the procedure. No color doppler evidence of shunt.  RIGHT ATRIUM:  The right atrium is normal in size and function without any thrombus or masses.  MITRAL VALVE:  The mitral valve is normal in structure and function with  no  regurgitation.  There were no vegetations or stenosis.  AORTIC VALVE:  The aortic valve is trileaflet, normal in structure and function with  no  regurgitation.  There were no vegetations or stenosis  TRICUSPID VALVE:  The tricuspid valve is normal in structure and function with  trivial  regurgitation.  There were no vegetations or stenosis   PULMONIC VALVE:  The pulmonic valve is normal in structure and function with  no  regurgitation.  There were no vegetations or stenosis.   AORTIC ARCH, ASCENDING AND DESCENDING AORTA:  The aortic root is normal, the ascending aorta and arch were not well visualized.  12. PULMONARY VEINS: Anomalous pulmonary venous return was not completely evaluated.  13. PERICARDIUM: The pericardium appeared normal and non-thickened.  There is no pericardial effusion.  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  No LAA thrombus Otherwise normal echo  RECOMMENDATIONS:    Consider limited repeat surface echo with bubble study  when HR is better controlled to further evaluate the atrial septum for possible closure. Given stroke and TCD findings of shunt, suspect PFO is the likely source.  Time Spent Directly with the Patient:  45 minutes    Chrystie Nose, MD, [facility], FACP  [place]  [facility]  Medical Director of the [facility]  [facility] Clinic Diplomate of the American Board of Clinical Lipidology Attending Cardiologist  Direct Dial: [phone]  Fax: [phone]  Website:  [email or web address] Nicely Christine [name] 06/16/2023, 1:42 PM

## 2023-06-16 NOTE — Progress Notes (Addendum)
At approximately 0525, pt complained of nausea and had "spit up" before RN entered the room. Neuro MD paged for IV Zofran as only PO was ordered and pt was NPO. Neuro exam remained the same during and after this episode.

## 2023-06-16 NOTE — Transfer of Care (Signed)
Immediate Anesthesia Transfer of Care Note  Patient: Christine [name]  Procedure(s) Performed: TRANSESOPHAGEAL ECHOCARDIOGRAM  Patient Location: PACU  Anesthesia Type:MAC  Level of Consciousness: sedated  Airway & Oxygen Therapy: Patient Spontanous Breathing and Patient connected to nasal cannula oxygen  Post-op Assessment: Report given to RN and Post -op Vital signs reviewed and stable  Post vital signs: Reviewed and stable  Last Vitals:  Vitals Value Taken Time  BP    Temp    Pulse 109 06/16/23 1346  Resp 19 06/16/23 1346  SpO2 99 % 06/16/23 1346  Vitals shown include unfiled device data.  Last Pain:  Vitals:   06/16/23 1202  TempSrc: Temporal  PainSc:       Patients Stated Pain Goal: 0 (06/16/23 0850)  Complications: No notable events documented.

## 2023-06-16 NOTE — Progress Notes (Addendum)
STROKE TEAM PROGRESS NOTE   BRIEF HPI Christine [name] is a 22 y.o. female past medical history of ADHD,Depression, Anxiety, Asthma, Coronary artery fistula, Anemia, Ehlers-Danlos syndrome, GERD, H. pylori infection, Heart murmur, Hyperprolactinemia, Hypothyroidism, POTS (postural orthostatic tachycardia syndrome), urticaria and allergies. She presented with acute onset right hemianopia that occurred when she was texting with a friend Monday night.    Last known well: 2200 11/18 Modified rankin score: 0 IV Thrombolysis: Yes  Thrombectomy: no, no LVO  NIH on Admission 3  SIGNIFICANT HOSPITAL EVENTS CTA moderate narrowing in L P2, may represent post stenotic dilatation  TNK given 06/14/2023 23:58:00    INTERIM HISTORY/SUBJECTIVE Right sided visual deficits and decreased sensation similar. Discussed recommendation to discontinue estrogen OCPs and discussing alternative with gynecologist. Patient has appointment with GYN tomorrow. Patient with menstrual cramps currently, normally takes advil and CBD oil to manage symptoms. Encouraged to take extra strength tylenol, instead of advil. Extra strength tylenol hasn't worked in the past, and would only take advil 200 mg TID prn at max if needed.   OBJECTIVE  CBC    Component Value Date/Time   WBC 8.4 06/14/2023 2316   RBC 3.89 06/14/2023 2316   HGB 8.8 (L) 06/14/2023 2316   HCT 28.7 (L) 06/14/2023 2316   PLT 398 06/14/2023 2316   MCV 73.8 (L) 06/14/2023 2316   MCH 22.6 (L) 06/14/2023 2316   MCHC 30.7 06/14/2023 2316   RDW 17.4 (H) 06/14/2023 2316   LYMPHSABS 2.5 06/14/2023 2316   MONOABS 0.7 06/14/2023 2316   EOSABS 0.1 06/14/2023 2316   BASOSABS 0.1 06/14/2023 2316    BMET    Component Value Date/Time   NA 132 (L) 06/14/2023 2316   K 4.1 06/14/2023 2316   CL 107 06/14/2023 2316   CO2 18 (L) 06/14/2023 2316   GLUCOSE 93 06/14/2023 2316   BUN 15 06/14/2023 2316   CREATININE 0.78 06/14/2023 2316   CALCIUM 8.8 (L) 06/14/2023  2316   GFRNONAA >60 06/14/2023 2316    IMAGING past 24 hours CT HEAD WO CONTRAST  Result Date: 06/16/2023 CLINICAL DATA:  Stroke follow-up after tn KA EXAM: CT HEAD WITHOUT CONTRAST TECHNIQUE: Contiguous axial images were obtained from the base of the skull through the vertex without intravenous contrast. RADIATION DOSE REDUCTION: This exam was performed according to the departmental dose-optimization program which includes automated exposure control, adjustment of the mA and/or kV according to patient size and/or use of iterative reconstruction technique. COMPARISON:  06/14/2023 CT head, 06/15/2023 MRI head FINDINGS: Brain: Hypodensity in the medial left occipital lobe, which correlates with the acute infarct noted on the 06/15/2023 MRI. No evidence of acute hemorrhage. No new acute infarct. No mass, mass effect, or midline shift. No hydrocephalus or extra-axial collection. Vascular: No hyperdense vessel. Skull: Negative for fracture or focal lesion. Sinuses/Orbits: Clear paranasal sinuses. No acute finding in the orbits. Other: The mastoids are well aerated. IMPRESSION: Hypodensity in the medial left occipital lobe, which correlates with the acute infarct noted on the 06/15/2023 MRI. No evidence of hemorrhagic transformation or additional infarct. Electronically Signed   By: Wiliam Ke M.D.   On: 06/16/2023 03:34   VAS [place] LOWER EXTREMITY VENOUS (DVT)  Result Date: 06/15/2023  Lower Venous DVT Study Patient Name:  Christine [name]  Date of Exam:   06/15/2023 Medical Rec #: [id number]               Accession #:    [id number] Date of Birth: 2000-10-03  Patient Gender: F Patient Age:   35 years Exam Location:  [facility] Procedure:      VAS [place] LOWER EXTREMITY VENOUS (DVT) Referring Phys: Dewitt Hoes DE LA TORRE --------------------------------------------------------------------------------  Indications: Stroke.  Limitations: Continuous patient movement. Comparison Study: No  previous exams Performing Technologist: Jody Hill RVT, RDMS  Examination Guidelines: A complete evaluation includes B-mode imaging, spectral Doppler, color Doppler, and power Doppler as needed of all accessible portions of each vessel. Bilateral testing is considered an integral part of a complete examination. Limited examinations for reoccurring indications may be performed as noted. The reflux portion of the exam is performed with the patient in reverse Trendelenburg.  +---------+---------------+---------+-----------+----------+--------------+ RIGHT    CompressibilityPhasicitySpontaneityPropertiesThrombus Aging +---------+---------------+---------+-----------+----------+--------------+ CFV      Full           Yes      Yes                                 +---------+---------------+---------+-----------+----------+--------------+ SFJ      Full                                                        +---------+---------------+---------+-----------+----------+--------------+ FV Prox  Full           Yes      Yes                                 +---------+---------------+---------+-----------+----------+--------------+ FV Mid   Full           Yes      Yes                                 +---------+---------------+---------+-----------+----------+--------------+ FV DistalFull           Yes      Yes                                 +---------+---------------+---------+-----------+----------+--------------+ PFV      Full                                                        +---------+---------------+---------+-----------+----------+--------------+ POP      Full           Yes      Yes                                 +---------+---------------+---------+-----------+----------+--------------+ PTV      Full                                                        +---------+---------------+---------+-----------+----------+--------------+ PERO     Full                                                         +---------+---------------+---------+-----------+----------+--------------+   +---------+---------------+---------+-----------+----------+--------------+  LEFT     CompressibilityPhasicitySpontaneityPropertiesThrombus Aging +---------+---------------+---------+-----------+----------+--------------+ CFV      Full           Yes      Yes                                 +---------+---------------+---------+-----------+----------+--------------+ SFJ      Full                                                        +---------+---------------+---------+-----------+----------+--------------+ FV Prox  Full           Yes      Yes                                 +---------+---------------+---------+-----------+----------+--------------+ FV Mid   Full           Yes      Yes                                 +---------+---------------+---------+-----------+----------+--------------+ FV DistalFull           Yes      Yes                                 +---------+---------------+---------+-----------+----------+--------------+ PFV      Full                                                        +---------+---------------+---------+-----------+----------+--------------+ POP      Full           Yes      Yes                                 +---------+---------------+---------+-----------+----------+--------------+ PTV      Full                                                        +---------+---------------+---------+-----------+----------+--------------+ PERO     Full                                                        +---------+---------------+---------+-----------+----------+--------------+     Summary: BILATERAL: - No evidence of deep vein thrombosis seen in the lower extremities, bilaterally. -No evidence of popliteal cyst, bilaterally.   *See table(s) above for measurements and observations. Electronically signed  by Coral Else MD on 06/15/2023 at 7:40:08 PM.    Final    ECHOCARDIOGRAM COMPLETE  Result Date: 06/15/2023    ECHOCARDIOGRAM REPORT  Patient Name:   Christine [name] Date of Exam: 06/15/2023 Medical Rec #:  [id number]              Height:       65.0 in Accession #:    [id number]             Weight:       128.4 lb Date of Birth:  [birth year]              BSA:          1.639 m Patient Age:    22 years               BP:           116/91 mmHg Patient Gender: F                      HR:           110 bpm. Exam Location:  Inpatient Procedure: 2D Echo, Cardiac Doppler and Color Doppler Indications:    I63.9 Stroke  History:        Patient has no prior history of Echocardiogram examinations.                 Stroke; Risk Factors:Non-Smoker.  Sonographer:    Dondra Prader RVT RCS Referring Phys: [phone] [facility]  Sonographer Comments: Image acquisition challenging due to uncooperative patient and Image acquisition challenging due to respiratory motion. Technically challenging due to patient not lying still throughout ECHO. IMPRESSIONS  1. Left ventricular ejection fraction, by estimation, is 60 to 65%. The left ventricle has normal function. The left ventricle has no regional wall motion abnormalities. Left ventricular diastolic function could not be evaluated.  2. Right ventricular systolic function is normal. The right ventricular size is normal. Tricuspid regurgitation signal is inadequate for assessing PA pressure.  3. The mitral valve is normal in structure. Trivial mitral valve regurgitation. No evidence of mitral stenosis.  4. The aortic valve is normal in structure. Aortic valve regurgitation is not visualized. No aortic stenosis is present.  5. The inferior vena cava is normal in size with greater than 50% respiratory variability, suggesting right atrial pressure of 3 mmHg. Conclusion(s)/Recommendation(s): No intracardiac source of embolism detected on this transthoracic study. Consider a  transesophageal echocardiogram to exclude cardiac source of embolism if clinically indicated. FINDINGS  Left Ventricle: Left ventricular ejection fraction, by estimation, is 60 to 65%. The left ventricle has normal function. The left ventricle has no regional wall motion abnormalities. The left ventricular internal cavity size was normal in size. There is  no left ventricular hypertrophy. Left ventricular diastolic function could not be evaluated. Right Ventricle: The right ventricular size is normal. No increase in right ventricular wall thickness. Right ventricular systolic function is normal. Tricuspid regurgitation signal is inadequate for assessing PA pressure. Left Atrium: Left atrial size was normal in size. Right Atrium: Right atrial size was normal in size. Pericardium: There is no evidence of pericardial effusion. Mitral Valve: The mitral valve is normal in structure. Trivial mitral valve regurgitation. No evidence of mitral valve stenosis. Tricuspid Valve: The tricuspid valve is normal in structure. Tricuspid valve regurgitation is not demonstrated. No evidence of tricuspid stenosis. Aortic Valve: The aortic valve is normal in structure. Aortic valve regurgitation is not visualized. No aortic stenosis is present. Aortic valve mean gradient measures 3.0 mmHg. Aortic valve peak gradient measures 5.0 mmHg. Aortic valve area, by VTI measures 2.34 cm. Pulmonic  Valve: The pulmonic valve was normal in structure. Pulmonic valve regurgitation is not visualized. No evidence of pulmonic stenosis. Aorta: The aortic root is normal in size and structure. Venous: The inferior vena cava is normal in size with greater than 50% respiratory variability, suggesting right atrial pressure of 3 mmHg. IAS/Shunts: No atrial level shunt detected by color flow Doppler.  LEFT VENTRICLE PLAX 2D LVIDd:         4.30 cm LVIDs:         3.00 cm LV PW:         0.70 cm LV IVS:        0.70 cm LVOT diam:     2.00 cm LV SV:         38 LV SV  Index:   23 LVOT Area:     3.14 cm  RIGHT VENTRICLE             IVC RV S prime:     12.30 cm/s  IVC diam: 1.20 cm TAPSE (M-mode): 2.1 cm LEFT ATRIUM           Index       RIGHT ATRIUM          Index LA diam:      2.50 cm 1.53 cm/m  RA Area:     8.91 cm LA Vol (A2C): 13.4 ml 8.18 ml/m  RA Volume:   18.30 ml 11.17 ml/m LA Vol (A4C): 12.7 ml 7.75 ml/m  AORTIC VALVE                    PULMONIC VALVE AV Area (Vmax):    2.27 cm     PV Vmax:       0.82 m/s AV Area (Vmean):   2.23 cm     PV Peak grad:  2.7 mmHg AV Area (VTI):     2.34 cm AV Vmax:           112.00 cm/s AV Vmean:          78.600 cm/s AV VTI:            0.161 m AV Peak Grad:      5.0 mmHg AV Mean Grad:      3.0 mmHg LVOT Vmax:         80.90 cm/s LVOT Vmean:        55.800 cm/s LVOT VTI:          0.120 m LVOT/AV VTI ratio: 0.75  AORTA Ao Root diam: 2.70 cm Ao Asc diam:  2.50 cm MITRAL VALVE MV Area (PHT): 6.60 cm    SHUNTS MV Decel Time: 115 msec    Systemic VTI:  0.12 m MV E velocity: 73.10 cm/s  Systemic Diam: 2.00 cm MV A velocity: 85.40 cm/s MV E/A ratio:  0.86 Armanda Magic MD Electronically signed by Armanda Magic MD Signature Date/Time: 06/15/2023/7:24:44 PM    Final    VAS [place] TRANSCRANIAL DOPPLER W BUBBLES  Result Date: 06/15/2023  Transcranial Doppler with Bubble Patient Name:  Christine [name]  Date of Exam:   06/15/2023 Medical Rec #: [id number]               Accession #:    [id number] Date of Birth: 11/27/2000               Patient Gender: F Patient Age:   86 years Exam Location:  [facility] Procedure:      VAS [place] TRANSCRANIAL DOPPLER W  BUBBLES Referring Phys: Dewitt Hoes DE LA TORRE --------------------------------------------------------------------------------  Indications: Stroke. Comparison Study: No previous exams Performing Technologist: Jody Hill RVT, RDMS  Examination Guidelines: A complete evaluation includes B-mode imaging, spectral Doppler, color Doppler, and power Doppler as needed of all accessible portions of each  vessel. Bilateral testing is considered an integral part of a complete examination. Limited examinations for reoccurring indications may be performed as noted.  Summary:  A vascular evaluation was performed. The left middle cerebral artery was studied. An IV was inserted into the patient's right AC. Verbal informed consent was obtained.  No HITS heard at rest. Less than 10 HITS heard with valsalva indicating a Spencer Grade 1 PFO. *See table(s) above for TCD measurements and observations.    Preliminary    MR BRAIN WO CONTRAST  Result Date: 06/15/2023 CLINICAL DATA:  Stroke, follow-up. EXAM: MRI HEAD WITHOUT CONTRAST TECHNIQUE: Limited stroke protocol MRI of the brain and surrounding structures was obtained without intravenous contrast. Patient declined to continue with the examination after axial T2 FLAIR images were obtained. COMPARISON:  Head CT and CTA head/neck 06/14/2023. FINDINGS: Brain: Acute infarct in the medial left occipital lobe, left PCA territory. No acute hemorrhage or significant mass effect. No hydrocephalus or extra-axial collection. Vascular: Normal flow voids. Skull and upper cervical spine: Normal marrow signal. Sinuses/Orbits: Unremarkable. Other: None. IMPRESSION: Acute infarct in the medial left occipital lobe, left PCA territory. No acute hemorrhage or significant mass effect. Electronically Signed   By: Orvan Falconer M.D.   On: 06/15/2023 13:22    Vitals:   06/16/23 0400 06/16/23 0500 06/16/23 0600 06/16/23 0700  BP: 111/74 104/77 101/74 114/77  Pulse:   95 82  Resp: 19 18 14  (!) 23  Temp: 99.2 F (37.3 C)     TempSrc: Oral     SpO2:  99% 100% 93%  Weight:      Height:         PHYSICAL EXAM General:  Alert, well-nourished, well-developed patient in no acute distress, parents bedside for support  Psych:  Mood and affect appropriate for situation, anxious  CV: Tachycardic  and rhythm on monitor Respiratory:  Regular, unlabored respirations on room air  NEURO:   Mental Status: AA&Ox3, patient is able to give clear and coherent history Speech/Language: speech is without dysarthria or aphasia. Comprehension intact.  Cranial Nerves:  II: PERRL. R sided dense homonymous hemianopsia  III, IV, VI: EOMI. Eyelids elevate symmetrically.  V: Sensation is intact to light touch and symmetrical to face.  VII: Face is symmetrical resting and smiling VIII: hearing intact to voice. IX, X: Palate elevates symmetrically. Phonation is normal.  MW:UXLKGMWN shrug 5/5. XII: tongue is midline without fasciculations. Motor: 5/5 strength to all muscle groups tested.  Tone: is normal and bulk is normal Sensation- Intact to light touch bilaterally. Extinction absent to light touch to DSS.   Coordination: FTN intact bilaterally, HKS: no ataxia in BLE.No drift.  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 CT head No acute abnormality. ASPECTS 10.    Repeat CT c/w prior CT. 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 No hemorrhage  2D Echo EF 60-65%, unremarkable, no intracardiac embolism  TCD Bubble w/ small  PFO, likely clinically insignificant TEE today  Lower ext [place] negative for DVT  Follow-up anticardiolipin labs outpatient  ANA w/ reflex unremarkable LDL 81 HgbA1c 5.4 UDS negative  VTE prophylaxis - SCDS  No antithrombotic prior to admission, start ASA 81 mg and continue at discharge  Deferred starting Plavix due to menstrual cycle  Follow-up with [facility] in 8 weeks in stroke clinic Therapy recommendations:  No follow-up  Disposition: Home   Hypertension Home meds:  None  Stable  Hyperlipidemia Home meds:  None  LDL 81, goal < 70 Start Crestor 10 mg  daily  Continue statin at discharge  Diabetes type II Controlled (None)  Home meds: None  HgbA1c 5.4, goal < 7.0 Recommend close follow-up with PCP  Other risk factors:  Depression (controlled on hormonal birth control) : Home medication: drospirenone-ethinyl estradiol (YAZ,GIANVI,LORYNA) 3-0.02 MG tablet to manage symptoms, discussed increased increase risk for stroke with patient, patient resistant to discontinue, will continue to hold for the acute time period and recommend discussion of alternative with OBGYN provider   Other Active Problems Anxiety: Xanax 0.125 mg TID prn Hypothyroidism: Synthroid 75 mcg daily  Allergies: Fluticasone, hydroxyzine, carbinoxamine   Asthma: albuterol, Symbicort, and Singulair   Hospital day # 2  I have personally obtained history,examined this patient, reviewed notes, independently viewed imaging studies, participated in medical decision making and plan of care.ROS completed by me personally and pertinent positives fully documented  I have made any additions or clarifications directly to the above note. Agree with note above.  Patient continues to have right-sided peripheral vision loss.  Plans for TEE later today.  Likely discharge home after TEE on aspirin alone and will not add Plavix due to menstrual bleeding.  Patient encouraged to take Tylenol Extra Strength instead of ibuprofen for her premenstrual pain.  Follow-up with her gynecologist tomorrow to discuss alternative options to estrogen-containing birth control pills to treat her premenstrual mood disorder.  Long discussion patient and her mother and father at the bedside and answered questions.  Greater than 50% time during this 50-minute visit was spent on counseling and coordination of care about her cryptogenic stroke question about stroke evaluation, prevention and treatment and answering questions.  Delia Heady, MD Medical Director [facility] Pager: [phone] 06/16/2023 2:07  PM   To contact Stroke Continuity provider, please refer to [email or web address]. After hours, contact General Neurology

## 2023-06-16 NOTE — Progress Notes (Signed)
Pt d/c to home by car with family. Assessment stable. D/C instructions reviewed and all questions answered. Medications picked up from [facility] pharmacy and given to pt.

## 2023-06-16 NOTE — Anesthesia Postprocedure Evaluation (Signed)
Anesthesia Post Note  Patient: Christine [name]  Procedure(s) Performed: TRANSESOPHAGEAL ECHOCARDIOGRAM     Patient location during evaluation: Cath Lab Anesthesia Type: MAC Level of consciousness: awake and alert Pain management: pain level controlled Vital Signs Assessment: post-procedure vital signs reviewed and stable Respiratory status: spontaneous breathing, nonlabored ventilation and respiratory function stable Cardiovascular status: stable and blood pressure returned to baseline Postop Assessment: no apparent nausea or vomiting Anesthetic complications: no  No notable events documented.  Last Vitals:  Vitals:   06/16/23 1355 06/16/23 1400  BP: (!) 113/57 116/74  Pulse: (!) 107 (!) 103  Resp: 17 18  Temp:  36.6 C  SpO2: 100% 100%    Last Pain:  Vitals:   06/16/23 1400  TempSrc: Temporal  PainSc: 0-No pain                 Jasim Harari,W. EDMOND

## 2023-06-16 NOTE — Progress Notes (Signed)
PT Cancellation Note  Patient Details Name: Christine [name] MRN: [id number] DOB: 13-Sep-2000   Cancelled Treatment:    Reason Eval/Treat Not Completed: Patient at procedure or test/unavailable; down for TEE.  Will follow up.   Elray Mcgregor 06/16/2023, 1:14 PM Sheran Lawless, PT Acute Rehabilitation Services Office:[phone] 06/16/2023

## 2023-06-16 NOTE — Anesthesia Preprocedure Evaluation (Addendum)
Anesthesia Evaluation  Patient identified by MRN, date of birth, ID band Patient awake    Reviewed: Allergy & Precautions, H&P , NPO status , Patient's Chart, lab work & pertinent test results  Airway Mallampati: II  TM Distance: >3 FB Neck ROM: Full    Dental no notable dental hx. (+) Teeth Intact, Dental Advisory Given   Pulmonary asthma    Pulmonary exam normal breath sounds clear to auscultation       Cardiovascular negative cardio ROS  Rhythm:Regular Rate:Normal     Neuro/Psych  Headaches  Anxiety Depression    CVA, Residual Symptoms    GI/Hepatic Neg liver ROS,GERD  Medicated,,  Endo/Other  Hypothyroidism    Renal/GU negative Renal ROS  negative genitourinary   Musculoskeletal   Abdominal   Peds  Hematology  (+) Blood dyscrasia, anemia   Anesthesia Other Findings   Reproductive/Obstetrics negative OB ROS                             Anesthesia Physical Anesthesia Plan  ASA: 3  Anesthesia Plan: MAC   Post-op Pain Management: Minimal or no pain anticipated   Induction: Intravenous  PONV Risk Score and Plan: 2 and Propofol infusion and Treatment may vary due to age or medical condition  Airway Management Planned: Natural Airway and Nasal Cannula  Additional Equipment:   Intra-op Plan:   Post-operative Plan:   Informed Consent: I have reviewed the patients History and Physical, chart, labs and discussed the procedure including the risks, benefits and alternatives for the proposed anesthesia with the patient or authorized representative who has indicated his/her understanding and acceptance.     Dental advisory given  Plan Discussed with: CRNA  Anesthesia Plan Comments:        Anesthesia Quick Evaluation

## 2023-06-16 NOTE — Progress Notes (Signed)
  Echocardiogram Echocardiogram Transesophageal has been performed.  Christine [name] 06/16/2023, 1:59 PM

## 2023-06-16 NOTE — Interval H&P Note (Signed)
History and Physical Interval Note:  06/16/2023 1:19 PM  Christine [name]  has presented today for surgery, with the diagnosis of CVA.  The various methods of treatment have been discussed with the patient and family. After consideration of risks, benefits and other options for treatment, the patient has consented to  Procedure(s): TRANSESOPHAGEAL ECHOCARDIOGRAM (N/A) as a surgical intervention.  The patient's history has been reviewed, patient examined, no change in status, stable for surgery.  I have reviewed the patient's chart and labs.  Questions were answered to the patient's satisfaction.     Chrystie Nose

## 2023-06-17 ENCOUNTER — Encounter (HOSPITAL_COMMUNITY): Payer: Self-pay | Admitting: Internal Medicine

## 2023-06-17 DIAGNOSIS — I639 Cerebral infarction, unspecified: Secondary | ICD-10-CM | POA: Insufficient documentation

## 2023-07-08 ENCOUNTER — Ambulatory Visit: Payer: 59 | Admitting: Cardiology

## 2023-07-26 ENCOUNTER — Inpatient Hospital Stay (HOSPITAL_BASED_OUTPATIENT_CLINIC_OR_DEPARTMENT_OTHER): Payer: 59 | Admitting: Hematology & Oncology

## 2023-07-26 ENCOUNTER — Encounter: Payer: Self-pay | Admitting: Hematology & Oncology

## 2023-07-26 ENCOUNTER — Other Ambulatory Visit: Payer: Self-pay

## 2023-07-26 ENCOUNTER — Inpatient Hospital Stay: Payer: 59 | Attending: Hematology & Oncology

## 2023-07-26 VITALS — BP 108/63 | HR 103 | Temp 99.0°F | Resp 16 | Ht 65.0 in | Wt 124.0 lb

## 2023-07-26 DIAGNOSIS — D500 Iron deficiency anemia secondary to blood loss (chronic): Secondary | ICD-10-CM | POA: Insufficient documentation

## 2023-07-26 DIAGNOSIS — N920 Excessive and frequent menstruation with regular cycle: Secondary | ICD-10-CM | POA: Diagnosis present

## 2023-07-26 DIAGNOSIS — E063 Autoimmune thyroiditis: Secondary | ICD-10-CM | POA: Insufficient documentation

## 2023-07-26 DIAGNOSIS — R768 Other specified abnormal immunological findings in serum: Secondary | ICD-10-CM | POA: Insufficient documentation

## 2023-07-26 DIAGNOSIS — Z8673 Personal history of transient ischemic attack (TIA), and cerebral infarction without residual deficits: Secondary | ICD-10-CM

## 2023-07-26 DIAGNOSIS — N921 Excessive and frequent menstruation with irregular cycle: Secondary | ICD-10-CM

## 2023-07-26 DIAGNOSIS — E559 Vitamin D deficiency, unspecified: Secondary | ICD-10-CM | POA: Insufficient documentation

## 2023-07-26 LAB — CBC WITH DIFFERENTIAL (CANCER CENTER ONLY)
Abs Immature Granulocytes: 0.03 10*3/uL (ref 0.00–0.07)
Basophils Absolute: 0.1 10*3/uL (ref 0.0–0.1)
Basophils Relative: 1 %
Eosinophils Absolute: 0.1 10*3/uL (ref 0.0–0.5)
Eosinophils Relative: 1 %
HCT: 26.8 % — ABNORMAL LOW (ref 36.0–46.0)
Hemoglobin: 8.4 g/dL — ABNORMAL LOW (ref 12.0–15.0)
Immature Granulocytes: 0 %
Lymphocytes Relative: 26 %
Lymphs Abs: 2.4 10*3/uL (ref 0.7–4.0)
MCH: 23.3 pg — ABNORMAL LOW (ref 26.0–34.0)
MCHC: 31.3 g/dL (ref 30.0–36.0)
MCV: 74.2 fL — ABNORMAL LOW (ref 80.0–100.0)
Monocytes Absolute: 0.7 10*3/uL (ref 0.1–1.0)
Monocytes Relative: 8 %
Neutro Abs: 6.1 10*3/uL (ref 1.7–7.7)
Neutrophils Relative %: 64 %
Platelet Count: 429 10*3/uL — ABNORMAL HIGH (ref 150–400)
RBC: 3.61 MIL/uL — ABNORMAL LOW (ref 3.87–5.11)
RDW: 17.3 % — ABNORMAL HIGH (ref 11.5–15.5)
WBC Count: 9.4 10*3/uL (ref 4.0–10.5)
nRBC: 0 % (ref 0.0–0.2)

## 2023-07-26 LAB — RETICULOCYTES
Immature Retic Fract: 14.8 % (ref 2.3–15.9)
RBC.: 3.65 MIL/uL — ABNORMAL LOW (ref 3.87–5.11)
Retic Count, Absolute: 35 10*3/uL (ref 19.0–186.0)
Retic Ct Pct: 1 % (ref 0.4–3.1)

## 2023-07-26 LAB — FERRITIN: Ferritin: 5 ng/mL — ABNORMAL LOW (ref 11–307)

## 2023-07-26 LAB — SAVE SMEAR(SSMR), FOR PROVIDER SLIDE REVIEW

## 2023-07-26 NOTE — Progress Notes (Signed)
Referral MD  Reason for Referral: Iron deficiency anemia-menorrhagia  Chief Complaint  Patient presents with   New Patient (Initial Visit)  : My iron is low.  HPI: Christine [name] is a very nice 22 year old white female.  She comes in with her mom.  She is adopted.  She actually had a stroke back in November.  She actually lost vision overall her right side.  She had little bit of weakness on the right leg.  She has a small PFO.  She is on baby aspirin right now.  She has had problems with anemia.  She has had iron deficiency.  She cannot take oral iron.  It makes her throw up.  She does have heavy cycles.  She does see her gynecologist.  She has had no bleeding otherwise.  She has had problems with severe abdominal pain.  She has been seen by Gastroenterology.  She has had multiple endoscopic procedures.  She has had no cough.  There is been no problems with COVID.  She does not smoke.  She does not drink.   Back in November, her white cell count is 8.4.  Hemoglobin 8.8.  Platelet count 398,000.  Her MCV was 74.  Going back to 2017, her white cell count is 12.1.  Hemoglobin 9.8.  Platelet count 497,000.  MCV was 85.  She has had no past surgeries..  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  She is not a vegetarian.  She does have to watch what she eats.  I think there has been no problems with weight loss.  Currently, I would have said that her performance status is probably ECOG 1.   Past Medical History:  Diagnosis Date   ADHD    Allergy    Anemia    reports history of slight anemia   Anxiety    Asthma    Coronary artery fistula    by echo per note 12/23/16 at [facility]   Depression    Ehlers-Danlos syndrome    diagnosed x36month ago   GERD (gastroesophageal reflux disease)    H. pylori infection    Heart murmur    Hyperprolactinemia (HCC)    Hypothyroidism    Other atopic dermatitis 11/19/2020   POTS (postural orthostatic tachycardia syndrome)     Urticaria    Vision abnormalities   :   Past Surgical History:  Procedure Laterality Date   HYMENECTOMY     MOUTH SURGERY     TRANSESOPHAGEAL ECHOCARDIOGRAM (CATH LAB) N/A 06/16/2023   Procedure: TRANSESOPHAGEAL ECHOCARDIOGRAM;  Surgeon: Chrystie Nose, MD;  Location: [facility];  Service: Cardiovascular;  Laterality: N/A;  :   Current Outpatient Medications:    albuterol (VENTOLIN HFA) 108 (90 Base) MCG/ACT inhaler, USE 2 INHALATIONS BY MOUTH  INTO THE LUNGS EVERY 4  HOURS AS NEEDED FOR  WHEEZING (Patient taking differently: Inhale 2 puffs into the lungs as needed for wheezing or shortness of breath.), Disp: 18 g, Rfl: 0   ALPRAZolam (XANAX) 0.25 MG tablet, Take 0.125 mg by mouth 3 (three) times daily as needed (for anxiety/panic attacks.). , Disp: , Rfl:    aspirin 81 MG chewable tablet, Chew 1 tablet (81 mg total) by mouth daily., Disp: 30 tablet, Rfl: 1   budesonide-formoterol (SYMBICORT) 160-4.5 MCG/ACT inhaler, USE 2 INHALATIONS BY MOUTH TWICE DAILY with spacer and rinse mouth afterwards. (Patient taking differently: Inhale 2 puffs into the lungs in the morning and at bedtime.), Disp: 30.6 g, Rfl: 2   Carbinoxamine  Maleate 4 MG TABS, Take 1 tablet (4 mg total) by mouth every 6 (six) hours as needed. (Patient taking differently: Take 4 mg by mouth every 6 (six) hours as needed (allergies).), Disp: 270 tablet, Rfl: 2   fluticasone (FLONASE) 50 MCG/ACT nasal spray, Place 1 spray into both nostrils 2 (two) times daily as needed for allergies or rhinitis. (Patient taking differently: Place 1 spray into both nostrils daily.), Disp: 48 g, Rfl: 2   hydrOXYzine (ATARAX/VISTARIL) 25 MG tablet, Take 37.5 mg by mouth daily., Disp: , Rfl:    Hyoscyamine Sulfate SL 0.125 MG SUBL, Take 0.125 mg by mouth 3 (three) times daily as needed., Disp: , Rfl:    levothyroxine (SYNTHROID) 75 MCG tablet, Take 75 mcg by mouth daily., Disp: , Rfl:    montelukast (SINGULAIR) 10 MG tablet, Take 1 tablet (10  mg total) by mouth at bedtime. (Patient taking differently: Take 10 mg by mouth daily.), Disp: 90 tablet, Rfl: 1   ondansetron (ZOFRAN) 4 MG tablet, Take 4 mg by mouth as needed for nausea or vomiting., Disp: , Rfl:    pantoprazole (PROTONIX) 40 MG tablet, Take 40 mg by mouth daily., Disp: , Rfl:    rosuvastatin (CRESTOR) 10 MG tablet, Take 1 tablet (10 mg total) by mouth daily., Disp: 30 tablet, Rfl: 1   Vitamin D, Ergocalciferol, (DRISDOL) 1.25 MG (50000 UNIT) CAPS capsule, Take 50,000 Units by mouth 2 (two) times a week. (Patient not taking: Reported on 06/15/2023), Disp: , Rfl: :  :   Allergies  Allergen Reactions   Escitalopram     Other Reaction(s): Eye Dialations   Flovent Hfa [Fluticasone] Diarrhea   Lactose Nausea And Vomiting    Lactose intolerance   Lactose Intolerance (Gi) Other (See Comments)    UNSPECIFIED SEVERITY/REACTION   Other Other (See Comments)    PET DANDER SEASONAL ALLERGIES ASTHMA "TREE NUTS" >> RASH   Pollen Extract Other (See Comments)    UNSPECIFIED REACTION    Sertraline     Other Reaction(s): Increase Depression   Coconut (Cocos Nucifera) Rash  :   Family History  Adopted: Yes  Problem Relation Age of Onset   Bipolar disorder Maternal Uncle    Allergic rhinitis Neg Hx    Asthma Neg Hx    Eczema Neg Hx    Urticaria Neg Hx   :   Social History   Socioeconomic History   Marital status: Single    Spouse name: Not on file   Number of children: Not on file   Years of education: Not on file   Highest education level: Not on file  Occupational History   Not on file  Tobacco Use   Smoking status: Never   Smokeless tobacco: Never  Vaping Use   Vaping status: Never Used  Substance and Sexual Activity   Alcohol use: No    Alcohol/week: 0.0 standard drinks of alcohol   Drug use: No   Sexual activity: Never    Birth control/protection: Abstinence    Comment: insurance questions declined  Other Topics Concern   Not on file  Social  History Narrative   Not on file   Social Drivers of Health   Financial Resource Strain: Not on file  Food Insecurity: Not on file  Transportation Needs: Not on file  Physical Activity: Not on file  Stress: Not on file  Social Connections: Not on file  Intimate Partner Violence: Not on file  : Review of Systems  Constitutional:  Positive for  malaise/fatigue.  HENT: Negative.    Eyes:  Positive for blurred vision.  Respiratory: Negative.    Cardiovascular: Negative.   Gastrointestinal:  Positive for abdominal pain.  Genitourinary: Negative.   Musculoskeletal: Negative.   Skin: Negative.   Neurological: Negative.   Endo/Heme/Allergies: Negative.   Psychiatric/Behavioral: Negative.       Exam: Vital signs show temperature of 99.  Pulse 103.  Blood pressure 108/63.  Weight is 124 pounds.  @IPVITALS @ Physical Exam Vitals reviewed.  HENT:     Head: Normocephalic and atraumatic.  Eyes:     Pupils: Pupils are equal, round, and reactive to light.  Cardiovascular:     Rate and Rhythm: Normal rate and regular rhythm.     Heart sounds: Normal heart sounds.  Pulmonary:     Effort: Pulmonary effort is normal.     Breath sounds: Normal breath sounds.  Abdominal:     General: Bowel sounds are normal.     Palpations: Abdomen is soft.  Musculoskeletal:        General: No tenderness or deformity. Normal range of motion.     Cervical back: Normal range of motion.  Lymphadenopathy:     Cervical: No cervical adenopathy.  Skin:    General: Skin is warm and dry.     Findings: No erythema or rash.  Neurological:     Mental Status: She is alert and oriented to person, place, and time.  Psychiatric:        Behavior: Behavior normal.        Thought Content: Thought content normal.        Judgment: Judgment normal.     Recent Labs    07/26/23 1336  WBC 9.4  HGB 8.4*  HCT 26.8*  PLT 429*   No results for input(s): "NA", "K", "CL", "CO2", "GLUCOSE", "BUN", "CREATININE",  "CALCIUM" in the last 72 hours.  Blood smear review: Some microcytic and hypochromic red blood cells.  I do not see any spherocytes or schistocytes.  There are no nucleated red blood cells.  She has no rouleaux formation.  She has no teardrop cells.  There is no target cells.  White blood cells appear normal in morphology and maturation.  She has no hypersegmented polys.  There is no immature myeloid or lymphoid cells.  Platelets are adequate in number and size.  Pathology: None    Assessment and Plan: [name] is a very nice 22 year old white female.  She has iron deficiency anemia.  Everything points iron deficiency anemia.  She has very low MCV.  She has a high platelet count.  Her reticulocyte count, when corrected is less than 1.  Her blood smear certainly is consistent with iron deficiency.  We will have to see what her iron levels are.  This will then dictate how much iron that she will need.  We will have to see what her insurance company will cover for IV iron.  We will subsequently plan to get her back to see [place].  Once we give her IV iron, we will probably get her back in about a month afterwards.  She and her mom are both very nice.  I really enjoyed talking with both of them.

## 2023-07-27 ENCOUNTER — Encounter: Payer: Self-pay | Admitting: Hematology & Oncology

## 2023-07-27 ENCOUNTER — Telehealth: Payer: Self-pay

## 2023-07-27 DIAGNOSIS — D500 Iron deficiency anemia secondary to blood loss (chronic): Secondary | ICD-10-CM

## 2023-07-27 DIAGNOSIS — N920 Excessive and frequent menstruation with regular cycle: Secondary | ICD-10-CM | POA: Insufficient documentation

## 2023-07-27 HISTORY — DX: Excessive and frequent menstruation with regular cycle: N92.0

## 2023-07-27 HISTORY — DX: Iron deficiency anemia secondary to blood loss (chronic): D50.0

## 2023-07-27 LAB — IRON AND IRON BINDING CAPACITY (CC-WL,HP ONLY)
Iron: 33 ug/dL (ref 28–170)
Saturation Ratios: 6 % — ABNORMAL LOW (ref 10.4–31.8)
TIBC: 602 ug/dL — ABNORMAL HIGH (ref 250–450)
UIBC: 569 ug/dL — ABNORMAL HIGH (ref 148–442)

## 2023-07-27 LAB — ERYTHROPOIETIN: Erythropoietin: 51.7 m[IU]/mL — ABNORMAL HIGH (ref 2.6–18.5)

## 2023-07-27 NOTE — Telephone Encounter (Signed)
Advised via MyChart.

## 2023-07-27 NOTE — Telephone Encounter (Signed)
-----   Message from Josph Macho sent at 07/27/2023 12:24 PM EST ----- Call and let him know that the iron is incredibly low as expected.

## 2023-08-02 ENCOUNTER — Telehealth: Payer: Self-pay | Admitting: Hematology & Oncology

## 2023-08-02 NOTE — Telephone Encounter (Signed)
 Called to schedule follow up appt & iron infusions. Pt aware of date/time.

## 2023-08-13 ENCOUNTER — Inpatient Hospital Stay: Payer: 59 | Attending: Hematology & Oncology

## 2023-08-13 VITALS — BP 98/62 | HR 91 | Temp 98.0°F | Resp 17

## 2023-08-13 DIAGNOSIS — R110 Nausea: Secondary | ICD-10-CM

## 2023-08-13 DIAGNOSIS — L509 Urticaria, unspecified: Secondary | ICD-10-CM

## 2023-08-13 DIAGNOSIS — D500 Iron deficiency anemia secondary to blood loss (chronic): Secondary | ICD-10-CM | POA: Insufficient documentation

## 2023-08-13 DIAGNOSIS — N920 Excessive and frequent menstruation with regular cycle: Secondary | ICD-10-CM | POA: Diagnosis present

## 2023-08-13 DIAGNOSIS — N921 Excessive and frequent menstruation with irregular cycle: Secondary | ICD-10-CM

## 2023-08-13 MED ORDER — ACETAMINOPHEN 325 MG PO TABS: 650.0000 mg | ORAL_TABLET | Freq: Once | ORAL | Status: DC

## 2023-08-13 MED ORDER — DIPHENHYDRAMINE HCL 50 MG/ML IJ SOLN
50.0000 mg | Freq: Once | INTRAMUSCULAR | Status: AC | PRN
  Administered 2023-08-13: 25 mg via INTRAVENOUS

## 2023-08-13 MED ORDER — LORATADINE 10 MG PO TABS
10.0000 mg | ORAL_TABLET | Freq: Once | ORAL | Status: DC
  Filled 2023-08-13: qty 1

## 2023-08-13 MED ORDER — METHYLPREDNISOLONE SODIUM SUCC 125 MG IJ SOLR
125.0000 mg | Freq: Once | INTRAMUSCULAR | Status: AC | PRN
  Administered 2023-08-13: 60 mg via INTRAVENOUS

## 2023-08-13 MED ORDER — ONDANSETRON HCL 4 MG/2ML IJ SOLN
8.0000 mg | Freq: Once | INTRAMUSCULAR | Status: AC
  Administered 2023-08-13: 8 mg via INTRAVENOUS

## 2023-08-13 MED ORDER — SODIUM CHLORIDE 0.9 % IV SOLN: Freq: Once | INTRAVENOUS | Status: DC | PRN

## 2023-08-13 MED ORDER — NA FERRIC GLUC CPLX IN SUCROSE 12.5 MG/ML IV SOLN
250.0000 mg | INTRAVENOUS | Status: DC
  Administered 2023-08-13: 250 mg via INTRAVENOUS
  Filled 2023-08-13: qty 20

## 2023-08-13 MED ORDER — SODIUM CHLORIDE 0.9 % IV SOLN: INTRAVENOUS | Status: DC

## 2023-08-13 MED ORDER — FAMOTIDINE IN NACL 20-0.9 MG/50ML-% IV SOLN
20.0000 mg | Freq: Once | INTRAVENOUS | Status: AC
  Administered 2023-08-13: 20 mg via INTRAVENOUS

## 2023-08-13 MED ORDER — FAMOTIDINE IN NACL 20-0.9 MG/50ML-% IV SOLN
20.0000 mg | Freq: Once | INTRAVENOUS | Status: AC | PRN
  Administered 2023-08-13: 20 mg via INTRAVENOUS

## 2023-08-13 NOTE — Progress Notes (Signed)
Hypersensitivity Reaction note  Date of event: 08/13/23 Time of event: 1004 Generic name of drug involved: Ferrlicit Name of provider notified of the hypersensitivity reaction: Hives and pain bottom of feet/ankles Was agent that likely caused hypersensitivity reaction added to Allergies List within EMR? yes Chain of events including reaction signs/symptoms, treatment administered, and outcome (e.g., drug resumed; drug discontinued; sent to Emergency Department; etc.) 1004 pt c/o pain and hives to feet/ankles. Infusion stopped NS to gravity. NP at chariside VO for Benadryl 50mg  IVP, Solumedrol 60mg  IVP. Pt denies chest pain, shortness of breath,   1029 pt c/o joint pain in fingers, stiffness to all other joints. NP notified  VO for 40mg  Pepcid , 60 mg solumedrol.  1055 pt c/o nausea, burning pain to feet, swelling to feet and hands, all over joint pain. Pt denies CP opr SOB. NP  and MD notified,  VO from MD Zofran 8mg  IVP  Pt to be observed x with NS kvo.  1225 MD at chairside to evaluate pt.  Pt states pain is better, still having soreness in joints, mild swelling to hands.  VO ok to D/C pt. Jolinda Croak, RN 08/13/2023 10:24 AM

## 2023-08-19 ENCOUNTER — Inpatient Hospital Stay: Payer: 59

## 2023-08-19 VITALS — BP 109/72 | HR 96 | Temp 98.2°F | Resp 20

## 2023-08-19 DIAGNOSIS — D509 Iron deficiency anemia, unspecified: Secondary | ICD-10-CM

## 2023-08-19 MED ORDER — SODIUM CHLORIDE 0.9 % IV SOLN
40.0000 mg | Freq: Once | INTRAVENOUS | Status: DC
Start: 2023-08-19 — End: 2023-08-19
  Filled 2023-08-19: qty 4

## 2023-08-19 MED ORDER — HEPARIN SOD (PORK) LOCK FLUSH 100 UNIT/ML IV SOLN: 500.0000 [IU] | Freq: Once | INTRAVENOUS | Status: DC

## 2023-08-19 MED ORDER — METHYLPREDNISOLONE SODIUM SUCC 125 MG IJ SOLR: 80.0000 mg | Freq: Once | INTRAMUSCULAR | Status: DC

## 2023-08-19 MED ORDER — SODIUM CHLORIDE 0.9 % IV SOLN: Freq: Once | INTRAVENOUS | Status: AC

## 2023-08-19 MED ORDER — SODIUM CHLORIDE 0.9% FLUSH: 10.0000 mL | INTRAVENOUS | Status: DC | PRN

## 2023-08-19 NOTE — Progress Notes (Signed)
As soon as pre-meds were ordered, I told the patient I was getting them now. Patient asked if iron had been ordered yet? I told her all I had at the moment was pre-meds and they are working on the iron dose. They then stated that they wanted to leave. They were told last week that they would have a different kind of iron. Same orders for ferrlecit were in the treatment plan. Brandy the pharmacist reached out to to different people trying to get another iron approved. The patient and mom were tired of waiting, did not want to get premeds, opted to return next week at her regular appt.

## 2023-08-20 ENCOUNTER — Encounter: Payer: Self-pay | Admitting: Neurology

## 2023-08-20 ENCOUNTER — Ambulatory Visit (INDEPENDENT_AMBULATORY_CARE_PROVIDER_SITE_OTHER): Payer: 59 | Admitting: Neurology

## 2023-08-20 VITALS — BP 114/69 | HR 109 | Ht 66.0 in | Wt 127.5 lb

## 2023-08-20 DIAGNOSIS — I639 Cerebral infarction, unspecified: Secondary | ICD-10-CM | POA: Diagnosis not present

## 2023-08-20 NOTE — Progress Notes (Signed)
Chief Complaint  Patient presents with   Hospitalization Follow-up    Rm 14, mother. Stable.  Has seen retina specialsit [facility], eye ok.  But still no change in vision.        ASSESSMENT AND PLAN  Christine [name] is a 23 y.o. 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   MEDICAL HISTORY:  Christine [name], is a 23 year old female seen in request by her primary care doctor Nadyne Coombes to follow-up for stroke, initial evaluation August 20, 2023  History is obtained from the patient and review of electronic medical records. I personally reviewed pertinent available imaging films in PACS.   PMHx of  Hypothyroidism Iron Deficiency Anemia, ferrtin 5,   Frequent GI issues. Depression anxiety  She is the only child of her family, lives with her mom at home, does not attending school, does not go to work, suffered long history of depression anxiety, required hospitalization in the past for suicidal attack, also complains of frequent asthma attack, especially sleeping at night, due to death reasons, she sleeps during the day, watch TV at night  On June 15, 2023, around 10 PM, while looking at her phone, she suddenly developed right visual field deficit, MRI showed acute infarction involving medial left occipital lobe, left PCA territory, status post TNK, considered to be embolic, echocardiogram, later TEE, cardiology evaluation showed probable small PFO, mild to  moderate amount of bubble right to left shunt, it was a difficult study due to tachycardia, poor visualization of the septum, pending cardiology follow-up in the next couple days, she was discharged to aspirin 81 mg daily, suffered iron deficiency anemia, was seen by oncologist, put on iron supplement, could not tolerate p.o., reported allergic reaction to IV iron supplements  She had long history of frequent GI issues, poor appetite, bloating, diarrhea, nausea, vomiting,  She was treated with different antidepression medication in the past, was not effective or could not tolerated  She had a persistent right visual field deficit, with a little recover, describe a wall of white, black floater in her right visual field, denies limb muscle difficulty  MRI of the brain acute infarction in the medial left occipital lobe, no acute hemorrhagic or mass effect  CT angiogram head and neck, moderate narrowing proximal left P2, with area of large caliber in the more distal P2, likely represent poststenotic dilation, no significant large vessel disease otherwise  Labs, ferritin 5, hemoglobin 8.4, negative ANA, A1c, lipid panel HIV, UDS, alcohol level, CMP, calcium of 8.8, sodium of 132,    PHYSICAL EXAM:   Vitals:   08/20/23 0953  BP: 114/69  Pulse: (!) 109  Weight: 127 lb 8 oz (57.8 kg)  Height: 5\' 6"  (1.676 m)     Body mass index is 20.58 kg/m.  PHYSICAL EXAMNIATION:  Gen: NAD, conversant, well nourised, well groomed                     Cardiovascular: Regular rate rhythm, no peripheral edema, warm, nontender. Eyes: Conjunctivae clear  without exudates or hemorrhage Neck: Supple, no carotid bruits. Pulmonary: Clear to auscultation bilaterally   NEUROLOGICAL EXAM:  MENTAL STATUS: Speech/cognition: Pale, fragile young female, awake, alert, oriented to history taking and casual conversation CRANIAL NERVES: CN II: Visual fields are full to confrontation. Pupils are round equal and briskly  reactive to light.  Visual field deficit CN III, IV, VI: extraocular movement are normal. No ptosis. CN V: Facial sensation is intact to light touch CN VII: Face is symmetric with normal eye closure  CN VIII: Hearing is normal to causal conversation. CN IX, X: Phonation is normal. CN XI: Head turning and shoulder shrug are intact  MOTOR: There is no pronator drift of out-stretched arms. Muscle bulk and tone are normal. Muscle strength is normal.  REFLEXES: Reflexes are 2+ and symmetric at the biceps, triceps, knees, and ankles. Plantar responses are flexor.  SENSORY: Intact to light touch, pinprick and vibratory sensation are intact in fingers and toes.  COORDINATION: There is no trunk or limb dysmetria noted.  GAIT/STANCE: Posture is normal. Gait is steady with normal steps, base, arm swing, and turning. Heel and toe walking are normal. Tandem gait is normal.  Romberg is absent.  REVIEW OF SYSTEMS:  Full 14 system review of systems performed and notable only for as above All other review of systems were negative.   ALLERGIES: Allergies  Allergen Reactions   Ferrlecit [Na Ferric Gluc Cplx In Sucrose] Hives, Nausea Only and Swelling    Hives, burning to feet,ankles, swelling to hands feet, ankles. All over joint pain, nausea   Escitalopram     Other Reaction(s): Eye Dialations   Flovent Hfa [Fluticasone] Diarrhea   Lactose Nausea And Vomiting    Lactose intolerance   Lactose Intolerance (Gi) Other (See Comments)    UNSPECIFIED SEVERITY/REACTION   Other Other (See Comments)    PET DANDER SEASONAL ALLERGIES ASTHMA "TREE NUTS" >> RASH   Pollen Extract Other (See Comments)    UNSPECIFIED REACTION    Sertraline     Other Reaction(s): Increase Depression   Coconut (Cocos Nucifera) Rash    HOME MEDICATIONS: Current Outpatient Medications  Medication Sig Dispense Refill   Acetaminophen (TYLENOL EXTRA STRENGTH PO) Take 2 tablets by mouth daily.     albuterol (VENTOLIN  HFA) 108 (90 Base) MCG/ACT inhaler USE 2 INHALATIONS BY MOUTH  INTO THE LUNGS EVERY 4  HOURS AS NEEDED FOR  WHEEZING (Patient taking differently: Inhale 2 puffs into the lungs as needed for wheezing or shortness of breath.) 18 g 0   ALPRAZolam (XANAX) 0.25 MG tablet Take 0.125 mg by mouth 3 (three) times daily as needed (for anxiety/panic attacks.).      aspirin 81 MG chewable tablet Chew 1 tablet (81 mg total) by mouth daily. 30 tablet 1   budesonide-formoterol (SYMBICORT) 160-4.5 MCG/ACT inhaler USE 2 INHALATIONS BY MOUTH TWICE DAILY with spacer and rinse mouth afterwards. (Patient taking differently: Inhale 2 puffs into the lungs in the morning and at bedtime.) 30.6 g 2   Carbinoxamine Maleate 4 MG TABS Take 1 tablet (4 mg total) by mouth every 6 (six) hours as needed. (Patient taking differently: Take 4 mg by mouth every 6 (six) hours as needed (allergies).) 270 tablet 2   cyclobenzaprine (FLEXERIL) 5 MG tablet Take 5 mg by mouth as needed (period cramps).     Drospirenone (SLYND) 4 MG TABS Take 1 tablet by mouth daily.     fluticasone (FLONASE) 50 MCG/ACT nasal spray Place 1 spray into both  nostrils 2 (two) times daily as needed for allergies or rhinitis. (Patient taking differently: Place 1 spray into both nostrils daily.) 48 g 2   hydrOXYzine (ATARAX/VISTARIL) 25 MG tablet Take 37.5 mg by mouth daily.     Hyoscyamine Sulfate SL 0.125 MG SUBL Take 0.125 mg by mouth 3 (three) times daily as needed.     levothyroxine (SYNTHROID) 75 MCG tablet Take 75 mcg by mouth daily.     montelukast (SINGULAIR) 10 MG tablet Take 1 tablet (10 mg total) by mouth at bedtime. (Patient taking differently: Take 10 mg by mouth daily.) 90 tablet 1   ondansetron (ZOFRAN) 4 MG tablet Take 4 mg by mouth as needed for nausea or vomiting.     pantoprazole (PROTONIX) 40 MG tablet Take 40 mg by mouth daily.     rosuvastatin (CRESTOR) 10 MG tablet Take 1 tablet (10 mg total) by mouth daily. 30 tablet 1   No current  facility-administered medications for this visit.    PAST MEDICAL HISTORY: Past Medical History:  Diagnosis Date   ADHD    Allergy    Anemia    reports history of slight anemia   Anxiety    Asthma    Coronary artery fistula    by echo per note 12/23/16 at [facility]   Depression    Ehlers-Danlos syndrome    diagnosed x63month ago   GERD (gastroesophageal reflux disease)    H. pylori infection    Heart murmur    Hyperprolactinemia (HCC)    Hypothyroidism    Iron deficiency anemia due to chronic blood loss 07/27/2023   Menorrhagia 07/27/2023   Other atopic dermatitis 11/19/2020   POTS (postural orthostatic tachycardia syndrome)    Urticaria    Vision abnormalities     PAST SURGICAL HISTORY: Past Surgical History:  Procedure Laterality Date   HYMENECTOMY     MOUTH SURGERY     TRANSESOPHAGEAL ECHOCARDIOGRAM (CATH LAB) N/A 06/16/2023   Procedure: TRANSESOPHAGEAL ECHOCARDIOGRAM;  Surgeon: Chrystie Nose, MD;  Location: [facility];  Service: Cardiovascular;  Laterality: N/A;    FAMILY HISTORY: Family History  Adopted: Yes  Problem Relation Age of Onset   Bipolar disorder Maternal Uncle    Allergic rhinitis Neg Hx    Asthma Neg Hx    Eczema Neg Hx    Urticaria Neg Hx     SOCIAL HISTORY: Social History   Socioeconomic History   Marital status: Single    Spouse name: Not on file   Number of children: Not on file   Years of education: Not on file   Highest education level: Not on file  Occupational History   Not on file  Tobacco Use   Smoking status: Never   Smokeless tobacco: Never  Vaping Use   Vaping status: Never Used  Substance and Sexual Activity   Alcohol use: No    Alcohol/week: 0.0 standard drinks of alcohol   Drug use: No   Sexual activity: Never    Birth control/protection: Abstinence    Comment: insurance questions declined  Other Topics Concern   Not on file  Social History Narrative   Caffiene 1-2 daily avg.   Working  no   Lives mom, 2 cats   Social Drivers of Corporate investment banker Strain: Not on file  Food Insecurity: Not on file  Transportation Needs: Not on file  Physical Activity: Not on file  Stress: Not on file  Social Connections: Not on file  Intimate Partner Violence: Not on file      Levert Feinstein, M.D. Ph.D.  [facility] [address] [place], [place] [address] Ph: [phone] Fax: [phone]  CC:  Rejeana Brock, MD [address] [place],  [place] [address]  Dois Davenport, MD

## 2023-08-20 NOTE — Patient Instructions (Signed)
Orders Placed This Encounter  Procedures   Hypercoagulable panel, comprehensive

## 2023-08-22 NOTE — Progress Notes (Unsigned)
Cardiology Office Note:    Date:  08/23/2023   ID:  Christine [name], DOB April 18, 2001, MRN [id number]  PCP:  Dois Davenport, MD  Cardiologist:  None  Electrophysiologist:  None   Referring MD: Dois Davenport, MD   Chief Complaint  Patient presents with   New Patient (Initial Visit)   Headache   Cerebrovascular Accident    History of Present Illness:    Christine [name] is a 23 y.o. female with a hx of CVA, Ehlers-Danlos syndrome, coronary artery fistula, hypothyroidism, iron deficiency anemia, POTS who is referred by Dr. Hal Hope for evaluation of CVA.  She was admitted 05/2023 with acute CVA.  She presented with right hemianopsia.  MRI showed acute ischemic infarct in the left medial occipital lobe.  TEE showed PFO that was discussed with structural team and not felt to be significant.  She was started on aspirin 81 mg daily.  It was recommended that she discuss with her OB/GYN changing her birth control as she was high risk for recurrent stroke.  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  Echocardiogram at [facility] in 2015 comments on small coronary cameral fistula, appears to drain to main pulmonary artery.  She reports has been having palpitations where feels like heart is skipping beats and sometimes racing, occurs every few days and can last couple minutes.  Reports feeling lightheaded when she stands if she is dehydrated.  She has had a lot of issues with nausea and vomiting which leads to dehydration and worsens lightheadedness.  Denies any smoking history.  No known family history of heart disease, she is adopted.  Past Medical History:  Diagnosis Date   ADHD    Allergy    Anemia    reports history of slight anemia   Anxiety    Asthma    Coronary artery fistula    by echo per note 12/23/16 at [facility]   Depression    Ehlers-Danlos syndrome    diagnosed x16month ago   GERD (gastroesophageal reflux disease)    H. pylori infection     Heart murmur    Hyperprolactinemia (HCC)    Hypothyroidism    Iron deficiency anemia due to chronic blood loss 07/27/2023   Menorrhagia 07/27/2023   Other atopic dermatitis 11/19/2020   POTS (postural orthostatic tachycardia syndrome)    Urticaria    Vision abnormalities     Past Surgical History:  Procedure Laterality Date   HYMENECTOMY     MOUTH SURGERY     TRANSESOPHAGEAL ECHOCARDIOGRAM (CATH LAB) N/A 06/16/2023   Procedure: TRANSESOPHAGEAL ECHOCARDIOGRAM;  Surgeon: Chrystie Nose, MD;  Location: [facility] INVASIVE CV LAB;  Service: Cardiovascular;  Laterality: N/A;    Current Medications: Current Meds  Medication Sig   Acetaminophen (TYLENOL EXTRA STRENGTH PO) Take 2 tablets by mouth daily.   albuterol (VENTOLIN HFA) 108 (90 Base) MCG/ACT inhaler USE 2 INHALATIONS BY MOUTH  INTO THE LUNGS EVERY 4  HOURS AS NEEDED FOR  WHEEZING (Patient taking differently: Inhale 2 puffs into the lungs as needed for wheezing or shortness of breath.)   ALPRAZolam (XANAX) 0.25 MG tablet Take 0.125 mg by mouth 3 (three) times daily as needed (for anxiety/panic attacks.).    aspirin 81 MG chewable tablet Chew 1 tablet (81 mg total) by mouth daily.   budesonide-formoterol (SYMBICORT) 160-4.5 MCG/ACT inhaler USE 2 INHALATIONS BY MOUTH TWICE DAILY with spacer and rinse mouth afterwards. (Patient taking differently: Inhale 2 puffs into the lungs in  the morning and at bedtime.)   Carbinoxamine Maleate 4 MG TABS Take 1 tablet (4 mg total) by mouth every 6 (six) hours as needed. (Patient taking differently: Take 4 mg by mouth every 6 (six) hours as needed (allergies).)   cyclobenzaprine (FLEXERIL) 5 MG tablet Take 5 mg by mouth as needed (period cramps).   Drospirenone (SLYND) 4 MG TABS Take 1 tablet by mouth daily.   fluticasone (FLONASE) 50 MCG/ACT nasal spray Place 1 spray into both nostrils 2 (two) times daily as needed for allergies or rhinitis. (Patient taking differently: Place 1 spray into both nostrils  daily.)   hydrOXYzine (ATARAX/VISTARIL) 25 MG tablet Take 37.5 mg by mouth daily.   Hyoscyamine Sulfate SL 0.125 MG SUBL Take 0.125 mg by mouth 3 (three) times daily as needed.   levothyroxine (SYNTHROID) 75 MCG tablet Take 75 mcg by mouth daily.   metoprolol tartrate (LOPRESSOR) 25 MG tablet Take 2 hours before CT scan.   montelukast (SINGULAIR) 10 MG tablet Take 1 tablet (10 mg total) by mouth at bedtime. (Patient taking differently: Take 10 mg by mouth daily.)   ondansetron (ZOFRAN) 4 MG tablet Take 4 mg by mouth as needed for nausea or vomiting.   pantoprazole (PROTONIX) 40 MG tablet Take 40 mg by mouth daily.   rosuvastatin (CRESTOR) 10 MG tablet Take 1 tablet (10 mg total) by mouth daily.     Allergies:   Ferrlecit [na ferric gluc cplx in sucrose], Escitalopram, Flovent hfa [fluticasone], Lactose, Lactose intolerance (gi), Other, Pollen extract, Sertraline, and Coconut (cocos nucifera)   Social History   Socioeconomic History   Marital status: Single    Spouse name: Not on file   Number of children: Not on file   Years of education: Not on file   Highest education level: Not on file  Occupational History   Not on file  Tobacco Use   Smoking status: Never   Smokeless tobacco: Never  Vaping Use   Vaping status: Never Used  Substance and Sexual Activity   Alcohol use: No    Alcohol/week: 0.0 standard drinks of alcohol   Drug use: No   Sexual activity: Never    Birth control/protection: Abstinence    Comment: insurance questions declined  Other Topics Concern   Not on file  Social History Narrative   Caffiene 1-2 daily avg.   Working no   Lives mom, 2 cats   Social Drivers of Corporate investment banker Strain: Not on file  Food Insecurity: Not on file  Transportation Needs: Not on file  Physical Activity: Not on file  Stress: Not on file  Social Connections: Not on file     Family History: The patient's family history includes Bipolar disorder in her maternal  uncle. There is no history of Allergic rhinitis, Asthma, Eczema, or Urticaria. She was adopted.  ROS:   Please see the history of present illness.     All other systems reviewed and are negative.  EKGs/Labs/Other Studies Reviewed:    The following studies were reviewed today:   EKG:   08/23/2023: Sinus rhythm, rate 87  Recent Labs: 06/14/2023: ALT 10; BUN 15; Creatinine, Ser 0.78; Potassium 4.1; Sodium 132 07/26/2023: Hemoglobin 8.4; Platelet Count 429  Recent Lipid Panel    Component Value Date/Time   CHOL 186 06/15/2023 0730   TRIG 147 06/15/2023 0730   HDL 76 06/15/2023 0730   CHOLHDL 2.4 06/15/2023 0730   VLDL 29 06/15/2023 0730   LDLCALC 81 06/15/2023 0730  Physical Exam:    VS:  BP 94/66 (BP Location: Left Arm, Patient Position: Sitting, Cuff Size: Normal)   Pulse 87   Ht 5\' 6"  (1.676 m)   Wt 127 lb (57.6 kg)   BMI 20.50 kg/m     Wt Readings from Last 3 Encounters:  08/23/23 127 lb (57.6 kg)  08/20/23 127 lb 8 oz (57.8 kg)  07/26/23 124 lb (56.2 kg)     GEN:  Well nourished, well developed in no acute distress HEENT: Normal NECK: No JVD; No carotid bruits LYMPHATICS: No lymphadenopathy CARDIAC: RRR, no murmurs, rubs, gallops RESPIRATORY:  Clear to auscultation without rales, wheezing or rhonchi  ABDOMEN: Soft, non-tender, non-distended MUSCULOSKELETAL:  No edema; No deformity  SKIN: Warm and dry NEUROLOGIC:  Alert and oriented x 3 PSYCHIATRIC:  Normal affect   ASSESSMENT:    1. Coronary artery fistula   2. Cerebrovascular accident (CVA), unspecified mechanism (HCC)   3. Palpitations    PLAN:    CVA: She was admitted 05/2023 with acute CVA.  She presented with right hemianopsia.  MRI showed acute ischemic infarct in the left medial occipital lobe.  TEE showed small PFO that was discussed with structural team and not felt to be significant.  She was started on aspirin 81 mg daily.  It was recommended that she discuss with her OB/GYN changing her  birth control as she was high risk for recurrent stroke. -Continue aspirin, statin -Recommend Zio patch x 2 weeks  Coronary artery fistula: She has a history of reported coronary artery fistula.  Echocardiogram at [facility] in 2015 comments on small coronary cameral fistula, appears to drain to main pulmonary artery.  Recommend coronary CTA  RTC in 3 months   Medication Adjustments/Labs and Tests Ordered: Current medicines are reviewed at length with the patient today.  Concerns regarding medicines are outlined above.  Orders Placed This Encounter  Procedures   CT CORONARY MORPH W/CTA COR W/SCORE W/CA W/CM &/OR WO/CM   Basic Metabolic Panel (BMET)   EKG 12-Lead   Meds ordered this encounter  Medications   metoprolol tartrate (LOPRESSOR) 25 MG tablet    Sig: Take 2 hours before CT scan.    Dispense:  1 tablet    Refill:  0    Patient Instructions  Medication Instructions:  Continue all current medications *If you need a refill on your cardiac medications before your next appointment, please call your pharmacy*   Lab Work: Bmet today If you have labs (blood work) drawn today and your tests are completely normal, you will receive your results only by: MyChart Message (if you have MyChart) OR A paper copy in the mail If you have any lab test that is abnormal or we need to change your treatment, we will call you to review the results.   Testing/Procedures: [name]- Long Term Monitor Instructions  Your physician has requested you wear a ZIO patch monitor for 14 days.  This is a single patch monitor. [employer] supplies one patch monitor per enrollment. Additional stickers are not available. Please do not apply patch if you will be having a Nuclear Stress Test,  Echocardiogram, Cardiac CT, MRI, or Chest Xray during the period you would be wearing the  monitor. The patch cannot be worn during these tests. You cannot remove and re-apply the  ZIO XT patch monitor.  Your ZIO patch  monitor will be mailed 3 day USPS to your address on file. It may take 3-5 days  to receive  your monitor after you have been enrolled.  Once you have received your monitor, please review the enclosed instructions. Your monitor  has already been registered assigning a specific monitor serial # to you.  Billing and Patient Assistance Program Information  We have supplied [employer] with any of your insurance information on file for billing purposes. [employer] offers a sliding scale Patient Assistance Program for patients that do not have  insurance, or whose insurance does not completely cover the cost of the ZIO monitor.  You must apply for the Patient Assistance Program to qualify for this discounted rate.  To apply, please call [employer] at [phone], select option 4, select option 2, ask to apply for  Patient Assistance Program. [name] will ask your household income, and how many people  are in your household. They will quote your out-of-pocket cost based on that information.  [employer] will also be able to set up a 62-month, interest-free payment plan if needed.  Applying the monitor   Shave hair from upper left chest.  Hold abrader disc by orange tab. Rub abrader in 40 strokes over the upper left chest as  indicated in your monitor instructions.  Clean area with 4 enclosed alcohol pads. Let dry.  Apply patch as indicated in monitor instructions. Patch will be placed under collarbone on left  side of chest with arrow pointing upward.  Rub patch adhesive wings for 2 minutes. Remove white label marked "1". Remove the white  label marked "2". Rub patch adhesive wings for 2 additional minutes.  While looking in a mirror, press and release button in center of patch. A small green light will  flash 3-4 times. This will be your only indicator that the monitor has been turned on.  Do not shower for the first 24 hours. You may shower after the first 24 hours.  Press the button if you feel a  symptom. You will hear a small click. Record Date, Time and  Symptom in the Patient Logbook.  When you are ready to remove the patch, follow instructions on the last 2 pages of Patient  Logbook. Stick patch monitor onto the last page of Patient Logbook.  Place Patient Logbook in the blue and white box. Use locking tab on box and tape box closed  securely. The blue and white box has prepaid postage on it. Please place it in the mailbox as  soon as possible. Your physician should have your test results approximately 7 days after the  monitor has been mailed back to [facility].  Call [facility] Customer Care at [phone] if you have questions regarding  your ZIO XT patch monitor. Call them immediately if you see an orange light blinking on your  monitor.  If your monitor falls off in less than 4 days, contact our Monitor department at [phone].  If your monitor becomes loose or falls off after 4 days call [employer] at [phone] for  suggestions on securing your monitor    Follow-Up: At [facility], you and your health needs are our priority.  As part of our continuing mission to provide you with exceptional heart care, we have created designated Provider Care Teams.  These Care Teams include your primary Cardiologist (physician) and Advanced Practice Providers (APPs -  Physician Assistants and Nurse Practitioners) who all work together to provide you with the care you need, when you need it.  We recommend signing up for the patient portal called "MyChart".  Sign up information is provided on this After  Visit Summary.  MyChart is used to connect with patients for Virtual Visits (Telemedicine).  Patients are able to view lab/test results, encounter notes, upcoming appointments, etc.  Non-urgent messages can be sent to your provider as well.   To learn more about what you can do with MyChart, go to [email or web address].    Your next appointment:   3  month(s)  Provider:   Dr. Bjorn Pippin Other Instructions   Your cardiac CT will be scheduled at one of the below locations:   [facility] [address] [place], [place] [address] [phone]   If scheduled at [facility], please arrive at the [facility] and Children's Entrance (Entrance C2) of [facility] 30 minutes prior to test start time. You can use the FREE valet parking offered at entrance C (encouraged to control the heart rate for the test)  Proceed to the [facility] Radiology Department (first floor) to check-in and test prep.  All radiology patients and guests should use entrance C2 at [facility], accessed from [facility], even though the hospital's physical address listed is [address].     Please follow these instructions carefully (unless otherwise directed):  An IV will be required for this test and Nitroglycerin will be given.    On the Night Before the Test: Be sure to Drink plenty of water. Do not consume any caffeinated/decaffeinated beverages or chocolate 12 hours prior to your test. Do not take any antihistamines 12 hours prior to your test.  Drink plenty of water until 1 hour prior to the test. Do not eat any food 1 hour prior to test. You may take your regular medications prior to the test.  Take metoprolol (Lopressor) 25mg   two hours prior to test. If you take Furosemide/Hydrochlorothiazide/Spironolactone/Chlorthalidone, please HOLD on the morning of the test. Patients who wear a continuous glucose monitor MUST remove the device prior to scanning. FEMALES- please wear underwire-free bra if available, avoid dresses & tight clothing  After the Test: Drink plenty of water. After receiving IV contrast, you may experience a mild flushed feeling. This is normal. On occasion, you may experience a mild rash up to 24 hours after the test. This is not dangerous. If this occurs, you can take Benadryl  25 mg and increase your fluid intake. If you experience trouble breathing, this can be serious. If it is severe call 911 IMMEDIATELY. If it is mild, please call our office.  We will call to schedule your test 2-4 weeks out understanding that some insurance companies will need an authorization prior to the service being performed.   For more information and frequently asked questions, please visit our website : [email or web address]  For non-scheduling related questions, please contact the cardiac imaging nurse navigator should you have any questions/concerns: Cardiac Imaging Nurse Navigators Direct Office Dial: [phone]   For scheduling needs, including cancellations and rescheduling, please call [place], [phone].          Signed, Little Ishikawa, MD  08/23/2023 5:35 PM    [facility]

## 2023-08-23 ENCOUNTER — Other Ambulatory Visit: Payer: Self-pay | Admitting: Cardiology

## 2023-08-23 ENCOUNTER — Encounter: Payer: Self-pay | Admitting: Cardiology

## 2023-08-23 ENCOUNTER — Ambulatory Visit (INDEPENDENT_AMBULATORY_CARE_PROVIDER_SITE_OTHER): Payer: 59

## 2023-08-23 ENCOUNTER — Ambulatory Visit: Payer: 59 | Attending: Cardiology | Admitting: Cardiology

## 2023-08-23 VITALS — BP 94/66 | HR 87 | Ht 66.0 in | Wt 127.0 lb

## 2023-08-23 DIAGNOSIS — I639 Cerebral infarction, unspecified: Secondary | ICD-10-CM

## 2023-08-23 DIAGNOSIS — I63239 Cerebral infarction due to unspecified occlusion or stenosis of unspecified carotid arteries: Secondary | ICD-10-CM

## 2023-08-23 DIAGNOSIS — I2541 Coronary artery aneurysm: Secondary | ICD-10-CM

## 2023-08-23 DIAGNOSIS — R002 Palpitations: Secondary | ICD-10-CM

## 2023-08-23 DIAGNOSIS — G90A Postural orthostatic tachycardia syndrome (POTS): Secondary | ICD-10-CM

## 2023-08-23 DIAGNOSIS — I9589 Other hypotension: Secondary | ICD-10-CM

## 2023-08-23 MED ORDER — METOPROLOL TARTRATE 25 MG PO TABS: ORAL_TABLET | ORAL | 0 refills | Status: DC

## 2023-08-23 NOTE — Patient Instructions (Addendum)
Medication Instructions:  Continue all current medications *If you need a refill on your cardiac medications before your next appointment, please call your pharmacy*   Lab Work: Bmet today If you have labs (blood work) drawn today and your tests are completely normal, you will receive your results only by: MyChart Message (if you have MyChart) OR A paper copy in the mail If you have any lab test that is abnormal or we need to change your treatment, we will call you to review the results.   Testing/Procedures: [name]- Long Term Monitor Instructions  Your physician has requested you wear a ZIO patch monitor for 14 days.  This is a single patch monitor. [employer] supplies one patch monitor per enrollment. Additional stickers are not available. Please do not apply patch if you will be having a Nuclear Stress Test,  Echocardiogram, Cardiac CT, MRI, or Chest Xray during the period you would be wearing the  monitor. The patch cannot be worn during these tests. You cannot remove and re-apply the  ZIO XT patch monitor.  Your ZIO patch monitor will be mailed 3 day USPS to your address on file. It may take 3-5 days  to receive your monitor after you have been enrolled.  Once you have received your monitor, please review the enclosed instructions. Your monitor  has already been registered assigning a specific monitor serial # to you.  Billing and Patient Assistance Program Information  We have supplied [employer] with any of your insurance information on file for billing purposes. [employer] offers a sliding scale Patient Assistance Program for patients that do not have  insurance, or whose insurance does not completely cover the cost of the ZIO monitor.  You must apply for the Patient Assistance Program to qualify for this discounted rate.  To apply, please call [employer] at [phone], select option 4, select option 2, ask to apply for  Patient Assistance Program. [name] will ask your household  income, and how many people  are in your household. They will quote your out-of-pocket cost based on that information.  [employer] will also be able to set up a 62-month, interest-free payment plan if needed.  Applying the monitor   Shave hair from upper left chest.  Hold abrader disc by orange tab. Rub abrader in 40 strokes over the upper left chest as  indicated in your monitor instructions.  Clean area with 4 enclosed alcohol pads. Let dry.  Apply patch as indicated in monitor instructions. Patch will be placed under collarbone on left  side of chest with arrow pointing upward.  Rub patch adhesive wings for 2 minutes. Remove white label marked "1". Remove the white  label marked "2". Rub patch adhesive wings for 2 additional minutes.  While looking in a mirror, press and release button in center of patch. A small green light will  flash 3-4 times. This will be your only indicator that the monitor has been turned on.  Do not shower for the first 24 hours. You may shower after the first 24 hours.  Press the button if you feel a symptom. You will hear a small click. Record Date, Time and  Symptom in the Patient Logbook.  When you are ready to remove the patch, follow instructions on the last 2 pages of Patient  Logbook. Stick patch monitor onto the last page of Patient Logbook.  Place Patient Logbook in the blue and white box. Use locking tab on box and tape box closed  securely. The blue  and white box has prepaid postage on it. Please place it in the mailbox as  soon as possible. Your physician should have your test results approximately 7 days after the  monitor has been mailed back to [facility].  Call [facility] Customer Care at [phone] if you have questions regarding  your ZIO XT patch monitor. Call them immediately if you see an orange light blinking on your  monitor.  If your monitor falls off in less than 4 days, contact our Monitor department at [phone].  If your  monitor becomes loose or falls off after 4 days call [employer] at [phone] for  suggestions on securing your monitor    Follow-Up: At [facility], you and your health needs are our priority.  As part of our continuing mission to provide you with exceptional heart care, we have created designated Provider Care Teams.  These Care Teams include your primary Cardiologist (physician) and Advanced Practice Providers (APPs -  Physician Assistants and Nurse Practitioners) who all work together to provide you with the care you need, when you need it.  We recommend signing up for the patient portal called "MyChart".  Sign up information is provided on this After Visit Summary.  MyChart is used to connect with patients for Virtual Visits (Telemedicine).  Patients are able to view lab/test results, encounter notes, upcoming appointments, etc.  Non-urgent messages can be sent to your provider as well.   To learn more about what you can do with MyChart, go to [email or web address].    Your next appointment:   3 month(s)  Provider:   Dr. Bjorn Pippin Other Instructions   Your cardiac CT will be scheduled at one of the below locations:   [facility] [address] [place], [place] [address] [phone]   If scheduled at [facility], please arrive at the [facility] (Entrance C2) of [facility] 30 minutes prior to test start time. You can use the FREE valet parking offered at entrance C (encouraged to control the heart rate for the test)  Proceed to the [facility] Radiology Department (first floor) to check-in and test prep.  All radiology patients and guests should use entrance C2 at [facility], accessed from [facility], even though the hospital's physical address listed is [address]     Please follow these instructions carefully (unless otherwise directed):  An IV will be required for this test  and Nitroglycerin will be given.    On the Night Before the Test: Be sure to Drink plenty of water. Do not consume any caffeinated/decaffeinated beverages or chocolate 12 hours prior to your test. Do not take any antihistamines 12 hours prior to your test.  Drink plenty of water until 1 hour prior to the test. Do not eat any food 1 hour prior to test. You may take your regular medications prior to the test.  Take metoprolol (Lopressor) 25mg   two hours prior to test. If you take Furosemide/Hydrochlorothiazide/Spironolactone/Chlorthalidone, please HOLD on the morning of the test. Patients who wear a continuous glucose monitor MUST remove the device prior to scanning. FEMALES- please wear underwire-free bra if available, avoid dresses & tight clothing  After the Test: Drink plenty of water. After receiving IV contrast, you may experience a mild flushed feeling. This is normal. On occasion, you may experience a mild rash up to 24 hours after the test. This is not dangerous. If this occurs, you can take Benadryl 25 mg  and increase your fluid intake. If you experience trouble breathing, this can be serious. If it is severe call 911 IMMEDIATELY. If it is mild, please call our office.  We will call to schedule your test 2-4 weeks out understanding that some insurance companies will need an authorization prior to the service being performed.   For more information and frequently asked questions, please visit our website : [email or web address]  For non-scheduling related questions, please contact the cardiac imaging nurse navigator should you have any questions/concerns: Cardiac Imaging Nurse Navigators Direct Office Dial: [phone]   For scheduling needs, including cancellations and rescheduling, please call [place], [phone].

## 2023-08-23 NOTE — Progress Notes (Unsigned)
Enrolled for [employer] to mail a ZIO XT long term holter monitor to the patients address on file.

## 2023-08-24 ENCOUNTER — Telehealth: Payer: Self-pay

## 2023-08-24 NOTE — Telephone Encounter (Signed)
Patients mom called inquiring if patient was good to go with insurance and pharmacy for her new iron Thursday. Spoke with PA team who states no auth required for venofer and spoke with [place] in pharmacy. Venofer 200mg  order is in with premedications. Called patients mom back and informed her and informed her of premedications she will be getting. Vernona Rieger (patients mom) was appreciative and also inquired about not being charged for last weeks visit. Informed her I would send a message to our manager. Discussed with Eben Burow who states to make sure no charges were filed under charge capture and if so to addend the appt and cancel charges.   Attempted to call Vernona Rieger back and informed her, no answer. Will discuss with her when she comes in on Thursday.

## 2023-08-25 ENCOUNTER — Telehealth: Payer: Self-pay | Admitting: *Deleted

## 2023-08-25 NOTE — Telephone Encounter (Signed)
Mother called to ask if ok to take Levsin, Flonase, Hydroxyzine and Carbinoxamine in the morning even though she is getting premeds for her iron.  Checked with Dr Myna Hidalgo.  Ok to take these medications with premeds given for iron.  Mother called back with this information

## 2023-08-26 ENCOUNTER — Inpatient Hospital Stay: Payer: 59

## 2023-08-26 VITALS — BP 105/70 | HR 90 | Temp 98.8°F | Resp 18

## 2023-08-26 DIAGNOSIS — D500 Iron deficiency anemia secondary to blood loss (chronic): Secondary | ICD-10-CM

## 2023-08-26 DIAGNOSIS — N921 Excessive and frequent menstruation with irregular cycle: Secondary | ICD-10-CM

## 2023-08-26 MED ORDER — LORATADINE 10 MG PO TABS
10.0000 mg | ORAL_TABLET | Freq: Once | ORAL | Status: AC
  Administered 2023-08-26: 10 mg via ORAL
  Filled 2023-08-26: qty 1

## 2023-08-26 MED ORDER — FAMOTIDINE IN NACL 20-0.9 MG/50ML-% IV SOLN
20.0000 mg | INTRAVENOUS | Status: AC
Start: 2023-08-26 — End: 2023-08-26
  Administered 2023-08-26 (×2): 20 mg via INTRAVENOUS
  Filled 2023-08-26: qty 50

## 2023-08-26 MED ORDER — IRON SUCROSE 20 MG/ML IV SOLN
200.0000 mg | Freq: Once | INTRAVENOUS | Status: AC
Start: 2023-08-26 — End: 2023-08-26
  Administered 2023-08-26: 200 mg via INTRAVENOUS
  Filled 2023-08-26: qty 10

## 2023-08-26 MED ORDER — ACETAMINOPHEN 325 MG PO TABS
650.0000 mg | ORAL_TABLET | Freq: Once | ORAL | Status: DC
  Filled 2023-08-26: qty 2

## 2023-08-26 MED ORDER — SODIUM CHLORIDE 0.9 % IV SOLN: INTRAVENOUS | Status: DC

## 2023-08-26 MED ORDER — METHYLPREDNISOLONE SODIUM SUCC 125 MG IJ SOLR
80.0000 mg | Freq: Once | INTRAMUSCULAR | Status: AC
  Administered 2023-08-26: 80 mg via INTRAVENOUS
  Filled 2023-08-26: qty 2

## 2023-08-26 NOTE — Patient Instructions (Signed)

## 2023-08-29 DIAGNOSIS — I63239 Cerebral infarction due to unspecified occlusion or stenosis of unspecified carotid arteries: Secondary | ICD-10-CM

## 2023-08-29 DIAGNOSIS — G90A Postural orthostatic tachycardia syndrome (POTS): Secondary | ICD-10-CM | POA: Diagnosis not present

## 2023-08-29 DIAGNOSIS — R002 Palpitations: Secondary | ICD-10-CM

## 2023-08-31 ENCOUNTER — Other Ambulatory Visit: Payer: 59

## 2023-08-31 ENCOUNTER — Ambulatory Visit: Payer: 59 | Admitting: Family

## 2023-09-02 ENCOUNTER — Inpatient Hospital Stay: Payer: 59 | Attending: Hematology & Oncology

## 2023-09-10 ENCOUNTER — Inpatient Hospital Stay: Payer: 59 | Attending: Hematology & Oncology

## 2023-09-10 ENCOUNTER — Inpatient Hospital Stay: Payer: 59 | Admitting: Family

## 2023-09-10 ENCOUNTER — Telehealth: Payer: Self-pay | Admitting: Cardiology

## 2023-09-10 ENCOUNTER — Inpatient Hospital Stay: Payer: 59

## 2023-09-10 VITALS — BP 118/69 | HR 91 | Temp 98.7°F | Resp 18

## 2023-09-10 DIAGNOSIS — D500 Iron deficiency anemia secondary to blood loss (chronic): Secondary | ICD-10-CM

## 2023-09-10 DIAGNOSIS — N920 Excessive and frequent menstruation with regular cycle: Secondary | ICD-10-CM | POA: Diagnosis present

## 2023-09-10 DIAGNOSIS — N921 Excessive and frequent menstruation with irregular cycle: Secondary | ICD-10-CM

## 2023-09-10 MED ORDER — ACETAMINOPHEN 325 MG PO TABS: 650.0000 mg | ORAL_TABLET | Freq: Once | ORAL | Status: DC

## 2023-09-10 MED ORDER — IRON SUCROSE 20 MG/ML IV SOLN
200.0000 mg | Freq: Once | INTRAVENOUS | Status: AC
  Administered 2023-09-10: 200 mg via INTRAVENOUS
  Filled 2023-09-10: qty 10

## 2023-09-10 MED ORDER — METHYLPREDNISOLONE SODIUM SUCC 125 MG IJ SOLR
80.0000 mg | Freq: Once | INTRAMUSCULAR | Status: AC
Start: 2023-09-10 — End: 2023-09-10
  Administered 2023-09-10: 80 mg via INTRAVENOUS
  Filled 2023-09-10: qty 2

## 2023-09-10 MED ORDER — SODIUM CHLORIDE 0.9 % IV SOLN: 40.0000 mg | Freq: Once | INTRAVENOUS | Status: DC

## 2023-09-10 MED ORDER — FAMOTIDINE IN NACL 20-0.9 MG/50ML-% IV SOLN
20.0000 mg | INTRAVENOUS | Status: AC
  Administered 2023-09-10 (×2): 20 mg via INTRAVENOUS
  Filled 2023-09-10: qty 50

## 2023-09-10 MED ORDER — LORATADINE 10 MG PO TABS
10.0000 mg | ORAL_TABLET | Freq: Once | ORAL | Status: AC
  Administered 2023-09-10: 10 mg via ORAL
  Filled 2023-09-10: qty 1

## 2023-09-10 MED ORDER — SODIUM CHLORIDE 0.9 % IV SOLN: INTRAVENOUS | Status: DC

## 2023-09-10 NOTE — Progress Notes (Signed)
Patient extremely anxious today.  Mother at her side. Patient tolerated treatment well. Patient opted  not to stay for the 30 minute post iron observation. Informed patient that protocol is to observe patient for 30 minutes after iron infusion. VSS

## 2023-09-10 NOTE — Telephone Encounter (Signed)
Patient identification verified by 2 forms. Marilynn Rail, RN    Called and spoke to patients mother Darrell Jewel states:   -patient has a rash, very red   -monitor came off today   -it was due to be returned Sunday  Informed laura:   -rash likely due to sensitivity    -rash should resolve now that monitor is off   -can place hydrocortisone in area for rash   -will send message to Dr. Bjorn Pippin unsure if additional monitor needed  Vernona Rieger verbalized understanding, no questions at this time

## 2023-09-10 NOTE — Telephone Encounter (Signed)
Mother Vernona Rieger) stated patient's device has fallen off and her 2-week test was due to end on Sunday.  Mother noted patient developed a very bad rash and wants to know next steps.

## 2023-09-10 NOTE — Patient Instructions (Signed)

## 2023-09-12 NOTE — Telephone Encounter (Signed)
 Okay to send in monitor now, does not need additional monitor

## 2023-09-13 NOTE — Telephone Encounter (Signed)
 Patient identification verified by 2 forms. Christine Rail, RN    Called and spoke to patients mother Christine [name] provider message below  Christine [name] verbalized understanding, no questions at this tim

## 2023-09-21 ENCOUNTER — Inpatient Hospital Stay: Payer: 59

## 2023-09-21 VITALS — BP 116/66 | HR 92 | Temp 98.2°F | Resp 18

## 2023-09-21 DIAGNOSIS — N921 Excessive and frequent menstruation with irregular cycle: Secondary | ICD-10-CM

## 2023-09-21 DIAGNOSIS — D500 Iron deficiency anemia secondary to blood loss (chronic): Secondary | ICD-10-CM | POA: Diagnosis not present

## 2023-09-21 MED ORDER — METHYLPREDNISOLONE SODIUM SUCC 125 MG IJ SOLR
80.0000 mg | Freq: Once | INTRAMUSCULAR | Status: AC
  Administered 2023-09-21: 80 mg via INTRAVENOUS
  Filled 2023-09-21: qty 2

## 2023-09-21 MED ORDER — ACETAMINOPHEN 325 MG PO TABS: 650.0000 mg | ORAL_TABLET | Freq: Once | ORAL | Status: DC

## 2023-09-21 MED ORDER — IRON SUCROSE 20 MG/ML IV SOLN
200.0000 mg | Freq: Once | INTRAVENOUS | Status: AC
  Administered 2023-09-21: 200 mg via INTRAVENOUS
  Filled 2023-09-21: qty 10

## 2023-09-21 MED ORDER — FAMOTIDINE IN NACL 20-0.9 MG/50ML-% IV SOLN
20.0000 mg | INTRAVENOUS | Status: AC
  Administered 2023-09-21 (×2): 20 mg via INTRAVENOUS
  Filled 2023-09-21: qty 50

## 2023-09-21 MED ORDER — LORATADINE 10 MG PO TABS
10.0000 mg | ORAL_TABLET | Freq: Once | ORAL | Status: AC
  Administered 2023-09-21: 10 mg via ORAL
  Filled 2023-09-21: qty 1

## 2023-09-21 MED ORDER — SODIUM CHLORIDE 0.9 % IV SOLN: INTRAVENOUS | Status: DC

## 2023-09-21 NOTE — Progress Notes (Signed)
 Patient does not want to stay for the recommended 30 minute post IV iron observation. Patient discharged ambulatory without complaints or concerns.

## 2023-09-21 NOTE — Patient Instructions (Signed)

## 2023-09-22 ENCOUNTER — Telehealth: Payer: Self-pay

## 2023-09-22 NOTE — Patient Outreach (Signed)
 First telephone outreach attempt to obtain mRS. No answer. Left message for returned call.  [facility]  Population Health Care Management Assistant  Direct Dial: [phone]  Fax: [phone] Website: [email or web address]

## 2023-09-28 ENCOUNTER — Telehealth: Payer: Self-pay

## 2023-09-28 NOTE — Patient Outreach (Signed)
 Second telephone outreach attempt to obtain mRS. No answer. Left message for returned call.  [facility]  Population Health Care Management Assistant  Direct Dial: [phone]  Fax: [phone] Website: [email or web address]

## 2023-09-29 ENCOUNTER — Telehealth: Payer: Self-pay

## 2023-09-29 ENCOUNTER — Ambulatory Visit (HOSPITAL_COMMUNITY): Payer: 59

## 2023-09-29 NOTE — Patient Outreach (Signed)
 Telephone outreach to patient's mother to obtain mRS was successfully completed. MRS= 1  [facility] Health Care Management Assistant  Direct Dial: [phone]  Fax: [phone] Website: [email or web address]

## 2023-09-30 ENCOUNTER — Other Ambulatory Visit: Payer: Self-pay

## 2023-09-30 DIAGNOSIS — I639 Cerebral infarction, unspecified: Secondary | ICD-10-CM

## 2023-09-30 LAB — BASIC METABOLIC PANEL
BUN/Creatinine Ratio: 11 (ref 9–23)
BUN: 8 mg/dL (ref 6–20)
CO2: 18 mmol/L — ABNORMAL LOW (ref 20–29)
Calcium: 9.3 mg/dL (ref 8.7–10.2)
Chloride: 105 mmol/L (ref 96–106)
Creatinine, Ser: 0.75 mg/dL (ref 0.57–1.00)
Glucose: 93 mg/dL (ref 70–99)
Potassium: 4 mmol/L (ref 3.5–5.2)
Sodium: 138 mmol/L (ref 134–144)
eGFR: 114 mL/min/{1.73_m2} (ref >59)

## 2023-10-04 ENCOUNTER — Encounter (HOSPITAL_COMMUNITY): Payer: Self-pay

## 2023-10-05 ENCOUNTER — Inpatient Hospital Stay: Payer: 59

## 2023-10-05 ENCOUNTER — Telehealth (HOSPITAL_COMMUNITY): Payer: Self-pay | Admitting: *Deleted

## 2023-10-05 NOTE — Telephone Encounter (Signed)
 Reaching out to patient to offer assistance regarding upcoming cardiac imaging study; spoke to mother (ok per DPR) verbalizes understanding of appt date/time, parking situation and where to check in, pre-test NPO status and medications ordered, and verified current allergies; name and call back number provided for further questions should they arise Johney Frame RN Navigator Cardiac Imaging [facility] [phone] office [phone] cell

## 2023-10-06 ENCOUNTER — Ambulatory Visit (HOSPITAL_COMMUNITY)
Admission: RE | Admit: 2023-10-06 | Discharge: 2023-10-06 | Disposition: A | Discharge status: Home / Self Care | Payer: 59 | Source: Ambulatory Visit | Attending: Cardiology | Admitting: Cardiology

## 2023-10-06 DIAGNOSIS — I2541 Coronary artery aneurysm: Secondary | ICD-10-CM | POA: Insufficient documentation

## 2023-10-06 MED ORDER — METOPROLOL TARTRATE 5 MG/5ML IV SOLN
10.0000 mg | Freq: Once | INTRAVENOUS | Status: AC
  Administered 2023-10-06: 5 mg via INTRAVENOUS

## 2023-10-06 MED ORDER — METOPROLOL TARTRATE 5 MG/5ML IV SOLN
INTRAVENOUS | Status: AC
  Filled 2023-10-06: qty 10

## 2023-10-06 MED ORDER — NITROGLYCERIN 0.4 MG SL SUBL
SUBLINGUAL_TABLET | SUBLINGUAL | Status: AC
  Filled 2023-10-06: qty 2

## 2023-10-06 MED ORDER — IOHEXOL 350 MG/ML SOLN
95.0000 mL | Freq: Once | INTRAVENOUS | Status: AC | PRN
  Administered 2023-10-06: 95 mL via INTRAVENOUS

## 2023-10-06 MED ORDER — NITROGLYCERIN 0.4 MG SL SUBL
0.8000 mg | SUBLINGUAL_TABLET | Freq: Once | SUBLINGUAL | Status: AC
  Administered 2023-10-06: 0.8 mg via SUBLINGUAL

## 2023-10-11 ENCOUNTER — Telehealth: Payer: Self-pay | Admitting: Cardiology

## 2023-10-11 NOTE — Telephone Encounter (Signed)
 Pt c/o medication issue:  1. Name of Medication: rosuvastatin (CRESTOR) 10 MG tablet   2. How are you currently taking this medication (dosage and times per day)?    3. Are you having a reaction (difficulty breathing--STAT)? no  4. What is your medication issue? Mother would like for Dr. Bjorn Pippin to be the one to start prescribing this medication to the patient. Please advise    If so please sent ZO:[id number] - [place], [place] - [address]

## 2023-10-13 ENCOUNTER — Other Ambulatory Visit: Payer: Self-pay | Admitting: Cardiology

## 2023-10-13 MED ORDER — ROSUVASTATIN CALCIUM 10 MG PO TABS: 10.0000 mg | ORAL_TABLET | Freq: Every day | ORAL | 1 refills | Status: DC

## 2023-10-13 NOTE — Telephone Encounter (Signed)
 Yes we can prescribe

## 2023-10-13 NOTE — Telephone Encounter (Signed)
Refill sent to the pharmacy electronically.  

## 2023-10-18 ENCOUNTER — Other Ambulatory Visit: Payer: Self-pay | Admitting: Family

## 2023-10-18 DIAGNOSIS — D500 Iron deficiency anemia secondary to blood loss (chronic): Secondary | ICD-10-CM

## 2023-10-18 DIAGNOSIS — N921 Excessive and frequent menstruation with irregular cycle: Secondary | ICD-10-CM

## 2023-10-19 ENCOUNTER — Inpatient Hospital Stay: Payer: 59

## 2023-10-19 ENCOUNTER — Inpatient Hospital Stay: Payer: 59 | Attending: Hematology & Oncology

## 2023-10-19 ENCOUNTER — Inpatient Hospital Stay (HOSPITAL_BASED_OUTPATIENT_CLINIC_OR_DEPARTMENT_OTHER): Payer: 59 | Admitting: Family

## 2023-10-19 ENCOUNTER — Encounter: Payer: Self-pay | Admitting: Family

## 2023-10-19 VITALS — BP 132/75 | HR 109 | Temp 99.1°F | Resp 17 | Wt 140.4 lb

## 2023-10-19 DIAGNOSIS — D500 Iron deficiency anemia secondary to blood loss (chronic): Secondary | ICD-10-CM

## 2023-10-19 DIAGNOSIS — N921 Excessive and frequent menstruation with irregular cycle: Secondary | ICD-10-CM

## 2023-10-19 DIAGNOSIS — D509 Iron deficiency anemia, unspecified: Secondary | ICD-10-CM | POA: Insufficient documentation

## 2023-10-19 LAB — CBC WITH DIFFERENTIAL (CANCER CENTER ONLY)
Abs Immature Granulocytes: 0.01 10*3/uL (ref 0.00–0.07)
Basophils Absolute: 0.1 10*3/uL (ref 0.0–0.1)
Basophils Relative: 1 %
Eosinophils Absolute: 0.1 10*3/uL (ref 0.0–0.5)
Eosinophils Relative: 1 %
HCT: 35.1 % — ABNORMAL LOW (ref 36.0–46.0)
Hemoglobin: 11.5 g/dL — ABNORMAL LOW (ref 12.0–15.0)
Immature Granulocytes: 0 %
Lymphocytes Relative: 36 %
Lymphs Abs: 2 10*3/uL (ref 0.7–4.0)
MCH: 29.5 pg (ref 26.0–34.0)
MCHC: 32.8 g/dL (ref 30.0–36.0)
MCV: 90 fL (ref 80.0–100.0)
Monocytes Absolute: 0.6 10*3/uL (ref 0.1–1.0)
Monocytes Relative: 10 %
Neutro Abs: 2.9 10*3/uL (ref 1.7–7.7)
Neutrophils Relative %: 52 %
Platelet Count: 286 10*3/uL (ref 150–400)
RBC: 3.9 MIL/uL (ref 3.87–5.11)
RDW: 19 % — ABNORMAL HIGH (ref 11.5–15.5)
WBC Count: 5.6 10*3/uL (ref 4.0–10.5)
nRBC: 0 % (ref 0.0–0.2)

## 2023-10-19 LAB — RETICULOCYTES
Immature Retic Fract: 8.5 % (ref 2.3–15.9)
RBC.: 3.9 MIL/uL (ref 3.87–5.11)
Retic Count, Absolute: 40.6 10*3/uL (ref 19.0–186.0)
Retic Ct Pct: 1 % (ref 0.4–3.1)

## 2023-10-19 LAB — IRON AND IRON BINDING CAPACITY (CC-WL,HP ONLY)
Iron: 127 ug/dL (ref 28–170)
Saturation Ratios: 28 % (ref 10.4–31.8)
TIBC: 449 ug/dL (ref 250–450)
UIBC: 322 ug/dL (ref 148–442)

## 2023-10-19 LAB — FERRITIN: Ferritin: 20 ng/mL (ref 11–307)

## 2023-10-19 NOTE — Progress Notes (Addendum)
 Hematology and Oncology Follow Up Visit  Christine [name] [id number] [birth year] 23 y.o. 10/19/2023   Principle Diagnosis:  Iron deficiency anemia  History of stroke (small PFO) - November 2024  Current Therapy:   IV iron as indicated    Interim History:  Christine [name] is here today for follow-up with her mother. She is doing fairly well.  Still has some fatigue at times.  Hgb is much improved at 11.5, MCV 90, platelets 286 and WBC count 5.6.  She tolerated the last 2 IV iron infusions with premeds nicely.  She is now on a progesterone based birth control [facility]) and has 1 cycle a year which is heavy. She is due to start now and has had some abdominal cramping.  No other blood loss. No abnormal bruising, no petechiae.  She has always had trouble sleeping at night and prefers (and feels better) sleeping during the day.  No issue with infections. No fever, vomiting, cough or changes in bowel or bladder habits.  Dizziness, chest discomfort and palpitations associated with POTS comes and goes.  No falls or syncope reported.  She has a red rash more prominent on the left foot and has been taking benadryl and using a thick lotion for relief. No pitting edema. Pedal pulses are 2+. Negative Homan's sign.  She does have nausea with her cycle.  Appetite and hydration have been good. Weight is stable at 140 lbs.   ECOG Performance Status: 1 - Symptomatic but completely ambulatory  Medications:  Allergies as of 10/19/2023       Reactions   Ferrlecit [na Ferric Gluc Cplx In Sucrose] Hives, Nausea Only, Swelling   Hives, burning to feet,ankles, swelling to hands feet, ankles. All over joint pain, nausea   Escitalopram    Other Reaction(s): Eye Dialations   Flovent Hfa [fluticasone] Diarrhea   Lactose Nausea And Vomiting   Lactose intolerance   Lactose Intolerance (gi) Other (See Comments)   UNSPECIFIED SEVERITY/REACTION   Other Other (See Comments)   PET DANDER SEASONAL  ALLERGIES ASTHMA "TREE NUTS" >> RASH   Pollen Extract Other (See Comments)   UNSPECIFIED REACTION    Sertraline    Other Reaction(s): Increase Depression   Coconut (cocos Nucifera) Rash        Medication List        Accurate as of October 19, 2023 10:01 AM. If you have any questions, ask your nurse or doctor.          albuterol 108 (90 Base) MCG/ACT inhaler Commonly known as: VENTOLIN HFA USE 2 INHALATIONS BY MOUTH  INTO THE LUNGS EVERY 4  HOURS AS NEEDED FOR  WHEEZING   ALPRAZolam 0.25 MG tablet Commonly known as: XANAX Take 0.125 mg by mouth 3 (three) times daily as needed (for anxiety/panic attacks.).   Aspirin Low Dose 81 MG chewable tablet Generic drug: aspirin Chew 1 tablet (81 mg total) by mouth daily.   budesonide-formoterol 160-4.5 MCG/ACT inhaler Commonly known as: Symbicort USE 2 INHALATIONS BY MOUTH TWICE DAILY with spacer and rinse mouth afterwards. What changed:  how much to take how to take this when to take this additional instructions   Carbinoxamine Maleate 4 MG Tabs Take 1 tablet (4 mg total) by mouth every 6 (six) hours as needed. What changed: reasons to take this   cyclobenzaprine 5 MG tablet Commonly known as: FLEXERIL Take 5 mg by mouth as needed (period cramps).   fluticasone 50 MCG/ACT nasal spray Commonly known as: FLONASE Place 1 spray  into both nostrils 2 (two) times daily as needed for allergies or rhinitis. What changed: when to take this   hydrOXYzine 25 MG tablet Commonly known as: ATARAX Take 37.5 mg by mouth daily.   Hyoscyamine Sulfate SL 0.125 MG Subl Take 0.125 mg by mouth 3 (three) times daily as needed.   levothyroxine 75 MCG tablet Commonly known as: SYNTHROID Take 75 mcg by mouth daily.   metoprolol tartrate 25 MG tablet Commonly known as: LOPRESSOR Take 2 hours before CT scan.   montelukast 10 MG tablet Commonly known as: SINGULAIR Take 1 tablet (10 mg total) by mouth at bedtime. What changed: when to  take this   ondansetron 4 MG tablet Commonly known as: ZOFRAN Take 4 mg by mouth as needed for nausea or vomiting.   pantoprazole 40 MG tablet Commonly known as: PROTONIX Take 40 mg by mouth daily.   rosuvastatin 10 MG tablet Commonly known as: CRESTOR Take 1 tablet (10 mg total) by mouth daily.   Slynd 4 MG Tabs Generic drug: Drospirenone Take 1 tablet by mouth daily.   TYLENOL EXTRA STRENGTH PO Take 2 tablets by mouth daily.        Allergies:  Allergies  Allergen Reactions   Ferrlecit [Na Ferric Gluc Cplx In Sucrose] Hives, Nausea Only and Swelling    Hives, burning to feet,ankles, swelling to hands feet, ankles. All over joint pain, nausea   Escitalopram     Other Reaction(s): Eye Dialations   Flovent Hfa [Fluticasone] Diarrhea   Lactose Nausea And Vomiting    Lactose intolerance   Lactose Intolerance (Gi) Other (See Comments)    UNSPECIFIED SEVERITY/REACTION   Other Other (See Comments)    PET DANDER SEASONAL ALLERGIES ASTHMA "TREE NUTS" >> RASH   Pollen Extract Other (See Comments)    UNSPECIFIED REACTION    Sertraline     Other Reaction(s): Increase Depression   Coconut (Cocos Nucifera) Rash    Past Medical History, Surgical history, Social history, and Family History were reviewed and updated.  Review of Systems: All other 10 point review of systems is negative.   Physical Exam:  weight is 140 lb 6.4 oz (63.7 kg). Her oral temperature is 99.1 F (37.3 C). Her blood pressure is 132/75 and her pulse is 109 (abnormal). Her respiration is 17 and oxygen saturation is 100%.   Wt Readings from Last 3 Encounters:  10/19/23 140 lb 6.4 oz (63.7 kg)  08/23/23 127 lb (57.6 kg)  08/20/23 127 lb 8 oz (57.8 kg)    Ocular: Sclerae unicteric, pupils equal, round and reactive to light Ear-nose-throat: Oropharynx clear, dentition fair Lymphatic: No cervical or supraclavicular adenopathy Lungs no rales or rhonchi, good excursion bilaterally Heart regular rate  and rhythm, no murmur appreciated Abd soft, nontender, positive bowel sounds MSK no focal spinal tenderness, no joint edema Neuro: non-focal, well-oriented, appropriate affect Breasts: Deferred   Lab Results  Component Value Date   WBC 5.6 10/19/2023   HGB 11.5 (L) 10/19/2023   HCT 35.1 (L) 10/19/2023   MCV 90.0 10/19/2023   PLT 286 10/19/2023   Lab Results  Component Value Date   FERRITIN 5 (L) 07/26/2023   IRON 33 07/26/2023   TIBC 602 (H) 07/26/2023   UIBC 569 (H) 07/26/2023   IRONPCTSAT 6 (L) 07/26/2023   Lab Results  Component Value Date   RETICCTPCT 1.0 10/19/2023   RBC 3.90 10/19/2023   No results found for: "KPAFRELGTCHN", "LAMBDASER", "KAPLAMBRATIO" No results found for: "IGGSERUM", "IGA", "IGMSERUM" No  results found for: "TOTALPROTELP", "ALBUMINELP", "A1GS", "A2GS", "BETS", "BETA2SER", "GAMS", "MSPIKE", "SPEI"   Chemistry      Component Value Date/Time   NA 138 09/30/2023 0847   K 4.0 09/30/2023 0847   CL 105 09/30/2023 0847   CO2 18 (L) 09/30/2023 0847   BUN 8 09/30/2023 0847   CREATININE 0.75 09/30/2023 0847      Component Value Date/Time   CALCIUM 9.3 09/30/2023 0847   ALKPHOS 79 06/14/2023 2316   AST 19 06/14/2023 2316   ALT 10 06/14/2023 2316   BILITOT 0.4 06/14/2023 2316       Impression and Plan: Ms. Dejoseph is a pleasant 23 yo caucasian female with IDA. She has responded nicely to the IV iron so far.  Iron studies for today are pending. We will replace again if needed.  Follow-up in 3 months.   Eileen Stanford, NP 3/25/202510:01 AM

## 2023-10-25 ENCOUNTER — Encounter: Payer: Self-pay | Admitting: *Deleted

## 2023-11-08 ENCOUNTER — Telehealth: Payer: Self-pay

## 2023-11-08 ENCOUNTER — Telehealth: Payer: Self-pay | Admitting: Cardiology

## 2023-11-08 NOTE — Telephone Encounter (Signed)
 Pt c/o swelling: STAT is pt has developed SOB within 24 hours  How much weight have you gained and in what time span? Not sure   If swelling, where is the swelling located? On both legs in the ankle areas, both are red and one seems to have a rash coming.   Are you currently taking a fluid pill? No  Are you currently SOB? No  Do you have a log of your daily weights (if so, list)? No  Have you gained 3 pounds in a day or 5 pounds in a week? Not sure   Have you traveled recently? No

## 2023-11-08 NOTE — Telephone Encounter (Signed)
 Patient identification verified by 2 forms. [name], RN    Called and spoke to patients Mother Evern Hocking states:   -patient is having swelling in lower legs/ankles   -there is now redness in area that is swollen   -the right leg also has a rash on the swollen area   -if she flexes feet, they feel tight   -no itching in the rash area  -patient has gained 15lbs in 4 months   -unsure if symptoms are related to Crestor  Christine [name] denies:   -SOB/difficulty breathing   -pain in swollen/redness area  Patient scheduled for OV 4/17 at 10:30am  Reviewed ED warning signs/precautions  Christine [name] verbalized understanding, no questions at this time

## 2023-11-08 NOTE — Telephone Encounter (Signed)
 Received phone call from patient mother stating that patient is having bilateral lower leg swelling. Pt mother stated that at patient last visit with Kennard Pea NP they were told to call with any increased swelling. Per mother pt swelling is bilateral and pain is worse with flexing.  Per mom patient also has rash on right lower leg. Reviewed pt symptoms with Kennard Pea NP and per Kennard Pea NP if patient agrees an order can be placed for bilateral ultrasounds to rule out any DVT.  Patient mother stated she wanted to call patient cardiologist before having ultrasounds ordered. Patient mother stated she would call back and let us  know what the cardiologist stated and if order needs to be placed. Patient mother had no other needs and appreciative of call.

## 2023-11-09 NOTE — Progress Notes (Unsigned)
 Cardiology Office Note:    Date:  11/11/2023   ID:  Christine [name], DOB 04/16/2001, MRN [id number]  PCP:  Christine Davenport, MD  Cardiologist:  Christine Ishikawa, MD     Referring MD: Christine Davenport, MD   Chief Complaint: lower extremity edema   History of Present Illness:    Christine [name] is a 23 y.o. 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 Bjorn [name] and presents today for further evaluation of lower extremity edema.  Patient was referred to Dr. Bjorn [name] in 07/2023 after recent admission for CVA.  She was admitted in 05/2023 for acute CVA after presenting with right hemianopsia.  MRI showed acute ischemic infarct in the left medial occipital lobe.  TEE showed PFO.  However, this was discussed with the structural team and it was not felt to be significant.  She was started on Aspirin and Crestor.  Plavix was deferred due to menstrual bleeding.  She was advised to follow-up with her OB/GYN about changing her birth control as she was high risk for recurrent stroke.  At the initial visit with Dr. Gaynelle [name], patient reported palpitations that she described as skipping beats and sometimes racing as well as lightheadedness when standing up-dehydrated.  She also reported lots of issues with nausea and vomiting which often leads to dehydration.  2-week Zio monitor was ordered and showed 1 4 beat run of SVT and rare PACs/PVCs but no significant arrhythmias.  Patient also reported a history of coronary artery fistula.  Echos in 2015 and 2018 at [facility] showed a small coronary cameral fistula that appeared to drain to the main pulmonary artery but normal biventricular systolic function.  Therefore, coronary CTA was  ordered for further evaluation of this and showed coronary calcium score and total plaque volume of 0 with no evidence of CAD and no evidence of fistula.  Patient called our office on 11/08/2023 with concerns about ankle swelling and a rash.  Therefore, this visit was arranged for further evaluation. She is here with her mom.Patient has had some lower extremity swelling and redness of the distal half of both feet since before her stroke. However, she states since her stroke this seems to be getting worse. She used to only have swelling in her left leg but now she is having this in her right leg as well. She also has noticed some intermittent mild redness directly above her ankles. Again she has noticed this on her left leg for a while but last week she started to noticed this on her right leg as well. She states that this will flare intermittently and get bright red. When this happens, the swelling will be worse as well and it will be hard for her to flex her feet because her calves feel tight. She denies any recent travel or surgeries. She is on birth control although her OB/GYN did change her birth control after her stroke. She is currently on Slynd. Per [employer], this still carries a thromboembolic risk.   She denies any chest pain. She has some shortness of breath due to her asthma and it tend to be worse than usual this time of year due to all the pollen. However, she denies anything outside the normal  for her this time of year. No orthopnea or PND. She has dizziness due to POTs that is worse with dehydration but again nothing outside of what is usual for her. No syncope. She does report about a 20 lbs weight gain since before her stroke. She states prior to her stroke, she weighed 115 to 120 lbs at most. Today, she weighed 142 lbs.   She also reports some myalgias with Crestor - specifically worsening pain in her knees that make it difficult for her to bend over.   EKGs/Labs/Other Studies Reviewed:     The following studies were reviewed:  TTE 06/15/2023: 1. Left ventricular ejection fraction, by estimation, is 60 to 65%. The  left ventricle has normal function. The left ventricle has no regional  wall motion abnormalities. Left ventricular diastolic function could not  be evaluated.   2. Right ventricular systolic function is normal. The right ventricular  size is normal. Tricuspid regurgitation signal is inadequate for assessing  PA pressure.   3. The mitral valve is normal in structure. Trivial mitral valve  regurgitation. No evidence of mitral stenosis.   4. The aortic valve is normal in structure. Aortic valve regurgitation is  not visualized. No aortic stenosis is present.   5. The inferior vena cava is normal in size with greater than 50%  respiratory variability, suggesting right atrial pressure of 3 mmHg.   Conclusion(s)/Recommendation(s): No intracardiac source of embolism  detected on this transthoracic study. Consider a transesophageal  echocardiogram to exclude cardiac source of embolism if clinically  indicated.  _______________  TEE 06/16/2023: Impressions: 1. Technically difficult due to tachycardia that developed during the  study   2. Left ventricular ejection fraction, by estimation, is 60 to 65%. The  left ventricle has normal function.   3. Right ventricular systolic function is normal. The right ventricular  size is normal.   4. No left atrial/left atrial appendage thrombus was detected.   5. The mitral valve is normal in structure. No evidence of mitral valve  regurgitation.   6. The aortic valve is tricuspid. Aortic valve regurgitation is not  visualized.   7. Evidence of probable atrial level shunting detected by color flow  Doppler. Agitated saline contrast bubble study was positive, however, it  was not clear if the shunt was in the first 5 cardiac cycles given the  high heart rate. However, the  interatrial septum is aneurysmal and a small  patent foramen ovale with  predominantly right to left shunting across the atrial septum is  suspected.   Conclusion(s)/Recommendation(s): Could consider limited TTE with bubble  when HR is lower to try to confirm atrial level shunting - consider  structural heart team evaluation.  _______________  Monitor 08/29/2023 to 09/10/2023: Patient had a min HR of 48 bpm, max HR of 177 bpm, and avg HR of 92 bpm. Predominant underlying rhythm was Sinus Rhythm. 1 run of Supraventricular Tachycardia occurred lasting 4 beats with a max rate of 143 bpm (avg 136 bpm). Isolated SVEs were rare  (<1.0%), and no SVE Couplets or SVE Triplets were present. Isolated VEs were rare (<1.0%), and no VE Couplets or VE Triplets were present.    No significant arrhythmias  _______________  Coronary CTA 10/10/2023: Impressions: 1. Coronary calcium score of 0. This was 0 percentile for age-, sex, and race-matched controls. 2. Total plaque volume 0 mm3 which is 0 percentile for age- and sex-matched controls (calcified plaque 0 mm3; non-calcified plaque 0 mm3). 3. Normal coronary origin with  right dominance. 4. Normal coronary arteries. 5. Consider non atherosclerotic causes of chest pain  EKG:  EKG ordered today.   EKG Interpretation Date/Time:  Thursday November 11 2023 10:38:55 EDT Ventricular Rate:  112 PR Interval:  116 QRS Duration:  66 QT Interval:  310 QTC Calculation: 423 R Axis:   43  Text Interpretation: Sinus tachycardia  Non-specific ST/ T wave changes (mostly in inferior leads) Confirmed by Marjie Skiff [phone]) on 11/11/2023 10:40:41 AM    Recent Labs: 06/14/2023: ALT 10 09/30/2023: BUN 8; Creatinine, Ser 0.75; Potassium 4.0; Sodium 138 10/19/2023: Hemoglobin 11.5; Platelet Count 286  Recent Lipid Panel    Component Value Date/Time   CHOL 186 06/15/2023 0730   TRIG 147 06/15/2023 0730   HDL 76 06/15/2023 0730   CHOLHDL 2.4 06/15/2023 0730   VLDL 29 06/15/2023 0730   LDLCALC 81 06/15/2023  0730    Physical Exam:    Vital Signs: BP 100/72   Pulse (!) 144   Ht 5\' 5"  (1.651 m)   Wt 142 lb (64.4 kg)   SpO2 98%   BMI 23.63 kg/m     Wt Readings from Last 3 Encounters:  11/11/23 142 lb (64.4 kg)  10/19/23 140 lb 6.4 oz (63.7 kg)  08/23/23 127 lb (57.6 kg)     General: 23 y.o. Caucasian female in no acute distress. HEENT: Normocephalic and atraumatic. Sclera clear.  Neck: Supple. No carotid bruits. No JVD. Heart: Tachycardic with normal rhythm. Distinct S1 and S2. No murmurs, gallops, or rubs.  Lungs: No increased work of breathing. Clear to ausculation bilaterally. No wheezes, rhonchi, or rales.  Abdomen: Soft, non-distended, and non-tender to palpation.  Extremities: Mild non-pitting edema of bilateral lower extremities with erythema of distal part of bilateral feet.  Distal pedal pulses 2+ and equal bilaterally.  Skin: Warm and dry. Neuro: No focal deficits. Psych: Normal affect. Responds appropriately.   Assessment:    1. Lower extremity edema   2. Palpitations   3. POTS (postural orthostatic tachycardia syndrome)   4. Coronary artery fistula   5. History of CVA (cerebrovascular accident)     Plan:    Lower Extremity Edema Patient presents today for further evaluation of lower extremity edema and redness. She has had this for several months (dating back before her stroke) but it seems to be getting worse. Please see HPI for more details.  - Mild non-pitting edema on exam with erythema of distal feet bilateral (including toes).  - Low suspicion for CHF but she does report a 20 lb weight gain over the last 6 months without any big change in diet or activity level. Will check BNP and CMET. Would like to avoid diuretic given BP soft at baseline and she has POTS and is prone to dehydration.  - She denies any recent history of travel or surgery. However, she is on birth control and does have a history of stroke. Will get lower extremities venous dopplers to rule  out DVT.  - Recommend compression stockings if lower extremity dopplers are negative for DVT. Would also typically recommend limiting salt intake but she also has history of POTS which complicates this. - She is concerned that symptoms have gotten worse after starting Crestor. Will hold this for 3-4 weeks to see if this helps.  - If BNP and dopplers are negative and symptoms don't improve with holding Crestor, recommended she follow up with PCP.   Palpitations POTS  Recent monitor in 08/2023 showed one 4 beat runs  of SVT and rare PACs/ PVCs but no significant arrhythmias.  - Heart rate was initially 144 after walking into the office. However, after sitting for several minutes, rates came down to the 110s. - EKG shows sinus tachycardia, rate 112 bpm, with no acute ischemic changes.  - Palpitations and POTS symptoms stable.  - Would hold off any any AV nodal agents at this time given soft BP. - Continue conservative management.  Coronary Artery Fistula She reportedly has a hsitory of coronary artery fistula. Echo at [facility] in 2015 reports a small coronary cameral fistula that appeared to drain to the main pulmonary artery. Recent coronary CTA in 09/2023 showed no evidence of fistula.  History of CVA History of CVA in 05/2023. TEE showed a small PFO but not felt to be significant per structural team. Monitor in 08/2023 showed no evidence of atrial fibrillation.  - Continue Aspirin 81mg  daily.  - Currently on Crestor 10mg  daily. However, she does report some myalgias with this. She is also concerned that her lower extremity edema and erythema may be secondary to this (although symptoms did start before she was started on this). Will hold Crestor for 3-4 weeks to see if she has any improvement. If so, can try another statin. If not, will restart Crestor.   Disposition: Patient already has follow-up with Dr. Alda Amas scheduled for next month. She will keep this visit.    Signed, Casimer Clear, PA-C   11/11/2023 1:43 PM    [facility]

## 2023-11-11 ENCOUNTER — Encounter: Payer: Self-pay | Admitting: Student

## 2023-11-11 ENCOUNTER — Ambulatory Visit (INDEPENDENT_AMBULATORY_CARE_PROVIDER_SITE_OTHER): Admitting: Student

## 2023-11-11 ENCOUNTER — Ambulatory Visit
Admission: RE | Admit: 2023-11-11 | Discharge: 2023-11-11 | Disposition: A | Discharge status: Home / Self Care | Source: Ambulatory Visit | Attending: Student | Admitting: Student

## 2023-11-11 VITALS — BP 100/72 | HR 144 | Ht 65.0 in | Wt 142.0 lb

## 2023-11-11 DIAGNOSIS — Z8673 Personal history of transient ischemic attack (TIA), and cerebral infarction without residual deficits: Secondary | ICD-10-CM

## 2023-11-11 DIAGNOSIS — R600 Localized edema: Secondary | ICD-10-CM

## 2023-11-11 DIAGNOSIS — I2541 Coronary artery aneurysm: Secondary | ICD-10-CM | POA: Insufficient documentation

## 2023-11-11 DIAGNOSIS — R002 Palpitations: Secondary | ICD-10-CM | POA: Insufficient documentation

## 2023-11-11 DIAGNOSIS — G90A Postural orthostatic tachycardia syndrome (POTS): Secondary | ICD-10-CM | POA: Insufficient documentation

## 2023-11-11 NOTE — Patient Instructions (Signed)
 Medication Instructions:  The current medical regimen is effective;  continue present plan and medications as directed. Please refer to the Current Medication list given to you today.  *If you need a refill on your cardiac medications before your next appointment, please call your pharmacy*  Lab Work: BNP AND CMET TODAY If you have labs (blood work) drawn today and your tests are completely normal, you will receive your results only by:  MyChart Message (if you have MyChart) OR A paper copy in the mail If you have any lab test that is abnormal or we need to change your treatment, we will call you to review the results.  Testing/Procedures: LE VENOUS DOPPLER-AT NOON TODAY  Follow-Up: At [facility], you and your health needs are our priority.  As part of our continuing mission to provide you with exceptional heart care, our providers are all part of one team.  This team includes your primary Cardiologist (physician) and Advanced Practice Providers or APPs (Physician Assistants and Nurse Practitioners) who all work together to provide you with the care you need, when you need it.  Your next appointment:   KEEP SCHEDULED APPOINTMENT   Provider:   Wendie Hamburg, MD        1st Floor: - Lobby - Registration  - Pharmacy  - Lab - Cafe  2nd Floor: - PV Lab - Diagnostic Testing (echo, CT, nuclear med)  3rd Floor: - Vacant  4th Floor: - TCTS (cardiothoracic surgery) - AFib Clinic - Structural Heart Clinic - Vascular Surgery  - Vascular Ultrasound  5th Floor: - HeartCare Cardiology (general and EP) - Clinical Pharmacy for coumadin, hypertension, lipid, weight-loss medications, and med management appointments    Valet parking services will be available as well.

## 2023-11-12 LAB — COMPREHENSIVE METABOLIC PANEL WITH GFR
ALT: 18 IU/L (ref 0–32)
AST: 27 IU/L (ref 0–40)
Albumin: 5.1 g/dL — ABNORMAL HIGH (ref 4.0–5.0)
Alkaline Phosphatase: 82 IU/L (ref 44–121)
BUN/Creatinine Ratio: 14 (ref 9–23)
BUN: 11 mg/dL (ref 6–20)
Bilirubin Total: 0.2 mg/dL (ref 0.0–1.2)
CO2: 18 mmol/L — ABNORMAL LOW (ref 20–29)
Calcium: 10.1 mg/dL (ref 8.7–10.2)
Chloride: 105 mmol/L (ref 96–106)
Creatinine, Ser: 0.77 mg/dL (ref 0.57–1.00)
Globulin, Total: 2.7 g/dL (ref 1.5–4.5)
Glucose: 90 mg/dL (ref 70–99)
Potassium: 4 mmol/L (ref 3.5–5.2)
Sodium: 142 mmol/L (ref 134–144)
Total Protein: 7.8 g/dL (ref 6.0–8.5)
eGFR: 110 mL/min/{1.73_m2} (ref >59)

## 2023-11-12 LAB — BRAIN NATRIURETIC PEPTIDE: BNP: 20.2 pg/mL (ref 0.0–100.0)

## 2023-11-20 ENCOUNTER — Encounter (HOSPITAL_BASED_OUTPATIENT_CLINIC_OR_DEPARTMENT_OTHER): Payer: Self-pay | Admitting: Emergency Medicine

## 2023-11-20 ENCOUNTER — Emergency Department (HOSPITAL_BASED_OUTPATIENT_CLINIC_OR_DEPARTMENT_OTHER)
Admission: EM | Admit: 2023-11-20 | Discharge: 2023-11-21 | Disposition: A | Discharge status: Home / Self Care | Attending: Emergency Medicine | Admitting: Emergency Medicine

## 2023-11-20 ENCOUNTER — Other Ambulatory Visit: Payer: Self-pay

## 2023-11-20 DIAGNOSIS — Z7982 Long term (current) use of aspirin: Secondary | ICD-10-CM | POA: Insufficient documentation

## 2023-11-20 DIAGNOSIS — Z79899 Other long term (current) drug therapy: Secondary | ICD-10-CM | POA: Diagnosis not present

## 2023-11-20 DIAGNOSIS — J45909 Unspecified asthma, uncomplicated: Secondary | ICD-10-CM | POA: Diagnosis not present

## 2023-11-20 DIAGNOSIS — T7840XA Allergy, unspecified, initial encounter: Secondary | ICD-10-CM | POA: Diagnosis present

## 2023-11-20 DIAGNOSIS — R110 Nausea: Secondary | ICD-10-CM | POA: Insufficient documentation

## 2023-11-20 MED ORDER — HYOSCYAMINE SULFATE 0.125 MG SL SUBL
0.2500 mg | SUBLINGUAL_TABLET | Freq: Once | SUBLINGUAL | Status: AC
  Administered 2023-11-20: 0.25 mg via SUBLINGUAL
  Filled 2023-11-20: qty 2

## 2023-11-20 MED ORDER — DEXAMETHASONE 4 MG PO TABS
10.0000 mg | ORAL_TABLET | Freq: Once | ORAL | Status: AC
  Administered 2023-11-21: 10 mg via ORAL
  Filled 2023-11-20: qty 3

## 2023-11-20 MED ORDER — ONDANSETRON 4 MG PO TBDP
8.0000 mg | ORAL_TABLET | Freq: Once | ORAL | Status: AC
  Administered 2023-11-21: 8 mg via ORAL
  Filled 2023-11-20: qty 2

## 2023-11-20 MED ORDER — FAMOTIDINE 20 MG PO TABS
20.0000 mg | ORAL_TABLET | Freq: Once | ORAL | Status: AC
  Administered 2023-11-21: 20 mg via ORAL
  Filled 2023-11-20: qty 1

## 2023-11-20 NOTE — ED Notes (Signed)
 ED Provider at bedside.

## 2023-11-20 NOTE — ED Provider Notes (Signed)
  EMERGENCY DEPARTMENT AT [facility] [place] Provider Note   CSN: [id number] Arrival date & time: 11/20/23  2037     History {Add pertinent medical, surgical, social history, OB history to HPI:1} Chief Complaint  Patient presents with   Allergic Reaction    Christine [name] is a 23 y.o. female.  The history is provided by the patient.  Allergic Reaction She has history of asthma, Ehlers-Danlos syndrome, attention deficit disorder, allergies and thinks she had an allergy  to walnuts.  She ate at a restaurant and other dish that walnuts and she thinks was cross-contamination.  On her way home, she started feeling like her tongue and lips were swollen and her throat got numb.  She denies any difficulty breathing or swelling.  There has been some nausea.  She denies any generalized rash.  She did take a dose of diphenhydramine  50 mg.   Home Medications Prior to Admission medications   Medication Sig Start Date End Date Taking? Authorizing Provider  Acetaminophen  (TYLENOL  EXTRA STRENGTH PO) Take 2 tablets by mouth daily.    [provider]  albuterol  (VENTOLIN  HFA) 108 (90 Base) MCG/ACT inhaler USE 2 INHALATIONS BY MOUTH  INTO THE LUNGS EVERY 4  HOURS AS NEEDED FOR  WHEEZING 01/13/21   Trudy Fusi, DO  ALPRAZolam  (XANAX ) 0.25 MG tablet Take 0.125 mg by mouth 3 (three) times daily as needed (for anxiety/panic attacks.).     [provider]  aspirin  81 MG chewable tablet Chew 1 tablet (81 mg total) by mouth daily. 06/16/23   Brayton Calin, MD  budesonide -formoterol  (SYMBICORT ) 160-4.5 MCG/ACT inhaler USE 2 INHALATIONS BY MOUTH TWICE DAILY with spacer and rinse mouth afterwards. Patient taking differently: Inhale 2 puffs into the lungs in the morning and at bedtime. 11/19/20   Trudy Fusi, DO  Carbinoxamine  Maleate 4 MG TABS Take 1 tablet (4 mg total) by mouth every 6 (six) hours as needed. Patient taking differently: Take 4 mg by mouth every 6 (six) hours  as needed (allergies). 11/19/20   Trudy Fusi, DO  cyclobenzaprine (FLEXERIL) 5 MG tablet Take 5 mg by mouth as needed (period cramps). 06/17/23   [provider]  Drospirenone (SLYND) 4 MG TABS Take 1 tablet by mouth daily. 06/22/23   [provider]  fluticasone  (FLONASE ) 50 MCG/ACT nasal spray Place 1 spray into both nostrils 2 (two) times daily as needed for allergies or rhinitis. Patient taking differently: Place 1 spray into both nostrils daily. 11/19/20   Trudy Fusi, DO  hydrOXYzine  (ATARAX /VISTARIL ) 25 MG tablet Take 37.5 mg by mouth daily. 09/20/19   [provider]  Hyoscyamine  Sulfate SL 0.125 MG SUBL Take 0.125 mg by mouth 3 (three) times daily as needed. 09/20/19   [provider]  levothyroxine  (SYNTHROID ) 75 MCG tablet Take 75 mcg by mouth daily. 08/21/19   [provider]  montelukast  (SINGULAIR ) 10 MG tablet Take 1 tablet (10 mg total) by mouth at bedtime. Patient taking differently: Take 10 mg by mouth daily. 10/30/19   Bobbitt, Colen Daunt, MD  ondansetron  (ZOFRAN ) 4 MG tablet Take 4 mg by mouth as needed for nausea or vomiting.    [provider]  pantoprazole  (PROTONIX ) 40 MG tablet Take 40 mg by mouth daily. 08/17/19   [provider]  rosuvastatin  (CRESTOR ) 10 MG tablet Take 1 tablet (10 mg total) by mouth daily. 10/15/23   Wendie Hamburg, MD      Allergies    Ferrlecit [  na ferric gluc cplx in sucrose], Escitalopram , Flovent  hfa [fluticasone ], Lactose, Lactose intolerance (gi), Other, Pollen extract, Sertraline, and Coconut (cocos nucifera)    Review of Systems   Review of Systems  All other systems reviewed and are negative.   Physical Exam Updated Vital Signs BP (!) 145/94 (BP Location: Right Arm)   Pulse (!) 105   Temp 99.1 F (37.3 C)   Resp (!) 22   Ht 5\' 5"  (1.651 m)   Wt 65.3 kg   SpO2 100%   BMI 23.96 kg/m  Physical Exam Vitals and nursing note reviewed.   23 year old female, somewhat  anxious, but is in no acute distress. Vital signs are significant for elevated blood pressure, slightly elevated heart rate, slightly elevated respiratory rate. Oxygen saturation is 100%, which is normal. Head is normocephalic and atraumatic. PERRLA, EOMI. Oropharynx is clear.  There is no swelling of the lips, tongue, sublingual tissues, oropharynx. Neck is nontender and supple without adenopathy. Lungs are clear without rales, wheezes, or rhonchi. Heart has regular rate and rhythm without murmur. Abdomen is soft, flat, nontender. Skin is warm and dry without new rash (she has some mild erythema and swelling of her feet which was present prior to tonight's episode). Neurologic: Awake and alert, moves all extremities equally  ED Results / Procedures / Treatments    Procedures Procedures  {Document cardiac monitor, telemetry assessment procedure when appropriate:1}  Medications Ordered in ED Medications  hyoscyamine  (LEVSIN SL) SL tablet 0.25 mg (0.25 mg Sublingual Given 11/20/23 2334)    ED Course/ Medical Decision Making/ A&P   {   Click here for ABCD2, HEART and other calculatorsREFRESH Note before signing :1}                              Medical Decision Making Risk Prescription drug management.   Allergic reaction to nuts.  I have ordered a dose of famotidine  and dexamethasone.  {Document critical care time when appropriate:1} {Document review of labs and clinical decision tools ie heart score, Chads2Vasc2 etc:1}  {Document your independent review of radiology images, and any outside records:1} {Document your discussion with family members, caretakers, and with consultants:1} {Document social determinants of health affecting pt's care:1} {Document your decision making why or why not admission, treatments were needed:1} Final Clinical Impression(s) / ED Diagnoses Final diagnoses:  None    Rx / DC Orders ED Discharge Orders     None

## 2023-11-20 NOTE — ED Notes (Signed)
 Pt seen in the waiting room for allergic reaction. Pt states she was out to eat and ate chicken. Pt has tree nut allergy  and chicken must have been near that. Endorses taking benadryl  prior to arrival and pain to left side of throat/neck. Hx of asthma. Pt able to speak in complete sentences, with clear bilateral breath sounds and no stridor heard.  SpO2 on room air 97%, HR 127, RR 22. RT will continue to monitor and be available as needed.

## 2023-11-20 NOTE — ED Triage Notes (Signed)
 Pt sts she was exposed to tree nuts at a restaurant; felt like her tongue was tingly; sts hurts to swallow on LT side of throat; pt is anxious; no oral/facial swelling or hives noted; pt has a dry cough; had Benadryl  at 2000

## 2023-11-21 NOTE — Discharge Instructions (Addendum)
 Take diphenhydramine  (Benadryl ) every 4 hours as needed.  Return if you start having difficulty breathing or swallowing.

## 2023-11-21 NOTE — ED Notes (Signed)

## 2023-11-21 NOTE — ED Notes (Signed)
 Pt endorses feeling better and ready for d/c. Message to EDP Candelaria Chaco

## 2023-11-25 ENCOUNTER — Telehealth: Payer: Self-pay

## 2023-11-25 NOTE — Telephone Encounter (Signed)
 Received phone call from patient mother stating that the endocrinologist that patient saw recommended that they call and inquire about whether she had been screened for a clotting disorder.  This RN spoke with Dr. Maria Shiner about the question and he stated it would be beneficial for patient to have labs drawn though usually done by neurology d/t her history of stroke. This RN noted that a hypercoagulable panel was ordered by Dr. Gracie Lav on 09/30/23 but never resulted. This RN called [facility] to follow up on lab and they stated it was never ordered or drawn.  This RN spoke with patient mother and informed her of this. Pt mother educated that Dr. Maria Shiner recommends that she come have the lab drawn here and based on result, move her follow up appointment up from June or keep as scheduled. Pt mother stated that she wanted to see how long it would be to get in to see the new neurologist and have that office draw the lab. Pt mother educated that if at any time she wants this office to draw the lab we can order it and have her added to the schedule.  Pt mother appreciative of help and call and stated she would call back to let us  know how she would like to proceed.

## 2023-12-02 ENCOUNTER — Other Ambulatory Visit (HOSPITAL_COMMUNITY): Payer: Self-pay

## 2023-12-02 ENCOUNTER — Ambulatory Visit: Payer: 59 | Attending: Cardiology | Admitting: Cardiology

## 2023-12-02 VITALS — BP 126/78 | HR 59 | Ht 65.0 in | Wt 148.4 lb

## 2023-12-02 DIAGNOSIS — I639 Cerebral infarction, unspecified: Secondary | ICD-10-CM | POA: Diagnosis not present

## 2023-12-02 DIAGNOSIS — E785 Hyperlipidemia, unspecified: Secondary | ICD-10-CM

## 2023-12-02 DIAGNOSIS — I2541 Coronary artery aneurysm: Secondary | ICD-10-CM | POA: Diagnosis not present

## 2023-12-02 MED ORDER — ATORVASTATIN CALCIUM 10 MG PO TABS
10.0000 mg | ORAL_TABLET | Freq: Every day | ORAL | 3 refills | Status: DC
  Filled 2023-12-02: qty 30, 30d supply, fill #0

## 2023-12-02 MED ORDER — ATORVASTATIN CALCIUM 10 MG PO TABS: 10.0000 mg | ORAL_TABLET | Freq: Every day | ORAL | 3 refills | Status: AC

## 2023-12-02 NOTE — Patient Instructions (Signed)
 Medication Instructions:  Stop Rosuvastatin  as discussed with your provider Start Atorvastatin 10 mg daily *If you need a refill on your cardiac medications before your next appointment, please call your pharmacy*  Lab Work: Fasting lipid panel in 3 months If you have labs (blood work) drawn today and your tests are completely normal, you will receive your results only by: MyChart Message (if you have MyChart) OR A paper copy in the mail If you have any lab test that is abnormal or we need to change your treatment, we will call you to review the results.  Testing/Procedures: none  Follow-Up: At [facility], you and your health needs are our priority.  As part of our continuing mission to provide you with exceptional heart care, our providers are all part of one team.  This team includes your primary Cardiologist (physician) and Advanced Practice Providers or APPs (Physician Assistants and Nurse Practitioners) who all work together to provide you with the care you need, when you need it.  Your next appointment:   6 month(s)  Provider:   Wendie Hamburg, MD    We recommend signing up for the patient portal called "MyChart".  Sign up information is provided on this After Visit Summary.  MyChart is used to connect with patients for Virtual Visits (Telemedicine).  Patients are able to view lab/test results, encounter notes, upcoming appointments, etc.  Non-urgent messages can be sent to your provider as well.   To learn more about what you can do with MyChart, go to [email or web address].   Other Instructions none

## 2023-12-02 NOTE — Progress Notes (Signed)
 Cardiology Office Note:    Date:  12/02/2023   ID:  Christine [name], DOB [birth year], MRN [id number]  PCP:  Allene Ivan, MD  Cardiologist:  Wendie Hamburg, MD  Electrophysiologist:  None   Referring MD: Allene Ivan, MD   Chief Complaint  Patient presents with   Edema    History of Present Illness:    Christine [name] is a 23 y.o. female with a hx of CVA, Ehlers-Danlos syndrome, coronary artery fistula, hypothyroidism, iron  deficiency anemia, POTS who presents for follow-up.  She was referred by Dr. Kurt Phi for evaluation of CVA, initially seen 08/23/2023.  She was admitted 05/2023 with acute CVA.  She presented with right hemianopsia.  MRI showed acute ischemic infarct in the left medial occipital lobe.  TEE showed PFO that was discussed with structural team and not felt to be significant.  She was started on aspirin  81 mg daily.  It was recommended that she discuss with her OB/GYN changing her birth control as she was high risk for recurrent stroke.  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  E[facility]ocardiogram at [facility] in 2015 comments on small coronary cameral fistula, appears to drain to main pulmonary artery.  Zio patch x 12 days 08/2023 shows no significant arrhythmias.  Coronary CTA 10/06/2023 showed normal coronary arteries, no fistula seen.  Since last clinic visit, she reports the swelling and redness in her legs is improving, was previously down her entire legs but now just in feet.  Denies any chest pain, dyspnea, lightheadedness, syncope.  Reports palpitations have improved.   Past Medical History:  Diagnosis Date   ADHD    Allergy     Anemia    reports history of slight anemia   Anxiety    Asthma    Coronary artery fistula    by echo per note 12/23/16 at [facility]   Depression    Ehlers-Danlos syndrome    diagnosed x63month ago   GERD (gastroesophageal reflux disease)    H. pylori infection    Heart murmur     Hyperprolactinemia (HCC)    Hypothyroidism    Iron  deficiency anemia due to chronic blood loss 07/27/2023   Menorrhagia 07/27/2023   Other atopic dermatitis 11/19/2020   POTS (postural orthostatic tachycardia syndrome)    Urticaria    Vision abnormalities     Past Surgical History:  Procedure Laterality Date   HYMENECTOMY     MOUTH SURGERY     TRANSESOPHAGEAL ECHOCARDIOGRAM (CATH LAB) N/A 06/16/2023   Procedure: TRANSESOPHAGEAL ECHOCARDIOGRAM;  Surgeon: Hazle Lites, MD;  Location: [facility] INVASIVE CV LAB;  Service: Cardiovascular;  Laterality: N/A;    Current Medications: Current Meds  Medication Sig   Acetaminophen  (TYLENOL  EXTRA STRENGTH PO) Take 2 tablets by mouth daily.   albuterol  (VENTOLIN  HFA) 108 (90 Base) MCG/ACT inhaler USE 2 INHALATIONS BY MOUTH  INTO THE LUNGS EVERY 4  HOURS AS NEEDED FOR  WHEEZING   ALPRAZolam  (XANAX ) 0.25 MG tablet Take 0.125 mg by mouth 3 (three) times daily as needed (for anxiety/panic attacks.).    aspirin  81 MG chewable tablet Chew 1 tablet (81 mg total) by mouth daily.   atorvastatin (LIPITOR) 10 MG tablet Take 1 tablet (10 mg total) by mouth daily.   budesonide -formoterol  (SYMBICORT ) 160-4.5 MCG/ACT inhaler USE 2 INHALATIONS BY MOUTH TWICE DAILY with spacer and rinse mouth afterwards. (Patient taking differently: Inhale 2 puffs into the lungs in the morning and at bedtime.)   Carbinoxamine  Maleate  4 MG TABS Take 1 tablet (4 mg total) by mouth every 6 (six) hours as needed. (Patient taking differently: Take 4 mg by mouth every 6 (six) hours as needed (allergies).)   cyclobenzaprine (FLEXERIL) 5 MG tablet Take 5 mg by mouth as needed (period cramps).   Drospirenone (SLYND) 4 MG TABS Take 1 tablet by mouth daily.   fluticasone  (FLONASE ) 50 MCG/ACT nasal spray Place 1 spray into both nostrils 2 (two) times daily as needed for allergies or rhinitis. (Patient taking differently: Place 1 spray into both nostrils daily.)   hydrOXYzine  (ATARAX /VISTARIL ) 25  MG tablet Take 37.5 mg by mouth daily.   Hyoscyamine  Sulfate SL 0.125 MG SUBL Take 0.125 mg by mouth 3 (three) times daily as needed.   levothyroxine  (SYNTHROID ) 75 MCG tablet Take 75 mcg by mouth daily.   montelukast  (SINGULAIR ) 10 MG tablet Take 1 tablet (10 mg total) by mouth at bedtime. (Patient taking differently: Take 10 mg by mouth daily.)   ondansetron  (ZOFRAN ) 4 MG tablet Take 4 mg by mouth as needed for nausea or vomiting.   pantoprazole  (PROTONIX ) 40 MG tablet Take 40 mg by mouth daily.   [DISCONTINUED] atorvastatin (LIPITOR) 10 MG tablet Take 1 tablet (10 mg total) by mouth daily.     Allergies:   Ferrlecit [na ferric gluc cplx in sucrose], Escitalopram , Flovent  hfa [fluticasone ], Lactose, Lactose intolerance (gi), Other, Pollen extract, Sertraline, and Coconut (cocos nucifera)   Social History   Socioeconomic History   Marital status: Single    Spouse name: Not on file   Number of children: Not on file   Years of education: Not on file   Highest education level: Not on file  Occupational History   Not on file  Tobacco Use   Smoking status: Never   Smokeless tobacco: Never  Vaping Use   Vaping status: Never Used  Substance and Sexual Activity   Alcohol use: No    Alcohol/week: 0.0 standard drinks of alcohol   Drug use: No   Sexual activity: Never    Birth control/protection: Abstinence    Comment: insurance questions declined  Other Topics Concern   Not on file  Social History Narrative   Caffiene 1-2 daily avg.   Working no   Lives mom, 2 cats   Social Drivers of Corporate investment banker Strain: Not on file  Food Insecurity: Not on file  Transportation Needs: Not on file  Physical Activity: Not on file  Stress: Not on file  Social Connections: Not on file     Family History: The patient's family history includes Bipolar disorder in her maternal uncle. There is no history of Allergic rhinitis, Asthma, Eczema, or Urticaria. She was adopted.  ROS:    Please see the history of present illness.     All other systems reviewed and are negative.  EKGs/Labs/Other Studies Reviewed:    The following studies were reviewed today:   EKG:   08/23/2023: Sinus rhythm, rate 87  Recent Labs: 10/19/2023: Hemoglobin 11.5; Platelet Count 286 11/11/2023: ALT 18; BNP 20.2; BUN 11; Creatinine, Ser 0.77; Potassium 4.0; Sodium 142  Recent Lipid Panel    Component Value Date/Time   CHOL 186 06/15/2023 0730   TRIG 147 06/15/2023 0730   HDL 76 06/15/2023 0730   CHOLHDL 2.4 06/15/2023 0730   VLDL 29 06/15/2023 0730   LDLCALC 81 06/15/2023 0730    Physical Exam:    VS:  BP 126/78 (BP Location: Right Arm, Patient Position: Sitting, Cuff  Size: Normal)   Pulse (!) 59   Ht 5\' 5"  (1.651 m)   Wt 148 lb 6.4 oz (67.3 kg)   SpO2 97%   BMI 24.70 kg/m     Wt Readings from Last 3 Encounters:  12/02/23 148 lb 6.4 oz (67.3 kg)  11/20/23 144 lb (65.3 kg)  11/11/23 142 lb (64.4 kg)     GEN:  Well nourished, well developed in no acute distress HEENT: Normal NECK: No JVD; No carotid bruits CARDIAC: RRR, no murmurs, rubs, gallops RESPIRATORY:  Clear to auscultation without rales, wheezing or rhonchi  ABDOMEN: Soft, non-tender, non-distended MUSCULOSKELETAL: Nonpitting edema, redness in both feet SKIN: Warm and dry NEUROLOGIC:  Alert and oriented x 3 PSYCHIATRIC:  Normal affect   ASSESSMENT:    1. Cerebrovascular accident (CVA), unspecified mechanism (HCC)   2. Hyperlipidemia, unspecified hyperlipidemia type   3. Coronary artery fistula     PLAN:    CVA: She was admitted 05/2023 with acute CVA.  She presented with right hemianopsia.  MRI showed acute ischemic infarct in the left medial occipital lobe.  TEE showed small PFO that was discussed with structural team and not felt to be significant.  She was started on aspirin  81 mg daily.  It was recommended that she discuss with her OB/GYN changing her birth control as she was high risk for recurrent  stroke.   Zio patch x 12 days 08/2023 shows no significant arrhythmias.   -Continue aspirin  -Was started on rosuvastatin  10 mg daily but held over the last 3 weeks and is concerned that could be contributing to her edema and muscle aches.  Does report improvement since stopping.  Will trial low-dose atorvastatin 10 mg daily, if similar symptoms we will need to avoid statins and will refer to pharmacy lipid clinic for alternative  ?Coronary artery fistula: She has a history of reported coronary artery fistula.  [facility]ogram at [facility] in [facility] comments on small coronary cameral fistula, appears to drain to main pulmonary artery. Coronary CTA 10/06/2023 showed normal coronary arteries, no fistula seen.  Lower extremity edema: Reports improvement, nonpitting edema on exam today.  No DVT on duplex 10/2023.  BNP 20, unlikely cardiac etiology.  Albumin 5.1.  She is on levothyroxine  and recently saw her endocrinologist and reports TSH OK.  Compression stockings recommended  RTC in 6 months   Medication Adjustments/Labs and Tests Ordered: Current medicines are reviewed at length with the patient today.  Concerns regarding medicines are outlined above.  Orders Placed This Encounter  Procedures   Lipid panel   Meds ordered this encounter  Medications   DISCONTD: atorvastatin (LIPITOR) 10 MG tablet    Sig: Take 1 tablet (10 mg total) by mouth daily.    Dispense:  90 tablet    Refill:  3   atorvastatin (LIPITOR) 10 MG tablet    Sig: Take 1 tablet (10 mg total) by mouth daily.    Dispense:  90 tablet    Refill:  3    Patient Instructions  Medication Instructions:  Stop Rosuvastatin  as discussed with your provider Start Atorvastatin 10 mg daily *If you need a refill on your cardiac medications before your next appointment, please call your pharmacy*  Lab Work: Fasting lipid panel in 3 months If you have labs (blood work) drawn today and your tests are completely normal, you will receive your  results only by: MyChart Message (if you have MyChart) OR A paper copy in the mail If you have any lab test  that is abnormal or we need to change your treatment, we will call you to review the results.  Testing/Procedures: none  Follow-Up: At [facility], you and your health needs are our priority.  As part of our continuing mission to provide you with exceptional heart care, our providers are all part of one team.  This team includes your primary Cardiologist (physician) and Advanced Practice Providers or APPs (Physician Assistants and Nurse Practitioners) who all work together to provide you with the care you need, when you need it.  Your next appointment:   6 month(s)  Provider:   Wendie Hamburg, MD    We recommend signing up for the patient portal called "MyChart".  Sign up information is provided on this After Visit Summary.  MyChart is used to connect with patients for Virtual Visits (Telemedicine).  Patients are able to view lab/test results, encounter notes, upcoming appointments, etc.  Non-urgent messages can be sent to your provider as well.   To learn more about what you can do with MyChart, go to [email or web address].   Other Instructions none       Signed, Wendie Hamburg, MD  12/02/2023 9:16 AM    [facility]

## 2024-01-19 ENCOUNTER — Encounter: Payer: Self-pay | Admitting: Family

## 2024-01-19 ENCOUNTER — Inpatient Hospital Stay: Attending: Hematology & Oncology

## 2024-01-19 ENCOUNTER — Encounter: Payer: Self-pay | Admitting: *Deleted

## 2024-01-19 ENCOUNTER — Inpatient Hospital Stay (HOSPITAL_BASED_OUTPATIENT_CLINIC_OR_DEPARTMENT_OTHER): Admitting: Family

## 2024-01-19 VITALS — BP 122/83 | HR 86 | Temp 99.0°F | Resp 17 | Wt 153.8 lb

## 2024-01-19 DIAGNOSIS — D509 Iron deficiency anemia, unspecified: Secondary | ICD-10-CM | POA: Insufficient documentation

## 2024-01-19 DIAGNOSIS — D500 Iron deficiency anemia secondary to blood loss (chronic): Secondary | ICD-10-CM

## 2024-01-19 DIAGNOSIS — Z8673 Personal history of transient ischemic attack (TIA), and cerebral infarction without residual deficits: Secondary | ICD-10-CM | POA: Insufficient documentation

## 2024-01-19 DIAGNOSIS — N921 Excessive and frequent menstruation with irregular cycle: Secondary | ICD-10-CM

## 2024-01-19 LAB — IRON AND IRON BINDING CAPACITY (CC-WL,HP ONLY)
Iron: 101 ug/dL (ref 28–170)
Saturation Ratios: 25 % (ref 10.4–31.8)
TIBC: 409 ug/dL (ref 250–450)
UIBC: 308 ug/dL (ref 148–442)

## 2024-01-19 LAB — CBC WITH DIFFERENTIAL (CANCER CENTER ONLY)
Abs Immature Granulocytes: 0.06 10*3/uL (ref 0.00–0.07)
Basophils Absolute: 0.1 10*3/uL (ref 0.0–0.1)
Basophils Relative: 1 %
Eosinophils Absolute: 0.3 10*3/uL (ref 0.0–0.5)
Eosinophils Relative: 3 %
HCT: 35.7 % — ABNORMAL LOW (ref 36.0–46.0)
Hemoglobin: 11.8 g/dL — ABNORMAL LOW (ref 12.0–15.0)
Immature Granulocytes: 1 %
Lymphocytes Relative: 39 %
Lymphs Abs: 3.9 10*3/uL (ref 0.7–4.0)
MCH: 30.3 pg (ref 26.0–34.0)
MCHC: 33.1 g/dL (ref 30.0–36.0)
MCV: 91.5 fL (ref 80.0–100.0)
Monocytes Absolute: 0.9 10*3/uL (ref 0.1–1.0)
Monocytes Relative: 10 %
Neutro Abs: 4.5 10*3/uL (ref 1.7–7.7)
Neutrophils Relative %: 46 %
Platelet Count: 342 10*3/uL (ref 150–400)
RBC: 3.9 MIL/uL (ref 3.87–5.11)
RDW: 13.2 % (ref 11.5–15.5)
WBC Count: 9.8 10*3/uL (ref 4.0–10.5)
nRBC: 0 % (ref 0.0–0.2)

## 2024-01-19 LAB — RETICULOCYTES
Immature Retic Fract: 6.7 % (ref 2.3–15.9)
RBC.: 3.95 MIL/uL (ref 3.87–5.11)
Retic Count, Absolute: 48.6 10*3/uL (ref 19.0–186.0)
Retic Ct Pct: 1.2 % (ref 0.4–3.1)

## 2024-01-19 LAB — FERRITIN: Ferritin: 36 ng/mL (ref 11–307)

## 2024-01-19 NOTE — Progress Notes (Signed)
 Hematology and Oncology Follow Up Visit  Massa Pe [id number] 02-Mar-2001 23 y.o. 01/19/2024   Principle Diagnosis:  Iron  deficiency anemia  History of stroke (small PFO) - November 2024   Current Therapy:        IV iron  as indicated    Interim History:  Ms. Schmuck is here today with her mother for follow-up. She has some fatigue at times and states that she has had loose diarrhea for the last 2 weeks despite only eating rice.  No blood loss noted. No bruising or petechiae.  She started Atorvastatin  a month ago which has been the only medication change.  No fever, chills, cough, chest pain or changes in bladder habits.  She has the occasional SOB with asthma, palpitations with exertion and nausea.  Taking the D3 supplement causes n/v.  She has improved swelling in the feet and ankles and some redness in the toes and top part of her feet exacerbated by the pressure of her shoes.  No numbness or tingling in her extremities at this time.  No falls or syncope reported.  Appetite and hydration are stable. Weight is 153 lbs (prev 145 lbs).   ECOG Performance Status: 1 - Symptomatic but completely ambulatory  Medications:  Allergies as of 01/19/2024       Reactions   Ferrlecit [na Ferric Gluc Cplx In Sucrose] Hives, Nausea Only, Swelling   Hives, burning to feet,ankles, swelling to hands feet, ankles. All over joint pain, nausea   Escitalopram     Other Reaction(s): Eye Dialations   Flovent  Hfa [fluticasone ] Diarrhea   Lactose Nausea And Vomiting   Lactose intolerance   Lactose Intolerance (gi) Other (See Comments)   UNSPECIFIED SEVERITY/REACTION   Other Other (See Comments)   PET DANDER SEASONAL ALLERGIES ASTHMA TREE NUTS >> RASH   Pollen Extract Other (See Comments)   UNSPECIFIED REACTION    Sertraline    Other Reaction(s): Increase Depression   Coconut (cocos Nucifera) Rash        Medication List        Accurate as of January 19, 2024  8:48 AM. If you  have any questions, ask your nurse or doctor.          albuterol  108 (90 Base) MCG/ACT inhaler Commonly known as: VENTOLIN  HFA USE 2 INHALATIONS BY MOUTH  INTO THE LUNGS EVERY 4  HOURS AS NEEDED FOR  WHEEZING   ALPRAZolam  0.25 MG tablet Commonly known as: XANAX  Take 0.125 mg by mouth 3 (three) times daily as needed (for anxiety/panic attacks.).   Aspirin  Low Dose 81 MG chewable tablet Generic drug: aspirin  Chew 1 tablet (81 mg total) by mouth daily.   atorvastatin  10 MG tablet Commonly known as: LIPITOR Take 1 tablet (10 mg total) by mouth daily.   budesonide -formoterol  160-4.5 MCG/ACT inhaler Commonly known as: Symbicort  USE 2 INHALATIONS BY MOUTH TWICE DAILY with spacer and rinse mouth afterwards. What changed:  how much to take how to take this when to take this additional instructions   Carbinoxamine  Maleate 4 MG Tabs Take 1 tablet (4 mg total) by mouth every 6 (six) hours as needed. What changed: reasons to take this   cyclobenzaprine 5 MG tablet Commonly known as: FLEXERIL Take 5 mg by mouth as needed (period cramps).   fluticasone  50 MCG/ACT nasal spray Commonly known as: FLONASE  Place 1 spray into both nostrils 2 (two) times daily as needed for allergies or rhinitis. What changed: when to take this   hydrOXYzine  25 MG tablet  Commonly known as: ATARAX  Take 37.5 mg by mouth daily.   Hyoscyamine  Sulfate SL 0.125 MG Subl Take 0.125 mg by mouth 3 (three) times daily as needed.   levothyroxine  75 MCG tablet Commonly known as: SYNTHROID  Take 75 mcg by mouth daily.   montelukast  10 MG tablet Commonly known as: SINGULAIR  Take 1 tablet (10 mg total) by mouth at bedtime. What changed: when to take this   ondansetron  4 MG tablet Commonly known as: ZOFRAN  Take 4 mg by mouth as needed for nausea or vomiting.   pantoprazole  40 MG tablet Commonly known as: PROTONIX  Take 40 mg by mouth daily.   Slynd 4 MG Tabs Generic drug: Drospirenone Take 1 tablet by  mouth daily.   TYLENOL  EXTRA STRENGTH PO Take 2 tablets by mouth daily.        Allergies:  Allergies  Allergen Reactions   Ferrlecit [Na Ferric Gluc Cplx In Sucrose] Hives, Nausea Only and Swelling    Hives, burning to feet,ankles, swelling to hands feet, ankles. All over joint pain, nausea   Escitalopram      Other Reaction(s): Eye Dialations   Flovent  Hfa [Fluticasone ] Diarrhea   Lactose Nausea And Vomiting    Lactose intolerance   Lactose Intolerance (Gi) Other (See Comments)    UNSPECIFIED SEVERITY/REACTION   Other Other (See Comments)    PET DANDER SEASONAL ALLERGIES ASTHMA TREE NUTS >> RASH   Pollen Extract Other (See Comments)    UNSPECIFIED REACTION    Sertraline     Other Reaction(s): Increase Depression   Coconut (Cocos Nucifera) Rash    Past Medical History, Surgical history, Social history, and Family History were reviewed and updated.  Review of Systems: All other 10 point review of systems is negative.   Physical Exam:  weight is 153 lb 12.8 oz (69.8 kg). Her oral temperature is 99 F (37.2 C). Her blood pressure is 122/83 and her pulse is 86. Her respiration is 17 and oxygen saturation is 100%.   Wt Readings from Last 3 Encounters:  01/19/24 153 lb 12.8 oz (69.8 kg)  12/02/23 148 lb 6.4 oz (67.3 kg)  11/20/23 144 lb (65.3 kg)    Ocular: Sclerae unicteric, pupils equal, round and reactive to light Ear-nose-throat: Oropharynx clear, dentition fair Lymphatic: No cervical or supraclavicular adenopathy Lungs no rales or rhonchi, good excursion bilaterally Heart regular rate and rhythm, no murmur appreciated Abd soft, nontender, positive bowel sounds MSK no focal spinal tenderness, no joint edema Neuro: non-focal, well-oriented, appropriate affect Breasts: Deferred   Lab Results  Component Value Date   WBC 9.8 01/19/2024   HGB 11.8 (L) 01/19/2024   HCT 35.7 (L) 01/19/2024   MCV 91.5 01/19/2024   PLT 342 01/19/2024   Lab Results  Component  Value Date   FERRITIN 20 10/19/2023   IRON  127 10/19/2023   TIBC 449 10/19/2023   UIBC 322 10/19/2023   IRONPCTSAT 28 10/19/2023   Lab Results  Component Value Date   RETICCTPCT 1.2 01/19/2024   RBC 3.90 01/19/2024   RBC 3.95 01/19/2024   No results found for: KPAFRELGTCHN, LAMBDASER, KAPLAMBRATIO No results found for: IGGSERUM, IGA, IGMSERUM No results found for: STEPHANY RINGS, A1GS, A2GS, BETS, BETA2SER, GAMS, MSPIKE, SPEI   Chemistry      Component Value Date/Time   NA 142 11/11/2023 1255   K 4.0 11/11/2023 1255   CL 105 11/11/2023 1255   CO2 18 (L) 11/11/2023 1255   BUN 11 11/11/2023 1255   CREATININE 0.77 11/11/2023 1255  Component Value Date/Time   CALCIUM  10.1 11/11/2023 1255   ALKPHOS 82 11/11/2023 1255   AST 27 11/11/2023 1255   ALT 18 11/11/2023 1255   BILITOT 0.2 11/11/2023 1255       Impression and Plan: Ms. Kumari is a pleasant 23 yo caucasian female with IDA. She has responded nicely to the IV iron  so far. Hgb remains stable at 11.8, MCV 91, platelets 342 and WBC count 9.8.  Iron  studies for today are pending. We will replace again if needed.  Follow-up in 3 months.   Lauraine Pepper, NP 6/25/20258:48 AM

## 2024-02-02 ENCOUNTER — Telehealth: Payer: Self-pay | Admitting: Neurology

## 2024-02-02 NOTE — Telephone Encounter (Signed)
 ERROR

## 2024-02-09 ENCOUNTER — Encounter: Payer: Self-pay | Admitting: Cardiology

## 2024-02-09 NOTE — Telephone Encounter (Signed)
 Agree with plan

## 2024-02-24 ENCOUNTER — Ambulatory Visit: Payer: 59 | Admitting: Neurology

## 2024-02-29 ENCOUNTER — Encounter (HOSPITAL_BASED_OUTPATIENT_CLINIC_OR_DEPARTMENT_OTHER): Payer: Self-pay

## 2024-02-29 ENCOUNTER — Emergency Department (HOSPITAL_BASED_OUTPATIENT_CLINIC_OR_DEPARTMENT_OTHER)
Admission: EM | Admit: 2024-02-29 | Discharge: 2024-02-29 | Disposition: A | Discharge status: Home / Self Care | Attending: Emergency Medicine | Admitting: Emergency Medicine

## 2024-02-29 ENCOUNTER — Other Ambulatory Visit: Payer: Self-pay

## 2024-02-29 ENCOUNTER — Emergency Department (HOSPITAL_COMMUNITY)

## 2024-02-29 ENCOUNTER — Emergency Department (HOSPITAL_BASED_OUTPATIENT_CLINIC_OR_DEPARTMENT_OTHER)

## 2024-02-29 DIAGNOSIS — Z7982 Long term (current) use of aspirin: Secondary | ICD-10-CM | POA: Insufficient documentation

## 2024-02-29 DIAGNOSIS — R200 Anesthesia of skin: Secondary | ICD-10-CM | POA: Insufficient documentation

## 2024-02-29 DIAGNOSIS — R0789 Other chest pain: Secondary | ICD-10-CM | POA: Insufficient documentation

## 2024-02-29 DIAGNOSIS — E039 Hypothyroidism, unspecified: Secondary | ICD-10-CM | POA: Diagnosis not present

## 2024-02-29 DIAGNOSIS — J45909 Unspecified asthma, uncomplicated: Secondary | ICD-10-CM | POA: Diagnosis not present

## 2024-02-29 LAB — BASIC METABOLIC PANEL WITH GFR
Anion gap: 17 — ABNORMAL HIGH (ref 5–15)
BUN: 18 mg/dL (ref 6–20)
CO2: 20 mmol/L — ABNORMAL LOW (ref 22–32)
Calcium: 10.2 mg/dL (ref 8.9–10.3)
Chloride: 103 mmol/L (ref 98–111)
Creatinine, Ser: 0.93 mg/dL (ref 0.44–1.00)
GFR, Estimated: >60 mL/min (ref >60)
Glucose, Bld: 112 mg/dL — ABNORMAL HIGH (ref 70–99)
Potassium: 3.8 mmol/L (ref 3.5–5.1)
Sodium: 140 mmol/L (ref 135–145)

## 2024-02-29 LAB — CBC
HCT: 37.3 % (ref 36.0–46.0)
Hemoglobin: 12.5 g/dL (ref 12.0–15.0)
MCH: 30.4 pg (ref 26.0–34.0)
MCHC: 33.5 g/dL (ref 30.0–36.0)
MCV: 90.8 fL (ref 80.0–100.0)
Platelets: 337 K/uL (ref 150–400)
RBC: 4.11 MIL/uL (ref 3.87–5.11)
RDW: 12.7 % (ref 11.5–15.5)
WBC: 9.8 K/uL (ref 4.0–10.5)
nRBC: 0 % (ref 0.0–0.2)

## 2024-02-29 LAB — TROPONIN T, HIGH SENSITIVITY
Troponin T High Sensitivity: <15 ng/L (ref <19)
Troponin T High Sensitivity: <15 ng/L (ref <19)

## 2024-02-29 LAB — D-DIMER, QUANTITATIVE: D-Dimer, Quant: 0.3 ug{FEU}/mL (ref 0.00–0.50)

## 2024-02-29 LAB — LIPASE, BLOOD: Lipase: 24 U/L (ref 11–51)

## 2024-02-29 LAB — HCG, QUANTITATIVE, PREGNANCY: hCG, Beta Chain, Quant, S: <1 m[IU]/mL (ref <5)

## 2024-02-29 MED ORDER — DIAZEPAM 5 MG PO TABS: 5.0000 mg | ORAL_TABLET | Freq: Once | ORAL | Status: DC | PRN

## 2024-02-29 MED ORDER — DIAZEPAM 5 MG PO TABS
10.0000 mg | ORAL_TABLET | Freq: Once | ORAL | Status: AC | PRN
Start: 2024-02-29 — End: 2024-02-29
  Administered 2024-02-29: 10 mg via ORAL
  Filled 2024-02-29: qty 2

## 2024-02-29 MED ORDER — LORAZEPAM 2 MG/ML IJ SOLN: 1.0000 mg | Freq: Once | INTRAMUSCULAR | Status: DC

## 2024-02-29 MED ORDER — MIDAZOLAM HCL 2 MG/2ML IJ SOLN
2.0000 mg | Freq: Once | INTRAMUSCULAR | Status: AC
  Administered 2024-02-29: 2 mg via INTRAVENOUS
  Filled 2024-02-29: qty 2

## 2024-02-29 NOTE — ED Notes (Signed)
 Save blue top sent to the lab

## 2024-02-29 NOTE — ED Provider Notes (Signed)
 [facility] Provider Note  CSN: [id number] Arrival date & time: 02/29/24 9362  Chief Complaint(s) Chest Pain  HPI Christine [name] is a 23 y.o. 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  Patient reports around 5 AM she felt chest tightness, cramping sensation.  She went to go take a shower and felt nauseated.  She also began having heaviness to her face.  Feels her right cheek is less sensation in her left cheek.  No changes to her baseline vision loss from prior CVA.  She also has right side lower extremity sensation loss from her prior stroke which is unchanged today. She is compliant with her typical medications.  No new vision changes, no weakness to her extremities, no recent head injuries.  She has chest tightness, unable to identify alleviating or exacerbating factors, not reproduced with palpation.  No associated dyspnea.  Pain has been constant since the onset.  Past Medical History Past Medical History:  Diagnosis Date   ADHD    Allergy     Anemia    reports history of slight anemia   Anxiety    Asthma    Coronary artery fistula    by echo per note 12/23/16 at [facility]   Depression    Ehlers-Danlos syndrome    diagnosed x74month ago   GERD (gastroesophageal reflux disease)    H. pylori infection    Heart murmur    Hyperprolactinemia (HCC)    Hypothyroidism    Iron  deficiency anemia due to chronic blood loss 07/27/2023   Menorrhagia 07/27/2023   Other atopic dermatitis 11/19/2020   POTS (postural orthostatic tachycardia syndrome)    Urticaria    Vision abnormalities    Patient Active Problem List   Diagnosis Date Noted   Iron   deficiency anemia due to chronic blood loss 07/27/2023   Menorrhagia 07/27/2023   Hashimoto's thyroiditis 07/26/2023   Other specified abnormal immunological findings in serum 07/26/2023   Vitamin D deficiency 07/26/2023   Cerebrovascular accident (HCC) 06/17/2023   Occipital infarction (HCC) 06/15/2023   Other atopic dermatitis 11/19/2020   Impacted cerumen of right ear 10/15/2020   Anemia 10/09/2020   Hypothyroidism 04/21/2019   Acute sinusitis 09/20/2018   Galactorrhea not associated with childbirth 08/18/2018   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   Premenstrual dysphoric disorder 08/18/2018   Ehlers-Danlos syndrome, unspecified 07/13/2018   Constipation 05/09/2018   Abdominal pain, chronic, generalized 04/20/2018   Abnormal laboratory test result 04/12/2018   Defecation urgency 04/12/2018   Diarrhea 04/12/2018   Early satiety 04/12/2018   Fungal esophagitis 04/12/2018   Hematochezia 04/12/2018   Poor sleep 04/12/2018   Right lower quadrant guarding 04/12/2018   Weight loss, non-intentional 04/12/2018   Elevated IgE level 03/16/2018   Hypotension and tachycardia 02/01/2018   Perennial and seasonal allergic rhinitis 01/18/2018   Severe persistent asthma without complication 01/18/2018   Food allergy  01/18/2018   Tension headache 06/05/2014   Migraine without aura and without status migrainosus, not intractable 06/05/2014   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 Authorizing Provider  Acetaminophen  (TYLENOL  EXTRA STRENGTH PO) Take 2 tablets by mouth daily.    [provider]  albuterol  (VENTOLIN  HFA) 108 (90 Base) MCG/ACT inhaler USE 2 INHALATIONS BY MOUTH  INTO THE LUNGS EVERY 4  HOURS AS NEEDED FOR  WHEEZING 01/13/21   Luke Orlan HERO, DO  ALPRAZolam  (XANAX ) 0.25 MG tablet Take 0.125 mg by mouth 3 (three) times daily as needed (for anxiety/panic attacks.).     [provider]  aspirin  81 MG chewable tablet Chew 1 tablet (81 mg total) by mouth daily. 06/16/23   Lenard Calin, MD  atorvastatin  (LIPITOR) 10 MG tablet Take 1 tablet (10 mg total) by mouth daily. 12/02/23 03/01/24  Kate Lonni CROME, MD  budesonide -formoterol  (SYMBICORT ) 160-4.5 MCG/ACT inhaler USE 2 INHALATIONS BY MOUTH TWICE DAILY with spacer and rinse mouth afterwards. Patient taking differently: Inhale 2 puffs into the lungs in the morning and at bedtime. 11/19/20   Luke Orlan HERO, DO  Carbinoxamine  Maleate 4 MG TABS Take 1 tablet (4 mg total) by mouth every 6 (six) hours as needed. Patient taking differently: Take 4 mg by mouth every 6 (six) hours as needed (allergies). 11/19/20   Luke Orlan HERO, DO  cyclobenzaprine (FLEXERIL) 5 MG tablet Take 5 mg by mouth as needed (period cramps). 06/17/23   [provider]  Drospirenone (SLYND) 4 MG TABS Take 1 tablet by mouth daily. 06/22/23   [provider]  fluticasone  (FLONASE ) 50 MCG/ACT nasal spray Place 1 spray into both nostrils 2 (two) times daily as needed for allergies or rhinitis. Patient taking differently: Place 1 spray into both nostrils daily. 11/19/20   Luke Orlan HERO, DO  hydrOXYzine  (ATARAX /VISTARIL ) 25 MG tablet Take 37.5 mg by mouth daily. 09/20/19   [provider]  Hyoscyamine  Sulfate SL 0.125 MG SUBL Take 0.125 mg by mouth 3 (three) times daily as needed. 09/20/19   [provider]  levothyroxine  (SYNTHROID ) 75 MCG tablet Take 75 mcg by mouth daily. 08/21/19   [provider]  montelukast  (SINGULAIR ) 10 MG tablet Take 1 tablet (10 mg total) by mouth at bedtime. Patient taking differently: Take 10 mg  by mouth daily. 10/30/19   Bobbitt, Elgin Pepper, MD  ondansetron  (ZOFRAN ) 4 MG tablet Take 4 mg by mouth as needed for nausea or vomiting.    [provider]  pantoprazole  (PROTONIX ) 40 MG tablet Take 40 mg by mouth daily. 08/17/19   [provider]                                                                                                                                    Past Surgical History Past Surgical History:  Procedure Laterality Date   HYMENECTOMY     MOUTH SURGERY  TRANSESOPHAGEAL ECHOCARDIOGRAM (CATH LAB) N/A 06/16/2023   Procedure: TRANSESOPHAGEAL ECHOCARDIOGRAM;  Surgeon: Mona Vinie BROCKS, MD;  Location: [facility] INVASIVE CV LAB;  Service: Cardiovascular;  Laterality: N/A;   Family History Family History  Adopted: Yes  Problem Relation Age of Onset   Bipolar disorder Maternal Uncle    Allergic rhinitis Neg Hx    Asthma Neg Hx    Eczema Neg Hx    Urticaria Neg Hx     Social History Social History   Tobacco Use   Smoking status: Never   Smokeless tobacco: Never  Vaping Use   Vaping status: Never Used  Substance Use Topics   Alcohol use: No    Alcohol/week: 0.0 standard drinks of alcohol   Drug use: No   Allergies Ferrlecit [na ferric gluc cplx in sucrose], Escitalopram , Flovent  hfa [fluticasone ], Lactose, Lactose intolerance (gi), Other, Pollen extract, Sertraline, and Coconut (cocos nucifera)  Review of Systems A thorough review of systems was obtained and all systems are negative except as noted in the HPI and PMH.   Physical Exam Vital Signs  I have reviewed the triage vital signs BP 116/75   Pulse 95   Temp 97.8 F (36.6 C)   Resp 17   LMP  (LMP Unknown)   SpO2 100%  Physical Exam Vitals and nursing note reviewed.  Constitutional:      General: She is not in acute distress.    Appearance: Normal appearance.  HENT:     Head: Normocephalic and atraumatic.     Right Ear: External ear normal.     Left Ear: External ear normal.      Nose: Nose normal.     Mouth/Throat:     Mouth: Mucous membranes are moist.  Eyes:     General: Visual field deficit present. No scleral icterus.       Right eye: No discharge.        Left eye: No discharge.     Extraocular Movements: Extraocular movements intact.     Pupils: Pupils are equal, round, and reactive to light.  Cardiovascular:     Rate and Rhythm: Normal rate and regular rhythm.     Pulses: Normal pulses.     Heart sounds: Normal heart sounds.  Pulmonary:     Effort: Pulmonary effort is normal. No respiratory distress.     Breath sounds: Normal breath sounds. No stridor.  Abdominal:     General: Abdomen is flat. There is no distension.     Palpations: Abdomen is soft.     Tenderness: There is no abdominal tenderness.  Musculoskeletal:     Cervical back: No rigidity.     Right lower leg: No edema.     Left lower leg: No edema.  Skin:    General: Skin is warm and dry.     Capillary Refill: Capillary refill takes less than 2 seconds.  Neurological:     Mental Status: She is alert and oriented to person, place, and time.     GCS: GCS eye subscore is 4. GCS verbal subscore is 5. GCS motor subscore is 6.     Cranial Nerves: No dysarthria or facial asymmetry.     Sensory: Sensory deficit present.     Motor: Motor function is intact. No weakness or pronator drift.     Coordination: Coordination is intact. Coordination normal. Finger-Nose-Finger Test normal.     Gait: Gait is intact.     Comments: Right side hemianopia Reduced sensation to right cheek and  right leg  Psychiatric:        Mood and Affect: Mood normal.        Behavior: Behavior normal. Behavior is cooperative.     ED Results and Treatments Labs (all labs ordered are listed, but only abnormal results are displayed) Labs Reviewed  BASIC METABOLIC PANEL WITH GFR - Abnormal; Notable for the following components:      Result Value   CO2 20 (*)    Glucose, Bld 112 (*)    Anion gap 17 (*)    All  other components within normal limits  LIPASE, BLOOD  CBC  D-DIMER, QUANTITATIVE  HCG, SERUM, QUALITATIVE  TROPONIN T, HIGH SENSITIVITY  TROPONIN T, HIGH SENSITIVITY                                                                                                                          Radiology DG Chest 2 View Result Date: 02/29/2024 CLINICAL DATA:  Chest pain. EXAM: CHEST - 2 VIEW COMPARISON:  02/03/2018 FINDINGS: The lungs are clear without focal pneumonia, edema, pneumothorax or pleural effusion. The cardiopericardial silhouette is within normal limits for size. No acute bony abnormality. IMPRESSION: No active cardiopulmonary disease. Electronically Signed   By: Camellia Candle M.D.   On: 02/29/2024 07:45    Pertinent labs & imaging results that were available during my care of the patient were reviewed by me and considered in my medical decision making (see MDM for details).  Medications Ordered in ED Medications  diazepam  (VALIUM ) tablet 10 mg (has no administration in time range)                                                                                                                                     Procedures Procedures  (including critical care time)  Medical Decision Making / ED Course    Medical Decision Making:    Corisa Montini is a 23 y.o. 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 The complaint involves an extensive differential diagnosis and also carries with it a high risk of complications and morbidity.  Serious etiology was considered. Ddx includes but is not limited to: Differential includes all life-threatening causes for chest pain. This includes but is not  exclusive to acute coronary syndrome, aortic dissection, pulmonary embolism, cardiac tamponade,  community-acquired pneumonia, pericarditis, musculoskeletal chest wall pain, etc.    Complete initial physical exam performed, notably the patient was in no acute distress, resting comfortably.    Reviewed and confirmed nursing documentation for past medical history, family history, social history.  Vital signs reviewed.    Chest pain/cramping> - chest cramping, atypical in nature, trop neg, EKG non-ischemic, cxr stable; HEART score is low   Facial heaviness Right cheek numbness> - Prior CVA, medial left occipital w/ residual hemianopia, right leg sensation change. She has reduced right sided facial sensation today.  - NIHSS 3 at baseline, she has new right cheek reduce sensation today.  No facial weakness, no facial droop.  No new visual field cuts, no speech deficits, no weakness of extremities.  No sensation changes to extremities that are new.  - Possible recrudescence of prior stroke, does not appear to be can for TNK at this time given low NIHSS - spoke with Dr Merrianne, recommend MRI  - send pt to [facility] with mother for MRI, if this is negative pt can likely follow up with her o/p neurologist. Likely needs hypercoag w/u if not already done.    Clinical Course as of 02/29/24 1000  Tue Feb 29, 2024  0754 NIHSS 3, she has chronic hemianopia and RLE sensation deficit, right facial deficit to v2 is new per pt. NIHSS today is unchanged from baseline [SG]    Clinical Course User Index [SG] Elnor Jayson LABOR, DO                    Additional history obtained: -Additional history obtained from mother -External records from outside source obtained and reviewed including: Chart review including previous notes, labs, imaging, consultation notes including  Prior admit Prior labs/imaging   Lab Tests: -I ordered, reviewed, and interpreted labs.   The pertinent results include:   Labs Reviewed  BASIC METABOLIC PANEL WITH GFR - Abnormal; Notable for the following components:       Result Value   CO2 20 (*)    Glucose, Bld 112 (*)    Anion gap 17 (*)    All other components within normal limits  LIPASE, BLOOD  CBC  D-DIMER, QUANTITATIVE  HCG, SERUM, QUALITATIVE  TROPONIN T, HIGH SENSITIVITY  TROPONIN T, HIGH SENSITIVITY    Notable for labs stable  EKG   EKG Interpretation Date/Time:  Tuesday February 29 2024 06:52:55 EDT Ventricular Rate:  90 PR Interval:  125 QRS Duration:  76 QT Interval:  342 QTC Calculation: 419 R Axis:   61  Text Interpretation: Sinus rhythm Interpretation limited secondary to artifact Confirmed by Elnor Jayson ([id number]) on 02/29/2024 8:32:20 AM         Imaging Studies ordered: I ordered imaging studies including MRI brain w/o   Medicines ordered and prescription drug management: Meds ordered this encounter  Medications   DISCONTD: diazepam  (VALIUM ) tablet 5 mg   diazepam  (VALIUM ) tablet 10 mg    -I have reviewed the patients home medicines and have made adjustments as needed   Consultations Obtained: I requested consultation with the neuro dr merrianne,  and discussed lab and imaging findings as well as pertinent plan - they recommend: MRI   Cardiac Monitoring: The patient was maintained on a cardiac monitor.  I personally viewed and interpreted the cardiac monitored which showed an underlying rhythm of: nsr Continuous pulse oximetry interpreted by myself, 100% on ra.  Social Determinants of Health:  Diagnosis or treatment significantly limited by social determinants of health: na   Reevaluation: After the interventions noted above, I reevaluated the patient and found that they have improved  Co morbidities that complicate the patient evaluation  Past Medical History:  Diagnosis Date   ADHD    Allergy     Anemia    reports history of slight anemia   Anxiety    Asthma    Coronary artery fistula    by echo per note 12/23/16 at [facility]   Depression    Ehlers-Danlos syndrome    diagnosed x36month  ago   GERD (gastroesophageal reflux disease)    H. pylori infection    Heart murmur    Hyperprolactinemia (HCC)    Hypothyroidism    Iron  deficiency anemia due to chronic blood loss 07/27/2023   Menorrhagia 07/27/2023   Other atopic dermatitis 11/19/2020   POTS (postural orthostatic tachycardia syndrome)    Urticaria    Vision abnormalities       Dispostion: Disposition decision including need for hospitalization was considered, and patient transferred.    Final Clinical Impression(s) / ED Diagnoses Final diagnoses:  Atypical chest pain  Right facial numbness        Elnor Jayson LABOR, DO 02/29/24 1000

## 2024-02-29 NOTE — ED Notes (Signed)
 Pt states that she has had Ativan  in the past for a MRI and it caused her to have hallucinations. MD notified.

## 2024-02-29 NOTE — ED Notes (Signed)
 Pt c/o cramping in between breasts.  Vomited x 1 no radiation Neuro intact.  Cva in the past affected her vision only

## 2024-02-29 NOTE — ED Notes (Signed)
 Pt states that she feels as if her anxiety has improved a little. MRI tech notified.

## 2024-02-29 NOTE — ED Notes (Signed)
 ED TO INPATIENT HANDOFF REPORT  ED Nurse Name and Phone #: Douds, MSN, RN, [place] [phone]  S Name/Age/Gender Christine [name] 23 y.o. female Room/Bed: MHOTF/OTF  Code Status   Code Status: Prior  Home/SNF/Other [facility] ED Patient oriented to: self, place, time, and situation Is this baseline? Yes   Triage Complete: Triage complete  Chief Complaint CHEST  CRAMPING,   FACE FEELS HEAVY  Triage Note Pt reports centralized chest cramping that started around 5 am this morning. Pain associated with nausea, vomiting, and face heaviness. No SOB. Hx Stroke November 2024 - cause unknown. Complaint with daily 81 mg ASA.    Allergies Allergies  Allergen Reactions   Ferrlecit [Na Ferric Gluc Cplx In Sucrose] Hives, Nausea Only and Swelling    Hives, burning to feet,ankles, swelling to hands feet, ankles. All over joint pain, nausea   Escitalopram      Other Reaction(s): Eye Dialations   Flovent  Hfa [Fluticasone ] Diarrhea   Lactose Nausea And Vomiting    Lactose intolerance   Lactose Intolerance (Gi) Other (See Comments)    UNSPECIFIED SEVERITY/REACTION   Other Other (See Comments)    PET DANDER SEASONAL ALLERGIES ASTHMA TREE NUTS >> RASH   Pollen Extract Other (See Comments)    UNSPECIFIED REACTION    Sertraline     Other Reaction(s): Increase Depression   Coconut (Cocos Nucifera) Rash    Level of Care/Admitting Diagnosis ED Disposition     ED Disposition  Transfer via POV   Condition  --   Comment  The patient appears reasonably stabilized for transfer considering the current resources, flow, and capabilities available in the ED at this time, and I doubt any other [facility] requiring further screening and/or treatment in the ED prior to transfer is p resent.          B Medical/Surgery History Past Medical History:  Diagnosis Date   ADHD    Allergy     Anemia    reports history of slight anemia   Anxiety    Asthma    Coronary artery fistula     by echo per note 12/23/16 at [facility]   Depression    Ehlers-Danlos syndrome    diagnosed x19month ago   GERD (gastroesophageal reflux disease)    H. pylori infection    Heart murmur    Hyperprolactinemia (HCC)    Hypothyroidism    Iron  deficiency anemia due to chronic blood loss 07/27/2023   Menorrhagia 07/27/2023   Other atopic dermatitis 11/19/2020   POTS (postural orthostatic tachycardia syndrome)    Urticaria    Vision abnormalities    Past Surgical History:  Procedure Laterality Date   HYMENECTOMY     MOUTH SURGERY     TRANSESOPHAGEAL ECHOCARDIOGRAM (CATH LAB) N/A 06/16/2023   Procedure: TRANSESOPHAGEAL ECHOCARDIOGRAM;  Surgeon: Mona Vinie BROCKS, MD;  Location: [facility] INVASIVE CV LAB;  Service: Cardiovascular;  Laterality: N/A;     A IV Location/Drains/Wounds Patient Lines/Drains/Airways Status     Active Line/Drains/Airways     Name Placement date Placement time Site Days   Peripheral IV 02/29/24 20 G 1 Left Forearm 02/29/24  0658  Forearm  less than 1            Intake/Output Last 24 hours No intake or output data in the 24 hours ending 02/29/24 1033  Labs/Imaging Results for orders placed or performed during the hospital encounter of 02/29/24 (from the past 48 hours)  Lipase, blood     Status: None  Collection Time: 02/29/24  6:57 AM  Result Value Ref Range   Lipase 24 11 - 51 U/L    Comment: Performed at [facility], [address] [place], [place] [address]  Basic metabolic panel     Status: Abnormal   Collection Time: 02/29/24  6:57 AM  Result Value Ref Range   Sodium 140 135 - 145 mmol/L   Potassium 3.8 3.5 - 5.1 mmol/L   Chloride 103 98 - 111 mmol/L   CO2 20 (L) 22 - 32 mmol/L   Glucose, Bld 112 (H) 70 - 99 mg/dL    Comment: Glucose reference range applies only to samples taken after fasting for at least 8 hours.   BUN 18 6 - 20 mg/dL   Creatinine, Ser 9.06 0.44 - 1.00 mg/dL   Calcium  10.2 8.9 - 10.3 mg/dL   GFR,  Estimated >39 >39 mL/min    Comment: (NOTE) Calculated using the CKD-EPI Creatinine Equation (2021)    Anion gap 17 (H) 5 - 15    Comment: Performed at [facility], [address] [place], [place] [address]  CBC     Status: None   Collection Time: 02/29/24  6:57 AM  Result Value Ref Range   WBC 9.8 4.0 - 10.5 K/uL   RBC 4.11 3.87 - 5.11 MIL/uL   Hemoglobin 12.5 12.0 - 15.0 g/dL   HCT 62.6 63.9 - 53.9 %   MCV 90.8 80.0 - 100.0 fL   MCH 30.4 26.0 - 34.0 pg   MCHC 33.5 30.0 - 36.0 g/dL   RDW 87.2 88.4 - 84.4 %   Platelets 337 150 - 400 K/uL   nRBC 0.0 0.0 - 0.2 %    Comment: Performed at [facility], [address] [place], [place] [address]  Troponin T, High Sensitivity     Status: None   Collection Time: 02/29/24  6:57 AM  Result Value Ref Range   Troponin T High Sensitivity <15 <19 ng/L    Comment: (NOTE) Biotin concentrations > 1000 ng/mL falsely decrease TnT results.  Serial cardiac troponin measurements are suggested.  Refer to the Links section for chest pain algorithms and additional  guidance. Performed at [facility], [address] [place], [place] [address]   D-dimer, quantitative     Status: None   Collection Time: 02/29/24  8:32 AM  Result Value Ref Range   D-Dimer, Quant 0.30 0.00 - 0.50 ug/mL-FEU    Comment: (NOTE) At the manufacturer cut-off value of 0.5 g/mL FEU, this assay has a negative predictive value of 95-100%.This assay is intended for use in conjunction with a clinical pretest probability (PTP) assessment model to exclude pulmonary embolism (PE) and deep venous thrombosis (DVT) in outpatients suspected of PE or DVT. Results should be correlated with clinical presentation. Performed at [facility], [address] [place], [place] [address]   Troponin T, High Sensitivity     Status: None   Collection Time: 02/29/24  8:49 AM  Result Value Ref Range   Troponin T High Sensitivity <15 <19 ng/L     Comment: (NOTE) Biotin concentrations > 1000 ng/mL falsely decrease TnT results.  Serial cardiac troponin measurements are suggested.  Refer to the Links section for chest pain algorithms and additional  guidance. Performed at [facility], [address] [place], [place] [address]    DG Chest 2 View Result Date: 02/29/2024 CLINICAL DATA:  Chest pain. EXAM: CHEST - 2 VIEW COMPARISON:  02/03/2018 FINDINGS: The lungs are clear without focal pneumonia, edema, pneumothorax or pleural effusion. The cardiopericardial silhouette is within normal limits for size. No acute bony abnormality. IMPRESSION: No active cardiopulmonary disease. Electronically Signed   By: Camellia Candle M.D.   On: 02/29/2024 07:45    Pending Labs Unresulted Labs (From admission, onward)     Start     Ordered   02/29/24 1032  hCG, quantitative, pregnancy  Add-on,   STAT        02/29/24 1032            Vitals/Pain Today's Vitals   02/29/24 0930 02/29/24 1000 02/29/24 1009 02/29/24 1010  BP: 104/80 109/79    Pulse: (!) 101 (!) 104    Resp: 14 14    Temp:  97.8 F (36.6 C)    TempSrc:      SpO2: 100% 100%    Weight:    71.7 kg  Height:    5' 5 (1.651 m)  PainSc:   7      Isolation Precautions No active isolations  Medications Medications  diazepam  (VALIUM ) tablet 10 mg (has no administration in time range)    Mobility walks     Focused Assessments Cardiac Assessment Handoff:  Cardiac Rhythm: Normal sinus rhythm No results found for: CKTOTAL, CKMB, CKMBINDEX, TROPONINI Lab Results  Component Value Date   DDIMER 0.30 02/29/2024   Does the Patient currently have chest pain? Yes    R Recommendations: See Admitting Provider Note  Report given to: Ozell Oas RN  Additional Notes: Patient transfer via POV to [facility] ED for MRI.

## 2024-02-29 NOTE — ED Triage Notes (Signed)
 Pt reports centralized chest cramping that started around 5 am this morning. Pain associated with nausea, vomiting, and face heaviness. No SOB. Hx Stroke November 2024 - cause unknown. Complaint with daily 81 mg ASA.

## 2024-02-29 NOTE — ED Notes (Signed)
 Transport here to take pt to MRI, pt states that she does not feel the Valium  has helped her anxiety at all. MD notified.

## 2024-02-29 NOTE — ED Notes (Signed)
 Lab adding on quantitative hcg

## 2024-02-29 NOTE — Discharge Instructions (Addendum)
 Follow-up with your primary care doctor if you have any ongoing chest discomfort.  Make an appointment to have close follow-up with your neurologist within the next 2 weeks.  Return to the emergency room if you have any worsening symptoms.

## 2024-02-29 NOTE — ED Provider Notes (Signed)
 Patient is a 23 year old who presents with right facial numbness.  She has had a prior stroke with right-sided deficits.  She was transferred here from [facility].  She denies any other new symptoms.  Currently she only has then facial numbness but no other new symptoms.  No ongoing chest pain or shortness of breath.  Her MRI was negative for acute abnormalities.  Dr. Lindzen with neurology is good with patient being discharged with close follow-up with her neurologist.  Discussed this with the patient and her mom.  They will follow-up with [facility] neurology within the next 2 weeks.  She was also advised to follow-up with her PCP if her chest pain continues.  Return precautions were given.   Lenor Hollering, MD 02/29/24 1539

## 2024-03-01 ENCOUNTER — Telehealth: Payer: Self-pay | Admitting: Neurology

## 2024-03-01 DIAGNOSIS — I639 Cerebral infarction, unspecified: Secondary | ICD-10-CM

## 2024-03-01 NOTE — Telephone Encounter (Signed)
 Patient's mother Christine [name], stated patient went to emergency room for facial numbness. ER physician recommended to schedule appointment with neurologist.  Mother requesting to switch neurologist at [facility]. Because feel Dr. Onita was not that helpful, did not seem to be listening to what [name] was telling her.  Mother states, want to switch to another stroke physician at [facility]. Would like to switch Dr. Margaret or Dr. Gregg.  Would like to be contacted once physicians come to a decision.

## 2024-03-03 ENCOUNTER — Other Ambulatory Visit: Payer: Self-pay | Admitting: *Deleted

## 2024-03-03 DIAGNOSIS — E785 Hyperlipidemia, unspecified: Secondary | ICD-10-CM

## 2024-03-03 NOTE — Telephone Encounter (Signed)
 Recommend referral to pharmacy lipid clinic given statin intolerance

## 2024-03-14 NOTE — Telephone Encounter (Signed)
 Pt Mother called stating she has not received call about switch Provider .  Pt  Mother is also requesting earlier appt if possible because Pt  Continuously going back and forward to ER.

## 2024-03-15 NOTE — Telephone Encounter (Signed)
 Call to mom, no answer, left message to return call.

## 2024-03-16 NOTE — Telephone Encounter (Signed)
 Mom returned call and stated that she didn't feel that Dr. Onita listened or addressed her concerns. She stated she wanted to see the stroke provider and I advised that was Dr. Rosemarie and also our office policy on transfer of care. She then stated they didn't care for Dr. Rosemarie in the hospital either and they would have already changed practices but are having a hard time getting her records. I advised at this point since there is breech of trust, I will have medical records mail her a copy nad send to requested physician [facility] [address] [place] [phone] (p). Mom appreciative.

## 2024-03-21 ENCOUNTER — Encounter: Payer: Self-pay | Admitting: Neurology

## 2024-04-13 ENCOUNTER — Ambulatory Visit: Payer: Self-pay | Admitting: Cardiology

## 2024-04-13 LAB — LIPID PANEL
Chol/HDL Ratio: 2.9 ratio (ref 0.0–4.4)
Cholesterol, Total: 194 mg/dL (ref 100–199)
HDL: 67 mg/dL (ref >39)
LDL Chol Calc (NIH): 111 mg/dL — ABNORMAL HIGH (ref 0–99)
Triglycerides: 87 mg/dL (ref 0–149)
VLDL Cholesterol Cal: 16 mg/dL (ref 5–40)

## 2024-04-18 NOTE — Progress Notes (Unsigned)
 SABRA

## 2024-04-19 ENCOUNTER — Ambulatory Visit: Attending: Cardiology | Admitting: Pharmacist Clinician (PhC)/ Clinical Pharmacy Specialist

## 2024-04-19 ENCOUNTER — Encounter: Payer: Self-pay | Admitting: Pharmacist Clinician (PhC)/ Clinical Pharmacy Specialist

## 2024-04-19 DIAGNOSIS — E785 Hyperlipidemia, unspecified: Secondary | ICD-10-CM | POA: Insufficient documentation

## 2024-04-19 NOTE — Assessment & Plan Note (Signed)
 Assessment: LDL-C is not currently at goal <70 mg/dL Last LDL-C 888 mg/dL with no medication Intolerant to statins due to side effects Patient has needlephobia with hesitancy towards injectables - oral medications are more appealing Based on LDL and cardiovascular reduction, patient and mother would like to try Leqvio. Dosing schedule is more appealing than Repatha's [employer] approval for both medications. Leqvio might not be covered and would require trial of Repatha first Will submit prior authorization for Leqvio and notify patient once approved  Plan: Start Leqvio once approved Follow up in 3 months with lipid panel

## 2024-04-19 NOTE — Progress Notes (Addendum)
 Patient ID: Christine [name]                 DOB: 2000/08/31                    MRN: [id number]      HPI: Glinda Natzke is a 23 y.o. female patient referred to lipid clinic by Dr. Lonni Nanas. PMH is significant for CVA 2024, Ehlers-Danlos syndrome, coronary artery fistula, hypothyroidism, iron  deficiency anemia, POTS.  Patient was admitted for a CVA 05/2023. Patient has been following up with cardiology post CVA. Patient saw Dr. Nanas 12/02/23. Atorvastatin  10 mg was initiated due to side effects on rosuvastatin . In July 2025, patient had reports of diarrhea on atorvastatin  and Dr. Nanas referred patient to lipid clinic for further management.  Today patient presents to lipid clinic for initial PharmD visit. She is accompanied by her mother. She is not currently taking any cholesterol medication. She confirms her intolerances to statin therapy. Rosuvastatin  caused swelling and redness in her lower extremities. Atorvastatin  caused diarrhea which can be rare, but there is a 7-13% chance of occurrence.   Patient has a very bland diet due to GI issues. She is limited to certain foods. Tries her best to maintain a heart healthy diet as able. Typically sleeps during the day so exercise is limited. When she is up during the night, she will complete jumping jacks, pushes ups, sit ups, etc. She exercises at home when able including walking around the house. Family is moving to a house with stairs and plans to incorporate that into exercise.  Reviewed LDL lowering cholesterol medications. Patient states that she has needlephobia and is hesitant towards injectables. Discussed all medication options available to her. After discussion of administration, side effects, and cardiovascular reduction benefit, patient and mother decided that Leqvio would be the most ideal due to infrequent dosing schedule.   Patient is planning on applying for disability which would change her insurance.  Current insurance and future insurance would require prior authorizations for Leqvio.  Current Medications: not currently on anything Intolerances: rosuvastatin  10 mg (muscle pains, LE edema), atorvastatin  10 mg (diarrhea) Risk Factors: CVA 05/2023 LDL-C goal: <70 mg/dL ApoB goal: < 80  Diet: very bland diet Heavy carbs Boiled chicken  Exercise: jumping jacks, push-ups, exercises in house  Family History:  Family History  Adopted: Yes  Problem Relation Age of Onset   Bipolar disorder Maternal Uncle    Allergic rhinitis Neg Hx    Asthma Neg Hx    Eczema Neg Hx    Urticaria Neg Hx     Social History: not discussed during visit  Labs: Lipid Panel     Component Value Date/Time   CHOL 194 04/12/2024 0808   TRIG 87 04/12/2024 0808   HDL 67 04/12/2024 0808   CHOLHDL 2.9 04/12/2024 0808   CHOLHDL 2.4 06/15/2023 0730   VLDL 29 06/15/2023 0730   LDLCALC 111 (H) 04/12/2024 0808   LABVLDL 16 04/12/2024 0808    Past Medical History:  Diagnosis Date   ADHD    Allergy     Anemia    reports history of slight anemia   Anxiety    Asthma    Coronary artery fistula    by echo per note 12/23/16 at [facility]   Depression    Ehlers-Danlos syndrome    diagnosed x34month ago   GERD (gastroesophageal reflux disease)    H. pylori infection    Heart murmur  Hyperprolactinemia    Hypothyroidism    Iron  deficiency anemia due to chronic blood loss 07/27/2023   Menorrhagia 07/27/2023   Other atopic dermatitis 11/19/2020   POTS (postural orthostatic tachycardia syndrome)    Urticaria    Vision abnormalities     Current Outpatient Medications on File Prior to Visit  Medication Sig Dispense Refill   Acetaminophen  (TYLENOL  EXTRA STRENGTH PO) Take 2 tablets by mouth daily.     albuterol  (VENTOLIN  HFA) 108 (90 Base) MCG/ACT inhaler USE 2 INHALATIONS BY MOUTH  INTO THE LUNGS EVERY 4  HOURS AS NEEDED FOR  WHEEZING 18 g 0   ALPRAZolam  (XANAX ) 0.25 MG tablet Take 0.125 mg by  mouth 3 (three) times daily as needed (for anxiety/panic attacks.).      aspirin  81 MG chewable tablet Chew 1 tablet (81 mg total) by mouth daily. 30 tablet 1   atorvastatin  (LIPITOR) 10 MG tablet Take 1 tablet (10 mg total) by mouth daily. 90 tablet 3   budesonide -formoterol  (SYMBICORT ) 160-4.5 MCG/ACT inhaler USE 2 INHALATIONS BY MOUTH TWICE DAILY with spacer and rinse mouth afterwards. (Patient taking differently: Inhale 2 puffs into the lungs in the morning and at bedtime.) 30.6 g 2   Carbinoxamine  Maleate 4 MG TABS Take 1 tablet (4 mg total) by mouth every 6 (six) hours as needed. (Patient taking differently: Take 4 mg by mouth every 6 (six) hours as needed (allergies).) 270 tablet 2   cyclobenzaprine (FLEXERIL) 5 MG tablet Take 5 mg by mouth as needed (period cramps).     Drospirenone (SLYND) 4 MG TABS Take 1 tablet by mouth daily.     fluticasone  (FLONASE ) 50 MCG/ACT nasal spray Place 1 spray into both nostrils 2 (two) times daily as needed for allergies or rhinitis. (Patient taking differently: Place 1 spray into both nostrils daily.) 48 g 2   hydrOXYzine  (ATARAX /VISTARIL ) 25 MG tablet Take 37.5 mg by mouth daily.     Hyoscyamine  Sulfate SL 0.125 MG SUBL Take 0.125 mg by mouth 3 (three) times daily as needed.     levothyroxine  (SYNTHROID ) 75 MCG tablet Take 75 mcg by mouth daily.     montelukast  (SINGULAIR ) 10 MG tablet Take 1 tablet (10 mg total) by mouth at bedtime. (Patient taking differently: Take 10 mg by mouth daily.) 90 tablet 1   ondansetron  (ZOFRAN ) 4 MG tablet Take 4 mg by mouth as needed for nausea or vomiting.     pantoprazole  (PROTONIX ) 40 MG tablet Take 40 mg by mouth daily.     No current facility-administered medications on file prior to visit.    Allergies  Allergen Reactions   Ferrlecit [Na Ferric Gluc Cplx In Sucrose] Hives, Nausea Only and Swelling    Hives, burning to feet,ankles, swelling to hands feet, ankles. All over joint pain, nausea   Escitalopram      Other  Reaction(s): Eye Dialations   Flovent  Hfa [Fluticasone ] Diarrhea   Lactose Nausea And Vomiting    Lactose intolerance   Lactose Intolerance (Gi) Other (See Comments)    UNSPECIFIED SEVERITY/REACTION   Other Other (See Comments)    PET DANDER SEASONAL ALLERGIES ASTHMA TREE NUTS >> RASH   Pollen Extract Other (See Comments)    UNSPECIFIED REACTION    Sertraline     Other Reaction(s): Increase Depression   Coconut (Cocos Nucifera) Rash    Assessment/Plan:  1. Hyperlipidemia -  Hyperlipidemia Assessment: LDL-C is not currently at goal <70 mg/dL Last LDL-C 888 mg/dL with no medication Intolerant to statins due  to side effects Patient has needlephobia with hesitancy towards injectables - oral medications are more appealing Based on LDL and cardiovascular reduction, patient and mother would like to try Leqvio. Dosing schedule is more appealing than Repatha's [employer] approval for both medications. Leqvio might not be covered and would require trial of Repatha first Will submit prior authorization for Leqvio and notify patient once approved  Plan: Start Leqvio once approved Follow up in 3 months with lipid panel    Thank you,  Jenkins Graces, PharmD PGY1 Pharmacy Resident [phone]  I was with Dr. Graces and patient for entire visit and agree with above assessment and plan.  Amritha Yorke PharmD [facility]

## 2024-04-19 NOTE — Patient Instructions (Addendum)
 Your Results:             Your most recent labs Goal  Total Cholesterol 194 < 200  Triglycerides 87 < 150  HDL (happy/good cholesterol) 67 > 40  LDL (lousy/bad cholesterol 111 < 70   Medication changes:  We will start the process to get Leqvio covered by your insurance.  Once the prior authorization is complete, I will call/send a MyChart message to let you know and confirm pharmacy information.    Lab orders:  We want to repeat labs after 2-3 months.  We will send you a lab order to remind you once we get closer to that time.     Thank you for choosing [facility]

## 2024-04-20 ENCOUNTER — Telehealth: Payer: Self-pay | Admitting: Neurology

## 2024-04-20 NOTE — Addendum Note (Signed)
 Addended by: Nyrah Demos on: 04/20/2024 11:35 AM   Modules accepted: Orders

## 2024-04-20 NOTE — Telephone Encounter (Signed)
 Referral to Neurology faxed to [facility])  [facility]) Phone [phone] [phone]

## 2024-04-20 NOTE — Telephone Encounter (Signed)
Orders Placed This Encounter  Procedures  . Ambulatory referral to Neurology   

## 2024-04-20 NOTE — Telephone Encounter (Signed)
 [facility]  Lonell Haws MD   She is asking that a referral be entered to send her records to [facility] - Lonell Haws MD  [phone] (F) [phone]  If a referral is entered I can go ahead and get it sent out for the patient

## 2024-04-21 ENCOUNTER — Inpatient Hospital Stay: Attending: Hematology & Oncology

## 2024-04-21 ENCOUNTER — Inpatient Hospital Stay (HOSPITAL_BASED_OUTPATIENT_CLINIC_OR_DEPARTMENT_OTHER): Admitting: Family

## 2024-04-21 VITALS — BP 114/79 | HR 112 | Temp 99.1°F | Resp 17 | Ht 65.0 in | Wt 157.8 lb

## 2024-04-21 DIAGNOSIS — D509 Iron deficiency anemia, unspecified: Secondary | ICD-10-CM

## 2024-04-21 DIAGNOSIS — D500 Iron deficiency anemia secondary to blood loss (chronic): Secondary | ICD-10-CM | POA: Diagnosis not present

## 2024-04-21 LAB — CBC WITH DIFFERENTIAL (CANCER CENTER ONLY)
Abs Immature Granulocytes: 0.03 K/uL (ref 0.00–0.07)
Basophils Absolute: 0.1 K/uL (ref 0.0–0.1)
Basophils Relative: 1 %
Eosinophils Absolute: 0.2 K/uL (ref 0.0–0.5)
Eosinophils Relative: 2 %
HCT: 39.4 % (ref 36.0–46.0)
Hemoglobin: 13.2 g/dL (ref 12.0–15.0)
Immature Granulocytes: 0 %
Lymphocytes Relative: 30 %
Lymphs Abs: 2.8 K/uL (ref 0.7–4.0)
MCH: 30.4 pg (ref 26.0–34.0)
MCHC: 33.5 g/dL (ref 30.0–36.0)
MCV: 90.8 fL (ref 80.0–100.0)
Monocytes Absolute: 0.7 K/uL (ref 0.1–1.0)
Monocytes Relative: 7 %
Neutro Abs: 5.7 K/uL (ref 1.7–7.7)
Neutrophils Relative %: 60 %
Platelet Count: 337 K/uL (ref 150–400)
RBC: 4.34 MIL/uL (ref 3.87–5.11)
RDW: 12.4 % (ref 11.5–15.5)
WBC Count: 9.4 K/uL (ref 4.0–10.5)
nRBC: 0 % (ref 0.0–0.2)

## 2024-04-21 LAB — IRON AND IRON BINDING CAPACITY (CC-WL,HP ONLY)
Iron: 99 ug/dL (ref 28–170)
Saturation Ratios: 22 % (ref 10.4–31.8)
TIBC: 441 ug/dL (ref 250–450)
UIBC: 342 ug/dL

## 2024-04-21 LAB — RETICULOCYTES
Immature Retic Fract: 7.4 % (ref 2.3–15.9)
RBC.: 4.32 MIL/uL (ref 3.87–5.11)
Retic Count, Absolute: 55.3 K/uL (ref 19.0–186.0)
Retic Ct Pct: 1.3 % (ref 0.4–3.1)

## 2024-04-21 LAB — FERRITIN: Ferritin: 28 ng/mL (ref 11–307)

## 2024-04-21 NOTE — Progress Notes (Signed)
 Hematology and Oncology Follow Up Visit  Christine [name] [id number] 2000/12/28 23 y.o. 04/21/2024   Principle Diagnosis:  Iron  deficiency anemia  History of stroke (small PFO) - November 2024   Current Therapy:        IV iron  as indicated    Interim History:  [name] is here today with her mom for follow-up. She still notes fatigue.  She has had sinus congestion and drainage with cough. This has caused her some issues sleeping. She is sleeping on an incline. Mild SOB with congestion.  Nausea at times with cough.  No fever, chills, palpitations, abdominal pain or changes in bowel or bladder habits.  Cycle is heavy but with birth control she only has every few months. No other blood loss noted.  No abnormal bruising, no petechiae.  Occasional episodes of chest pain. Cardiology work up negative.  Tingling in her lower extremities comes and goes.  She has mild fluid retention in her lower extremities and chronic discoloration from arch of foot to the toes.  She has been referred to neurology for facial numbness. She states she feels like she has clamps hanging from each cheek.  Appetite is fair and hydration good. Weight is stable at 157 lbs.   ECOG Performance Status: 1 - Symptomatic but completely ambulatory  Medications:  Allergies as of 04/21/2024       Reactions   Ferrlecit [na Ferric Gluc Cplx In Sucrose] Hives, Nausea Only, Swelling   Hives, burning to feet,ankles, swelling to hands feet, ankles. All over joint pain, nausea   Escitalopram     Other Reaction(s): Eye Dialations   Flovent  Hfa [fluticasone ] Diarrhea   Lactose Nausea And Vomiting   Lactose intolerance   Lactose Intolerance (gi) Other (See Comments)   UNSPECIFIED SEVERITY/REACTION   Other Other (See Comments)   PET DANDER SEASONAL ALLERGIES ASTHMA TREE NUTS >> RASH   Pollen Extract Other (See Comments)   UNSPECIFIED REACTION    Sertraline    Other Reaction(s): Increase Depression   Coconut  (cocos Nucifera) Rash        Medication List        Accurate as of April 21, 2024  8:51 AM. If you have any questions, ask your nurse or doctor.          albuterol  108 (90 Base) MCG/ACT inhaler Commonly known as: VENTOLIN  HFA USE 2 INHALATIONS BY MOUTH  INTO THE LUNGS EVERY 4  HOURS AS NEEDED FOR  WHEEZING   ALPRAZolam  0.25 MG tablet Commonly known as: XANAX  Take 0.125 mg by mouth 3 (three) times daily as needed (for anxiety/panic attacks.).   Aspirin  Low Dose 81 MG chewable tablet Generic drug: aspirin  Chew 1 tablet (81 mg total) by mouth daily.   atorvastatin  10 MG tablet Commonly known as: LIPITOR Take 1 tablet (10 mg total) by mouth daily.   budesonide -formoterol  160-4.5 MCG/ACT inhaler Commonly known as: Symbicort  USE 2 INHALATIONS BY MOUTH TWICE DAILY with spacer and rinse mouth afterwards. What changed:  how much to take how to take this when to take this additional instructions   Carbinoxamine  Maleate 4 MG Tabs Take 1 tablet (4 mg total) by mouth every 6 (six) hours as needed. What changed: reasons to take this   cyclobenzaprine 5 MG tablet Commonly known as: FLEXERIL Take 5 mg by mouth as needed (period cramps).   fluticasone  50 MCG/ACT nasal spray Commonly known as: FLONASE  Place 1 spray into both nostrils 2 (two) times daily as needed for allergies or  rhinitis. What changed: when to take this   hydrOXYzine  25 MG tablet Commonly known as: ATARAX  Take 37.5 mg by mouth daily.   Hyoscyamine  Sulfate SL 0.125 MG Subl Take 0.125 mg by mouth 3 (three) times daily as needed.   levothyroxine  75 MCG tablet Commonly known as: SYNTHROID  Take 75 mcg by mouth daily.   montelukast  10 MG tablet Commonly known as: SINGULAIR  Take 1 tablet (10 mg total) by mouth at bedtime. What changed: when to take this   ondansetron  4 MG tablet Commonly known as: ZOFRAN  Take 4 mg by mouth as needed for nausea or vomiting.   pantoprazole  40 MG tablet Commonly  known as: PROTONIX  Take 40 mg by mouth daily.   Slynd 4 MG Tabs Generic drug: Drospirenone Take 1 tablet by mouth daily.   TYLENOL  EXTRA STRENGTH PO Take 2 tablets by mouth daily.        Allergies:  Allergies  Allergen Reactions   Ferrlecit [Na Ferric Gluc Cplx In Sucrose] Hives, Nausea Only and Swelling    Hives, burning to feet,ankles, swelling to hands feet, ankles. All over joint pain, nausea   Escitalopram      Other Reaction(s): Eye Dialations   Flovent  Hfa [Fluticasone ] Diarrhea   Lactose Nausea And Vomiting    Lactose intolerance   Lactose Intolerance (Gi) Other (See Comments)    UNSPECIFIED SEVERITY/REACTION   Other Other (See Comments)    PET DANDER SEASONAL ALLERGIES ASTHMA TREE NUTS >> RASH   Pollen Extract Other (See Comments)    UNSPECIFIED REACTION    Sertraline     Other Reaction(s): Increase Depression   Coconut (Cocos Nucifera) Rash    Past Medical History, Surgical history, Social history, and Family History were reviewed and updated.  Review of Systems: All other 10 point review of systems is negative.   Physical Exam:  height is 5' 5 (1.651 m) and weight is 157 lb 12.8 oz (71.6 kg). Her oral temperature is 99.1 F (37.3 C). Her blood pressure is 114/79 and her pulse is 112 (abnormal). Her respiration is 17 and oxygen saturation is 100%.   Wt Readings from Last 3 Encounters:  04/21/24 157 lb 12.8 oz (71.6 kg)  02/29/24 158 lb (71.7 kg)  01/19/24 153 lb 12.8 oz (69.8 kg)    Ocular: Sclerae unicteric, pupils equal, round and reactive to light Ear-nose-throat: Oropharynx clear, dentition fair Lymphatic: No cervical or supraclavicular adenopathy Lungs no rales or rhonchi, good excursion bilaterally Heart regular rate and rhythm, no murmur appreciated Abd soft, nontender, positive bowel sounds MSK no focal spinal tenderness, no joint edema Neuro: non-focal, well-oriented, appropriate affect Breasts: Deferred   Lab Results  Component  Value Date   WBC 9.4 04/21/2024   HGB 13.2 04/21/2024   HCT 39.4 04/21/2024   MCV 90.8 04/21/2024   PLT 337 04/21/2024   Lab Results  Component Value Date   FERRITIN 36 01/19/2024   IRON  101 01/19/2024   TIBC 409 01/19/2024   UIBC 308 01/19/2024   IRONPCTSAT 25 01/19/2024   Lab Results  Component Value Date   RETICCTPCT 1.3 04/21/2024   RBC 4.32 04/21/2024   No results found for: KPAFRELGTCHN, LAMBDASER, KAPLAMBRATIO No results found for: IGGSERUM, IGA, IGMSERUM No results found for: TOTALPROTELP, ALBUMINELP, A1GS, A2GS, BETS, BETA2SER, GAMS, MSPIKE, SPEI   Chemistry      Component Value Date/Time   NA 140 02/29/2024 0657   NA 142 11/11/2023 1255   K 3.8 02/29/2024 0657   CL 103 02/29/2024 0657  CO2 20 (L) 02/29/2024 0657   BUN 18 02/29/2024 0657   BUN 11 11/11/2023 1255   CREATININE 0.93 02/29/2024 0657      Component Value Date/Time   CALCIUM  10.2 02/29/2024 0657   ALKPHOS 82 11/11/2023 1255   AST 27 11/11/2023 1255   ALT 18 11/11/2023 1255   BILITOT 0.2 11/11/2023 1255       Impression and Plan: Ms. Kerce is a pleasant 23 yo caucasian female with IDA.  Iron  studies for today are pending. We will replace again if needed.  Follow-up in 3 months.   Lauraine Pepper, NP 9/26/20258:51 AM

## 2024-04-26 ENCOUNTER — Telehealth: Payer: Self-pay | Admitting: Pharmacy Technician

## 2024-04-26 ENCOUNTER — Other Ambulatory Visit: Payer: Self-pay | Admitting: Pharmacist Clinician (PhC)/ Clinical Pharmacy Specialist

## 2024-04-26 NOTE — Telephone Encounter (Signed)
 Christine [name], Patient will be scheduled as soon as possible.  Auth Submission: APPROVED Site of care: Site of care: [facility] Payer: [employer] DUAL Medication & CPT/J Code(s) submitted: Leqvio (Inclisiran) [id number] Diagnosis Code:  Route of submission (phone, fax, portal):  Phone # Fax # Auth type: Buy/Bill PB Units/visits requested: X3 DOSES Reference number: [id number] Approval from: 04/26/24 to 04/26/25

## 2024-04-28 ENCOUNTER — Encounter: Payer: Self-pay | Admitting: Pharmacist Clinician (PhC)/ Clinical Pharmacy Specialist

## 2024-05-31 ENCOUNTER — Ambulatory Visit

## 2024-05-31 VITALS — BP 112/78 | HR 104 | Temp 98.8°F | Resp 18 | Ht 65.0 in | Wt 161.8 lb

## 2024-05-31 DIAGNOSIS — E785 Hyperlipidemia, unspecified: Secondary | ICD-10-CM

## 2024-05-31 MED ORDER — INCLISIRAN SODIUM 284 MG/1.5ML ~~LOC~~ SOSY
284.0000 mg | PREFILLED_SYRINGE | Freq: Once | SUBCUTANEOUS | Status: AC
  Administered 2024-05-31: 284 mg via SUBCUTANEOUS
  Filled 2024-05-31: qty 1.5

## 2024-05-31 NOTE — Progress Notes (Signed)
 Diagnosis: Hyperlipidemia  Provider:  Mannam, Praveen MD  Procedure: Injection  Leqvio  (inclisiran), Dose: 284 mg, Site: subcutaneous, Number of injections: 1  Injection Site(s): Left arm  Post Care: Observation period completed  Discharge: Condition: Good, Destination: Home . AVS Provided  Performed by:  Rachelle Bue, RN

## 2024-06-06 ENCOUNTER — Ambulatory Visit: Admitting: Cardiology

## 2024-07-25 ENCOUNTER — Other Ambulatory Visit: Payer: Self-pay

## 2024-07-25 ENCOUNTER — Encounter: Payer: Self-pay | Admitting: Family

## 2024-07-25 ENCOUNTER — Inpatient Hospital Stay: Attending: Hematology & Oncology

## 2024-07-25 ENCOUNTER — Inpatient Hospital Stay (HOSPITAL_BASED_OUTPATIENT_CLINIC_OR_DEPARTMENT_OTHER): Admitting: Family

## 2024-07-25 VITALS — BP 105/70 | HR 90 | Temp 98.6°F | Resp 20 | Ht 65.0 in | Wt 174.0 lb

## 2024-07-25 DIAGNOSIS — D509 Iron deficiency anemia, unspecified: Secondary | ICD-10-CM | POA: Insufficient documentation

## 2024-07-25 DIAGNOSIS — Z8673 Personal history of transient ischemic attack (TIA), and cerebral infarction without residual deficits: Secondary | ICD-10-CM | POA: Insufficient documentation

## 2024-07-25 DIAGNOSIS — D500 Iron deficiency anemia secondary to blood loss (chronic): Secondary | ICD-10-CM

## 2024-07-25 LAB — RETICULOCYTES
Immature Retic Fract: 7.5 % (ref 2.3–15.9)
RBC.: 4.11 MIL/uL (ref 3.87–5.11)
Retic Count, Absolute: 36.2 K/uL (ref 19.0–186.0)
Retic Ct Pct: 0.9 % (ref 0.4–3.1)

## 2024-07-25 LAB — CBC WITH DIFFERENTIAL (CANCER CENTER ONLY)
Abs Immature Granulocytes: 0.02 K/uL (ref 0.00–0.07)
Basophils Absolute: 0.1 K/uL (ref 0.0–0.1)
Basophils Relative: 1 %
Eosinophils Absolute: 0.2 K/uL (ref 0.0–0.5)
Eosinophils Relative: 2 %
HCT: 36.8 % (ref 36.0–46.0)
Hemoglobin: 12.3 g/dL (ref 12.0–15.0)
Immature Granulocytes: 0 %
Lymphocytes Relative: 30 %
Lymphs Abs: 2.6 K/uL (ref 0.7–4.0)
MCH: 30.1 pg (ref 26.0–34.0)
MCHC: 33.4 g/dL (ref 30.0–36.0)
MCV: 90 fL (ref 80.0–100.0)
Monocytes Absolute: 0.6 K/uL (ref 0.1–1.0)
Monocytes Relative: 7 %
Neutro Abs: 5.1 K/uL (ref 1.7–7.7)
Neutrophils Relative %: 60 %
Platelet Count: 295 K/uL (ref 150–400)
RBC: 4.09 MIL/uL (ref 3.87–5.11)
RDW: 13.2 % (ref 11.5–15.5)
WBC Count: 8.6 K/uL (ref 4.0–10.5)
nRBC: 0 % (ref 0.0–0.2)

## 2024-07-25 LAB — IRON AND IRON BINDING CAPACITY (CC-WL,HP ONLY)
Iron: 135 ug/dL (ref 28–170)
Saturation Ratios: 32 % — ABNORMAL HIGH (ref 10.4–31.8)
TIBC: 428 ug/dL (ref 250–450)
UIBC: 293 ug/dL

## 2024-07-25 LAB — FERRITIN: Ferritin: 33 ng/mL (ref 11–307)

## 2024-07-25 NOTE — Progress Notes (Signed)
 " Hematology and Oncology Follow Up Visit  Christine [name] [id number] 02-11-2001 23 y.o. 07/25/2024   Principle Diagnosis:  Iron  deficiency anemia  History of stroke (small PFO) - November 2024   Current Therapy:        IV iron  as indicated    Interim History:  Christine [name] is here today for follow-up. She is doing fairly well. She has chronic fatigue which is bothersome at times.  No abnormal blood loss noted. She has one cycle a month on birth control skipping her placebo week. She has occasional slight spotting around her cycle.  No fever, chills, n/v, cough, rash, chest pain, abdominal pain or changes in bowel or bladder habits.  Some SOB with asthma exacerbation.  Occasional palpitations and dizziness with POTS unchanged from baseline.  Chronic swelling in her lower extremities stable. No pitting edema at this time.  Numbness and tingling in her lower extremities suspicious for neuropathy but she has not been back yet to see neurology for formal diagnosis yet.  No falls or syncope reported.  Appetite and hydration are good. She eats mostly liquids and some small amounts of meat in liquid. Weight stable at 1754 lbs.   ECOG Performance Status: 1 - Symptomatic but completely ambulatory  Medications:  Allergies as of 07/25/2024       Reactions   Ferrlecit [na Ferric Gluc Cplx In Sucrose] Hives, Nausea Only, Swelling   Hives, burning to feet,ankles, swelling to hands feet, ankles. All over joint pain, nausea   Escitalopram     Other Reaction(s): Eye Dialations   Flovent  Hfa [fluticasone ] Diarrhea   Lactose Nausea And Vomiting   Lactose intolerance   Lactose Intolerance (gi) Other (See Comments)   UNSPECIFIED SEVERITY/REACTION   Other Other (See Comments)   PET DANDER SEASONAL ALLERGIES ASTHMA TREE NUTS >> RASH   Pollen Extract Other (See Comments)   UNSPECIFIED REACTION    Sertraline    Other Reaction(s): Increase Depression   Coconut (cocos Nucifera) Rash         Medication List        Accurate as of July 25, 2024  8:49 AM. If you have any questions, ask your nurse or doctor.          albuterol  108 (90 Base) MCG/ACT inhaler Commonly known as: VENTOLIN  HFA USE 2 INHALATIONS BY MOUTH  INTO THE LUNGS EVERY 4  HOURS AS NEEDED FOR  WHEEZING   ALPRAZolam  0.25 MG tablet Commonly known as: XANAX  Take 0.125 mg by mouth 3 (three) times daily as needed (for anxiety/panic attacks.).   Aspirin  Low Dose 81 MG chewable tablet Generic drug: aspirin  Chew 1 tablet (81 mg total) by mouth daily.   atorvastatin  10 MG tablet Commonly known as: LIPITOR Take 1 tablet (10 mg total) by mouth daily.   budesonide -formoterol  160-4.5 MCG/ACT inhaler Commonly known as: Symbicort  USE 2 INHALATIONS BY MOUTH TWICE DAILY with spacer and rinse mouth afterwards. What changed:  how much to take how to take this when to take this additional instructions   Carbinoxamine  Maleate 4 MG Tabs Take 1 tablet (4 mg total) by mouth every 6 (six) hours as needed. What changed: reasons to take this   cyclobenzaprine 5 MG tablet Commonly known as: FLEXERIL Take 5 mg by mouth as needed (period cramps).   fluticasone  50 MCG/ACT nasal spray Commonly known as: FLONASE  Place 1 spray into both nostrils 2 (two) times daily as needed for allergies or rhinitis. What changed: when to take this  hydrOXYzine  25 MG tablet Commonly known as: ATARAX  Take 37.5 mg by mouth daily.   Hyoscyamine  Sulfate SL 0.125 MG Subl Take 0.125 mg by mouth 3 (three) times daily as needed.   levothyroxine  75 MCG tablet Commonly known as: SYNTHROID  Take 75 mcg by mouth daily.   montelukast  10 MG tablet Commonly known as: SINGULAIR  Take 1 tablet (10 mg total) by mouth at bedtime. What changed: when to take this   ondansetron  4 MG tablet Commonly known as: ZOFRAN  Take 4 mg by mouth as needed for nausea or vomiting.   pantoprazole  40 MG tablet Commonly known as: PROTONIX  Take 40 mg  by mouth daily.   Slynd 4 MG Tabs Generic drug: Drospirenone Take 1 tablet by mouth daily.   TYLENOL  EXTRA STRENGTH PO Take 2 tablets by mouth daily.        Allergies: Allergies[1]  Past Medical History, Surgical history, Social history, and Family History were reviewed and updated.  Review of Systems: All other 10 point review of systems is negative.   Physical Exam:  vitals were not taken for this visit.   Wt Readings from Last 3 Encounters:  05/31/24 161 lb 12.8 oz (73.4 kg)  04/21/24 157 lb 12.8 oz (71.6 kg)  02/29/24 158 lb (71.7 kg)    Ocular: Sclerae unicteric, pupils equal, round and reactive to light Ear-nose-throat: Oropharynx clear, dentition fair Lymphatic: No cervical or supraclavicular adenopathy Lungs no rales or rhonchi, good excursion bilaterally Heart regular rate and rhythm, no murmur appreciated Abd soft, nontender, positive bowel sounds MSK no focal spinal tenderness, no joint edema Neuro: non-focal, well-oriented, appropriate affect Breasts: Deferred   Lab Results  Component Value Date   WBC 9.4 04/21/2024   HGB 13.2 04/21/2024   HCT 39.4 04/21/2024   MCV 90.8 04/21/2024   PLT 337 04/21/2024   Lab Results  Component Value Date   FERRITIN 28 04/21/2024   IRON  99 04/21/2024   TIBC 441 04/21/2024   UIBC 342 04/21/2024   IRONPCTSAT 22 04/21/2024   Lab Results  Component Value Date   RETICCTPCT 0.9 07/25/2024   RBC 4.11 07/25/2024   No results found for: KPAFRELGTCHN, LAMBDASER, KAPLAMBRATIO No results found for: IGGSERUM, IGA, IGMSERUM No results found for: STEPHANY CARLOTA BENSON MARKEL EARLA JOANNIE DOC VICK, SPEI   Chemistry      Component Value Date/Time   NA 140 02/29/2024 0657   NA 142 11/11/2023 1255   K 3.8 02/29/2024 0657   CL 103 02/29/2024 0657   CO2 20 (L) 02/29/2024 0657   BUN 18 02/29/2024 0657   BUN 11 11/11/2023 1255   CREATININE 0.93 02/29/2024 0657      Component  Value Date/Time   CALCIUM  10.2 02/29/2024 0657   ALKPHOS 82 11/11/2023 1255   AST 27 11/11/2023 1255   ALT 18 11/11/2023 1255   BILITOT 0.2 11/11/2023 1255       Impression and Plan: Christine [name] is a pleasant 23 yo caucasian female with IDA.  Iron  studies for today are pending. We will replace again if needed.  Follow-up in 4 months.   Lauraine Pepper, NP 12/30/20258:49 AM     [1]  Allergies Allergen Reactions   Ferrlecit [Na Ferric Gluc Cplx In Sucrose] Hives, Nausea Only and Swelling    Hives, burning to feet,ankles, swelling to hands feet, ankles. All over joint pain, nausea   Escitalopram      Other Reaction(s): Eye Dialations   Flovent  Hfa [Fluticasone ] Diarrhea   Lactose Nausea And Vomiting  Lactose intolerance   Lactose Intolerance (Gi) Other (See Comments)    UNSPECIFIED SEVERITY/REACTION   Other Other (See Comments)    PET DANDER SEASONAL ALLERGIES ASTHMA TREE NUTS >> RASH   Pollen Extract Other (See Comments)    UNSPECIFIED REACTION    Sertraline     Other Reaction(s): Increase Depression   Coconut (Cocos Nucifera) Rash   "

## 2024-08-04 ENCOUNTER — Ambulatory Visit: Admitting: Cardiology

## 2024-08-07 NOTE — Progress Notes (Unsigned)
 " Cardiology Office Note:    Date:  08/07/2024   ID:  Christine [name], DOB Aug 28, 2000, MRN [id number]  PCP:  Burney Darice CROME, MD  Cardiologist:  Lonni CROME Nanas, MD  Electrophysiologist:  None   Referring MD: Burney Darice CROME, MD   No chief complaint on file.   History of Present Illness:    Christine [name] is a 24 y.o. female with a hx of CVA, Ehlers-Danlos syndrome, coronary artery fistula, hypothyroidism, iron  deficiency anemia, POTS who presents for follow-up.  She was referred by Dr. Burney for evaluation of CVA, initially seen 08/23/2023.  She was admitted 05/2023 with acute CVA.  She presented with right hemianopsia.  MRI showed acute ischemic infarct in the left medial occipital lobe.  TEE showed PFO that was discussed with structural team and not felt to be significant.  She was started on aspirin  81 mg daily.  It was recommended that she discuss with her OB/GYN changing her birth control as she was high risk for recurrent stroke.  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  Echocardiogram at [facility] in 2015 comments on small coronary cameral fistula, appears to drain to main pulmonary artery.  Zio patch x 12 days 08/2023 shows no significant arrhythmias.  Coronary CTA 10/06/2023 showed normal coronary arteries, no fistula seen.  Since last clinic visit,  she reports the swelling and redness in her legs is improving, was previously down her entire legs but now just in feet.  Denies any chest pain, dyspnea, lightheadedness, syncope.  Reports palpitations have improved.   Past Medical History:  Diagnosis Date   ADHD    Allergy     Anemia    reports history of slight anemia   Anxiety    Asthma    Coronary artery fistula    by echo per note 12/23/16 at [facility]   Depression    Ehlers-Danlos syndrome    diagnosed x27month ago   GERD (gastroesophageal reflux disease)    H. pylori infection    Heart murmur    Hyperprolactinemia     Hypothyroidism    Iron  deficiency anemia due to chronic blood loss 07/27/2023   Menorrhagia 07/27/2023   Other atopic dermatitis 11/19/2020   POTS (postural orthostatic tachycardia syndrome)    Urticaria    Vision abnormalities     Past Surgical History:  Procedure Laterality Date   HYMENECTOMY     MOUTH SURGERY     TRANSESOPHAGEAL ECHOCARDIOGRAM (CATH LAB) N/A 06/16/2023   Procedure: TRANSESOPHAGEAL ECHOCARDIOGRAM;  Surgeon: Mona Vinie BROCKS, MD;  Location: [facility] INVASIVE CV LAB;  Service: Cardiovascular;  Laterality: N/A;    Current Medications: No outpatient medications have been marked as taking for the 08/09/24 encounter (Appointment) with Nanas Lonni CROME, MD.     Allergies:   Ferrlecit [na ferric gluc cplx in sucrose], Escitalopram , Flovent  hfa [fluticasone ], Lactose, Lactose intolerance (gi), Other, Pollen extract, Sertraline, and Coconut (cocos nucifera)   Social History   Socioeconomic History   Marital status: Single    Spouse name: Not on file   Number of children: Not on file   Years of education: Not on file   Highest education level: Not on file  Occupational History   Not on file  Tobacco Use   Smoking status: Never   Smokeless tobacco: Never  Vaping Use   Vaping status: Never Used  Substance and Sexual Activity   Alcohol use: No    Alcohol/week: 0.0 standard drinks of alcohol  Drug use: No   Sexual activity: Never    Birth control/protection: Abstinence    Comment: insurance questions declined  Other Topics Concern   Not on file  Social History Narrative   Caffiene 1-2 daily avg.   Working no   Lives mom, 2 cats   Social Drivers of Health   Tobacco Use: Low Risk (07/25/2024)   Patient History    Smoking Tobacco Use: Never    Smokeless Tobacco Use: Never    Passive Exposure: Not on file  Financial Resource Strain: Not on file  Food Insecurity: Not on file  Transportation Needs: Not on file  Physical Activity: Not on file  Stress: Not  on file  Social Connections: Not on file  Depression (EYV7-0): Low Risk (07/25/2024)   Depression (PHQ2-9)    PHQ-2 Score: 0  Alcohol Screen: Not on file  Housing: Not on file  Utilities: Not on file  Health Literacy: Not on file     Family History: The patient's family history includes Bipolar disorder in her maternal uncle. There is no history of Allergic rhinitis, Asthma, Eczema, or Urticaria. She was adopted.  ROS:   Please see the history of present illness.     All other systems reviewed and are negative.  EKGs/Labs/Other Studies Reviewed:    The following studies were reviewed today:   EKG:   08/23/2023: Sinus rhythm, rate 87  Recent Labs: 11/11/2023: ALT 18; BNP 20.2 02/29/2024: BUN 18; Creatinine, Ser 0.93; Potassium 3.8; Sodium 140 07/25/2024: Hemoglobin 12.3; Platelet Count 295  Recent Lipid Panel    Component Value Date/Time   CHOL 194 04/12/2024 0808   TRIG 87 04/12/2024 0808   HDL 67 04/12/2024 0808   CHOLHDL 2.9 04/12/2024 0808   CHOLHDL 2.4 06/15/2023 0730   VLDL 29 06/15/2023 0730   LDLCALC 111 (H) 04/12/2024 0808    Physical Exam:    VS:  There were no vitals taken for this visit.    Wt Readings from Last 3 Encounters:  07/25/24 174 lb (78.9 kg)  05/31/24 161 lb 12.8 oz (73.4 kg)  04/21/24 157 lb 12.8 oz (71.6 kg)     GEN:  Well nourished, well developed in no acute distress HEENT: Normal NECK: No JVD; No carotid bruits CARDIAC: RRR, no murmurs, rubs, gallops RESPIRATORY:  Clear to auscultation without rales, wheezing or rhonchi  ABDOMEN: Soft, non-tender, non-distended MUSCULOSKELETAL: Nonpitting edema, redness in both feet SKIN: Warm and dry NEUROLOGIC:  Alert and oriented x 3 PSYCHIATRIC:  Normal affect   ASSESSMENT:    No diagnosis found.   PLAN:    CVA: She was admitted 05/2023 with acute CVA.  She presented with right hemianopsia.  MRI showed acute ischemic infarct in the left medial occipital lobe.  TEE showed small PFO that  was discussed with structural team and not felt to be significant.  She was started on aspirin  81 mg daily.  It was recommended that she discuss with her OB/GYN changing her birth control as she was high risk for recurrent stroke.   Zio patch x 12 days 08/2023 shows no significant arrhythmias.   -Continue aspirin  - Did not tolerate statins.  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  Echocardiogram at [facility] in 2015 comments on small coronary cameral fistula, appears to drain to main pulmonary artery. Coronary CTA 10/06/2023 showed normal coronary arteries, no fistula seen.  Lower extremity edema: Reports improvement,  nonpitting edema on exam today.  No DVT on duplex 10/2023.  BNP 20, unlikely cardiac etiology.  Albumin 5.1.  Compression stockings recommended  RTC in 6 months***   Medication Adjustments/Labs and Tests Ordered: Current medicines are reviewed at length with the patient today.  Concerns regarding medicines are outlined above.  No orders of the defined types were placed in this encounter.  No orders of the defined types were placed in this encounter.   There are no Patient Instructions on file for this visit.   Signed, Lonni LITTIE Nanas, MD  08/07/2024 5:33 PM    [facility] "

## 2024-08-09 ENCOUNTER — Ambulatory Visit: Admitting: Cardiology

## 2024-08-29 ENCOUNTER — Encounter: Payer: Self-pay | Admitting: Cardiology

## 2024-08-29 ENCOUNTER — Ambulatory Visit: Admitting: Cardiology

## 2024-08-29 VITALS — BP 94/69 | HR 125 | Ht 65.0 in | Wt 179.0 lb

## 2024-08-29 DIAGNOSIS — R600 Localized edema: Secondary | ICD-10-CM | POA: Diagnosis not present

## 2024-08-29 DIAGNOSIS — R000 Tachycardia, unspecified: Secondary | ICD-10-CM

## 2024-08-29 DIAGNOSIS — I2541 Coronary artery aneurysm: Secondary | ICD-10-CM

## 2024-08-29 DIAGNOSIS — R197 Diarrhea, unspecified: Secondary | ICD-10-CM | POA: Diagnosis not present

## 2024-08-29 DIAGNOSIS — I639 Cerebral infarction, unspecified: Secondary | ICD-10-CM | POA: Diagnosis not present

## 2024-08-29 NOTE — Patient Instructions (Signed)
 Medication Instructions:  Your physician recommends that you continue on your current medications as directed. Please refer to the Current Medication list given to you today.  *If you need a refill on your cardiac medications before your next appointment, please call your pharmacy*  Lab Work: CMP, Magnesium, Fasting Lipid Panel If you have labs (blood work) drawn today and your tests are completely normal, you will receive your results only by: MyChart Message (if you have MyChart) OR A paper copy in the mail If you have any lab test that is abnormal or we need to change your treatment, we will call you to review the results.  Testing/Procedures: None ordered   Follow-Up: At [facility], you and your health needs are our priority.  As part of our continuing mission to provide you with exceptional heart care, our providers are all part of one team.  This team includes your primary Cardiologist (physician) and Advanced Practice Providers or APPs (Physician Assistants and Nurse Practitioners) who all work together to provide you with the care you need, when you need it.  Your next appointment:   6 month(s)  Provider:   Lonni LITTIE Nanas, MD    We recommend signing up for the patient portal called MyChart.  Sign up information is provided on this After Visit Summary.  MyChart is used to connect with patients for Virtual Visits (Telemedicine).  Patients are able to view lab/test results, encounter notes, upcoming appointments, etc.  Non-urgent messages can be sent to your provider as well.   To learn more about what you can do with MyChart, go to [email or web address].   Other Instructions  We will have a Licensed Clinical Social Worker to reach out to you.

## 2024-08-30 ENCOUNTER — Ambulatory Visit: Payer: Self-pay | Admitting: Cardiology

## 2024-08-30 ENCOUNTER — Telehealth: Payer: Self-pay | Admitting: Licensed Clinical Social Worker

## 2024-08-30 LAB — COMPREHENSIVE METABOLIC PANEL WITH GFR
ALT: 14 [IU]/L (ref 0–32)
AST: 18 [IU]/L (ref 0–40)
Albumin: 4.6 g/dL (ref 4.0–5.0)
Alkaline Phosphatase: 111 [IU]/L (ref 41–116)
BUN/Creatinine Ratio: 19 (ref 9–23)
BUN: 22 mg/dL — ABNORMAL HIGH (ref 6–20)
Bilirubin Total: 0.2 mg/dL (ref 0.0–1.2)
CO2: 17 mmol/L — ABNORMAL LOW (ref 20–29)
Calcium: 10 mg/dL (ref 8.7–10.2)
Chloride: 103 mmol/L (ref 96–106)
Creatinine, Ser: 1.15 mg/dL — ABNORMAL HIGH (ref 0.57–1.00)
Globulin, Total: 3.2 g/dL (ref 1.5–4.5)
Glucose: 105 mg/dL — ABNORMAL HIGH (ref 70–99)
Potassium: 4.6 mmol/L (ref 3.5–5.2)
Sodium: 138 mmol/L (ref 134–144)
Total Protein: 7.8 g/dL (ref 6.0–8.5)
eGFR: 68 mL/min/{1.73_m2}

## 2024-08-30 LAB — MAGNESIUM: Magnesium: 2.2 mg/dL (ref 1.6–2.3)

## 2024-08-30 LAB — LIPID PANEL
Chol/HDL Ratio: 2.3 ratio (ref 0.0–4.4)
Cholesterol, Total: 146 mg/dL (ref 100–199)
HDL: 63 mg/dL
LDL Chol Calc (NIH): 50 mg/dL (ref 0–99)
Triglycerides: 211 mg/dL — ABNORMAL HIGH (ref 0–149)
VLDL Cholesterol Cal: 33 mg/dL (ref 5–40)

## 2024-08-30 NOTE — Telephone Encounter (Signed)
 H&V Care Navigation CSW Progress Note  Clinical Social Worker contacted patient by phone to f/u on referral. Pt mother answered at only number listed for pt ([phone], DPR on file for mother). Introduced self, role, reason for call, pt mother shares she is not there with her and that pt usually puts her number down for medical concerns. LCSW shared I would be happy to speak with mother since we do have a DPR but that I am interested in speaking with pt as she is an adult and I want to ensure she knows I am speaking about her/address any concerns she may have as well. Pt mother agreeable, provided me pt cell number [phone]. I called that number and left voicemail. Will re-attempt again as able.  Patient is participating in a Managed Medicaid Plan:  No, UHC commercial plan, likely medicaid eligible if not employed currently/no income  SDOH Screenings   Depression (PHQ2-9): Low Risk (07/25/2024)  Tobacco Use: Low Risk (08/29/2024)    Marit Lark, MSW, LCSW Clinical Social Worker II [facility] Heart/Vascular Care Navigation  [phone]- work cell phone (preferred)

## 2024-08-31 ENCOUNTER — Telehealth (HOSPITAL_BASED_OUTPATIENT_CLINIC_OR_DEPARTMENT_OTHER): Payer: Self-pay | Admitting: Licensed Clinical Social Worker

## 2024-08-31 NOTE — Telephone Encounter (Signed)
 H&V Care Navigation CSW Progress Note  Clinical Social Worker received a call from pt mother to inquire if we could speak Wednesday 2/10 with her and pt present. Confirmed that would be just fine, she will call after her other appt on 2/10, likely around noon. Shared I would be available as long as no emergent needs arose for another pt, if I do not answer at first encouraged her to leave a voicemail and I will return as soon as I can.    Patient is participating in a Managed Medicaid Plan:  No, UHC commercial plan  SDOH Screenings   Depression (PHQ2-9): Low Risk (07/25/2024)  Tobacco Use: Low Risk (08/29/2024)     Marit Lark, MSW, LCSW Clinical Social Worker II [facility] Heart/Vascular Care Navigation  [phone]- work cell phone (preferred)

## 2024-09-06 ENCOUNTER — Ambulatory Visit

## 2024-11-21 ENCOUNTER — Inpatient Hospital Stay: Attending: Hematology & Oncology

## 2024-11-21 ENCOUNTER — Inpatient Hospital Stay: Admitting: Family
